# Patient Record
Sex: Male | Born: 1970 | Race: Black or African American | Hispanic: No | Marital: Single | Smoking: Current every day smoker
Health system: Southern US, Community
[De-identification: ages and names within clinical notes are randomized; demographics above are authoritative.]

## PROBLEM LIST (undated history)

## (undated) DIAGNOSIS — F329 Major depressive disorder, single episode, unspecified: Secondary | ICD-10-CM

## (undated) DIAGNOSIS — E1140 Type 2 diabetes mellitus with diabetic neuropathy, unspecified: Secondary | ICD-10-CM

## (undated) DIAGNOSIS — F32A Depression, unspecified: Secondary | ICD-10-CM

## (undated) DIAGNOSIS — F419 Anxiety disorder, unspecified: Secondary | ICD-10-CM

## (undated) DIAGNOSIS — I10 Essential (primary) hypertension: Secondary | ICD-10-CM

## (undated) HISTORY — PX: MOUTH SURGERY: SHX715

## (undated) HISTORY — PX: TYMPANOSTOMY TUBE PLACEMENT: SHX32

---

## 2001-02-11 ENCOUNTER — Emergency Department (HOSPITAL_COMMUNITY): Admission: EM | Admit: 2001-02-11 | Discharge: 2001-02-12 | Payer: Self-pay | Admitting: Emergency Medicine

## 2001-03-08 ENCOUNTER — Emergency Department (HOSPITAL_COMMUNITY): Admission: EM | Admit: 2001-03-08 | Discharge: 2001-03-08 | Payer: Self-pay | Admitting: Emergency Medicine

## 2001-04-26 ENCOUNTER — Emergency Department (HOSPITAL_COMMUNITY): Admission: EM | Admit: 2001-04-26 | Discharge: 2001-04-26 | Payer: Self-pay | Admitting: Emergency Medicine

## 2001-04-26 ENCOUNTER — Encounter: Payer: Self-pay | Admitting: Emergency Medicine

## 2001-05-08 ENCOUNTER — Emergency Department (HOSPITAL_COMMUNITY): Admission: EM | Admit: 2001-05-08 | Discharge: 2001-05-09 | Payer: Self-pay | Admitting: Emergency Medicine

## 2001-09-29 ENCOUNTER — Emergency Department (HOSPITAL_COMMUNITY): Admission: EM | Admit: 2001-09-29 | Discharge: 2001-09-29 | Payer: Self-pay | Admitting: Emergency Medicine

## 2002-03-05 ENCOUNTER — Emergency Department (HOSPITAL_COMMUNITY): Admission: EM | Admit: 2002-03-05 | Discharge: 2002-03-06 | Payer: Self-pay

## 2002-05-27 ENCOUNTER — Emergency Department (HOSPITAL_COMMUNITY): Admission: EM | Admit: 2002-05-27 | Discharge: 2002-05-27 | Payer: Self-pay | Admitting: Emergency Medicine

## 2002-05-30 ENCOUNTER — Emergency Department (HOSPITAL_COMMUNITY): Admission: EM | Admit: 2002-05-30 | Discharge: 2002-05-30 | Payer: Self-pay | Admitting: Emergency Medicine

## 2002-06-18 ENCOUNTER — Encounter: Payer: Self-pay | Admitting: Emergency Medicine

## 2002-06-18 ENCOUNTER — Emergency Department (HOSPITAL_COMMUNITY): Admission: EM | Admit: 2002-06-18 | Discharge: 2002-06-18 | Payer: Self-pay | Admitting: Emergency Medicine

## 2002-07-23 ENCOUNTER — Emergency Department (HOSPITAL_COMMUNITY): Admission: EM | Admit: 2002-07-23 | Discharge: 2002-07-23 | Payer: Self-pay | Admitting: Emergency Medicine

## 2002-09-24 ENCOUNTER — Emergency Department (HOSPITAL_COMMUNITY): Admission: EM | Admit: 2002-09-24 | Discharge: 2002-09-24 | Payer: Self-pay | Admitting: Emergency Medicine

## 2003-01-07 ENCOUNTER — Emergency Department (HOSPITAL_COMMUNITY): Admission: EM | Admit: 2003-01-07 | Discharge: 2003-01-07 | Payer: Self-pay | Admitting: Emergency Medicine

## 2003-02-22 ENCOUNTER — Emergency Department (HOSPITAL_COMMUNITY): Admission: EM | Admit: 2003-02-22 | Discharge: 2003-02-22 | Payer: Self-pay | Admitting: Emergency Medicine

## 2003-02-22 ENCOUNTER — Encounter: Payer: Self-pay | Admitting: Emergency Medicine

## 2003-04-04 ENCOUNTER — Emergency Department (HOSPITAL_COMMUNITY): Admission: EM | Admit: 2003-04-04 | Discharge: 2003-04-04 | Payer: Self-pay | Admitting: Family Medicine

## 2003-06-18 ENCOUNTER — Emergency Department (HOSPITAL_COMMUNITY): Admission: EM | Admit: 2003-06-18 | Discharge: 2003-06-19 | Payer: Self-pay | Admitting: Emergency Medicine

## 2003-07-21 ENCOUNTER — Emergency Department (HOSPITAL_COMMUNITY): Admission: EM | Admit: 2003-07-21 | Discharge: 2003-07-21 | Payer: Self-pay

## 2003-07-30 ENCOUNTER — Emergency Department (HOSPITAL_COMMUNITY): Admission: EM | Admit: 2003-07-30 | Discharge: 2003-07-30 | Payer: Self-pay | Admitting: Emergency Medicine

## 2003-08-05 ENCOUNTER — Emergency Department (HOSPITAL_COMMUNITY): Admission: EM | Admit: 2003-08-05 | Discharge: 2003-08-05 | Payer: Self-pay | Admitting: Emergency Medicine

## 2003-08-19 ENCOUNTER — Emergency Department (HOSPITAL_COMMUNITY): Admission: EM | Admit: 2003-08-19 | Discharge: 2003-08-19 | Payer: Self-pay | Admitting: Emergency Medicine

## 2003-09-24 ENCOUNTER — Emergency Department (HOSPITAL_COMMUNITY): Admission: EM | Admit: 2003-09-24 | Discharge: 2003-09-25 | Payer: Self-pay | Admitting: Emergency Medicine

## 2003-11-20 ENCOUNTER — Emergency Department (HOSPITAL_COMMUNITY): Admission: EM | Admit: 2003-11-20 | Discharge: 2003-11-20 | Payer: Self-pay | Admitting: Emergency Medicine

## 2004-01-01 ENCOUNTER — Emergency Department (HOSPITAL_COMMUNITY): Admission: EM | Admit: 2004-01-01 | Discharge: 2004-01-01 | Payer: Self-pay | Admitting: Emergency Medicine

## 2004-01-26 ENCOUNTER — Emergency Department (HOSPITAL_COMMUNITY): Admission: EM | Admit: 2004-01-26 | Discharge: 2004-01-26 | Payer: Self-pay | Admitting: *Deleted

## 2004-02-07 ENCOUNTER — Emergency Department (HOSPITAL_COMMUNITY): Admission: EM | Admit: 2004-02-07 | Discharge: 2004-02-07 | Payer: Self-pay | Admitting: Emergency Medicine

## 2004-03-04 ENCOUNTER — Emergency Department (HOSPITAL_COMMUNITY): Admission: EM | Admit: 2004-03-04 | Discharge: 2004-03-04 | Payer: Self-pay | Admitting: Emergency Medicine

## 2004-04-15 ENCOUNTER — Emergency Department (HOSPITAL_COMMUNITY): Admission: EM | Admit: 2004-04-15 | Discharge: 2004-04-15 | Payer: Self-pay | Admitting: Emergency Medicine

## 2004-06-02 ENCOUNTER — Emergency Department (HOSPITAL_COMMUNITY): Admission: EM | Admit: 2004-06-02 | Discharge: 2004-06-02 | Payer: Self-pay | Admitting: Emergency Medicine

## 2004-06-23 ENCOUNTER — Emergency Department (HOSPITAL_COMMUNITY): Admission: EM | Admit: 2004-06-23 | Discharge: 2004-06-23 | Payer: Self-pay | Admitting: Emergency Medicine

## 2004-07-20 ENCOUNTER — Emergency Department (HOSPITAL_COMMUNITY): Admission: EM | Admit: 2004-07-20 | Discharge: 2004-07-20 | Payer: Self-pay | Admitting: Emergency Medicine

## 2004-08-01 ENCOUNTER — Emergency Department (HOSPITAL_COMMUNITY): Admission: EM | Admit: 2004-08-01 | Discharge: 2004-08-01 | Payer: Self-pay | Admitting: Emergency Medicine

## 2004-10-03 ENCOUNTER — Emergency Department: Payer: Self-pay | Admitting: Emergency Medicine

## 2005-03-23 ENCOUNTER — Emergency Department (HOSPITAL_COMMUNITY): Admission: EM | Admit: 2005-03-23 | Discharge: 2005-03-23 | Payer: Self-pay | Admitting: Family Medicine

## 2005-03-28 ENCOUNTER — Emergency Department (HOSPITAL_COMMUNITY): Admission: EM | Admit: 2005-03-28 | Discharge: 2005-03-28 | Payer: Self-pay | Admitting: Family Medicine

## 2005-05-11 ENCOUNTER — Emergency Department (HOSPITAL_COMMUNITY): Admission: EM | Admit: 2005-05-11 | Discharge: 2005-05-11 | Payer: Self-pay | Admitting: Family Medicine

## 2005-06-19 ENCOUNTER — Emergency Department (HOSPITAL_COMMUNITY): Admission: EM | Admit: 2005-06-19 | Discharge: 2005-06-19 | Payer: Self-pay | Admitting: Family Medicine

## 2005-06-22 ENCOUNTER — Emergency Department (HOSPITAL_COMMUNITY): Admission: EM | Admit: 2005-06-22 | Discharge: 2005-06-22 | Payer: Self-pay | Admitting: Emergency Medicine

## 2005-06-30 ENCOUNTER — Emergency Department (HOSPITAL_COMMUNITY): Admission: EM | Admit: 2005-06-30 | Discharge: 2005-06-30 | Payer: Self-pay | Admitting: *Deleted

## 2005-07-11 ENCOUNTER — Emergency Department (HOSPITAL_COMMUNITY): Admission: EM | Admit: 2005-07-11 | Discharge: 2005-07-11 | Payer: Self-pay | Admitting: Emergency Medicine

## 2005-07-19 ENCOUNTER — Emergency Department (HOSPITAL_COMMUNITY): Admission: EM | Admit: 2005-07-19 | Discharge: 2005-07-19 | Payer: Self-pay | Admitting: Emergency Medicine

## 2005-07-31 ENCOUNTER — Emergency Department (HOSPITAL_COMMUNITY): Admission: EM | Admit: 2005-07-31 | Discharge: 2005-07-31 | Payer: Self-pay | Admitting: Emergency Medicine

## 2005-08-31 ENCOUNTER — Emergency Department: Payer: Self-pay | Admitting: Emergency Medicine

## 2005-12-05 ENCOUNTER — Emergency Department (HOSPITAL_COMMUNITY): Admission: EM | Admit: 2005-12-05 | Discharge: 2005-12-05 | Payer: Self-pay | Admitting: Emergency Medicine

## 2006-01-18 ENCOUNTER — Emergency Department (HOSPITAL_COMMUNITY): Admission: EM | Admit: 2006-01-18 | Discharge: 2006-01-18 | Payer: Self-pay | Admitting: Emergency Medicine

## 2006-01-31 ENCOUNTER — Emergency Department (HOSPITAL_COMMUNITY): Admission: EM | Admit: 2006-01-31 | Discharge: 2006-01-31 | Payer: Self-pay | Admitting: Emergency Medicine

## 2006-04-17 ENCOUNTER — Emergency Department (HOSPITAL_COMMUNITY): Admission: EM | Admit: 2006-04-17 | Discharge: 2006-04-17 | Payer: Self-pay | Admitting: Emergency Medicine

## 2006-07-17 ENCOUNTER — Emergency Department (HOSPITAL_COMMUNITY): Admission: EM | Admit: 2006-07-17 | Discharge: 2006-07-17 | Payer: Self-pay | Admitting: Emergency Medicine

## 2006-07-25 ENCOUNTER — Emergency Department (HOSPITAL_COMMUNITY): Admission: EM | Admit: 2006-07-25 | Discharge: 2006-07-25 | Payer: Self-pay | Admitting: Emergency Medicine

## 2006-08-30 ENCOUNTER — Emergency Department (HOSPITAL_COMMUNITY): Admission: EM | Admit: 2006-08-30 | Discharge: 2006-08-30 | Payer: Self-pay | Admitting: Emergency Medicine

## 2006-12-12 ENCOUNTER — Emergency Department (HOSPITAL_COMMUNITY): Admission: EM | Admit: 2006-12-12 | Discharge: 2006-12-12 | Payer: Self-pay | Admitting: Emergency Medicine

## 2007-02-12 ENCOUNTER — Emergency Department (HOSPITAL_COMMUNITY): Admission: EM | Admit: 2007-02-12 | Discharge: 2007-02-12 | Payer: Self-pay | Admitting: Emergency Medicine

## 2007-04-18 ENCOUNTER — Emergency Department (HOSPITAL_COMMUNITY): Admission: EM | Admit: 2007-04-18 | Discharge: 2007-04-18 | Payer: Self-pay | Admitting: Emergency Medicine

## 2007-08-25 ENCOUNTER — Emergency Department (HOSPITAL_COMMUNITY): Admission: EM | Admit: 2007-08-25 | Discharge: 2007-08-25 | Payer: Self-pay | Admitting: Emergency Medicine

## 2007-11-02 ENCOUNTER — Emergency Department (HOSPITAL_COMMUNITY): Admission: EM | Admit: 2007-11-02 | Discharge: 2007-11-02 | Payer: Self-pay | Admitting: Emergency Medicine

## 2008-03-03 ENCOUNTER — Emergency Department (HOSPITAL_BASED_OUTPATIENT_CLINIC_OR_DEPARTMENT_OTHER): Admission: EM | Admit: 2008-03-03 | Discharge: 2008-03-03 | Payer: Self-pay | Admitting: Emergency Medicine

## 2008-04-07 ENCOUNTER — Emergency Department (HOSPITAL_COMMUNITY): Admission: EM | Admit: 2008-04-07 | Discharge: 2008-04-07 | Payer: Self-pay | Admitting: *Deleted

## 2008-05-13 ENCOUNTER — Emergency Department (HOSPITAL_BASED_OUTPATIENT_CLINIC_OR_DEPARTMENT_OTHER): Admission: EM | Admit: 2008-05-13 | Discharge: 2008-05-13 | Payer: Self-pay | Admitting: Emergency Medicine

## 2008-05-21 ENCOUNTER — Emergency Department (HOSPITAL_COMMUNITY): Admission: EM | Admit: 2008-05-21 | Discharge: 2008-05-21 | Payer: Self-pay | Admitting: Emergency Medicine

## 2008-08-02 ENCOUNTER — Emergency Department (HOSPITAL_COMMUNITY): Admission: EM | Admit: 2008-08-02 | Discharge: 2008-08-02 | Payer: Self-pay | Admitting: Emergency Medicine

## 2008-08-15 ENCOUNTER — Emergency Department (HOSPITAL_BASED_OUTPATIENT_CLINIC_OR_DEPARTMENT_OTHER): Admission: EM | Admit: 2008-08-15 | Discharge: 2008-08-15 | Payer: Self-pay | Admitting: Emergency Medicine

## 2008-09-02 ENCOUNTER — Emergency Department (HOSPITAL_COMMUNITY): Admission: EM | Admit: 2008-09-02 | Discharge: 2008-09-02 | Payer: Self-pay | Admitting: Emergency Medicine

## 2008-10-11 ENCOUNTER — Emergency Department (HOSPITAL_COMMUNITY): Admission: EM | Admit: 2008-10-11 | Discharge: 2008-10-11 | Payer: Self-pay | Admitting: Emergency Medicine

## 2008-11-27 ENCOUNTER — Emergency Department (HOSPITAL_COMMUNITY): Admission: EM | Admit: 2008-11-27 | Discharge: 2008-11-27 | Payer: Self-pay | Admitting: Emergency Medicine

## 2009-01-20 ENCOUNTER — Emergency Department (HOSPITAL_BASED_OUTPATIENT_CLINIC_OR_DEPARTMENT_OTHER): Admission: EM | Admit: 2009-01-20 | Discharge: 2009-01-20 | Payer: Self-pay | Admitting: Emergency Medicine

## 2009-03-18 ENCOUNTER — Emergency Department (HOSPITAL_COMMUNITY): Admission: EM | Admit: 2009-03-18 | Discharge: 2009-03-18 | Payer: Self-pay | Admitting: Emergency Medicine

## 2009-04-02 ENCOUNTER — Emergency Department (HOSPITAL_BASED_OUTPATIENT_CLINIC_OR_DEPARTMENT_OTHER): Admission: EM | Admit: 2009-04-02 | Discharge: 2009-04-02 | Payer: Self-pay | Admitting: Emergency Medicine

## 2009-04-02 ENCOUNTER — Ambulatory Visit: Payer: Self-pay | Admitting: Radiology

## 2009-04-10 ENCOUNTER — Emergency Department (HOSPITAL_COMMUNITY): Admission: EM | Admit: 2009-04-10 | Discharge: 2009-04-10 | Payer: Self-pay | Admitting: Emergency Medicine

## 2009-06-11 ENCOUNTER — Emergency Department (HOSPITAL_COMMUNITY): Admission: EM | Admit: 2009-06-11 | Discharge: 2009-06-11 | Payer: Self-pay | Admitting: Emergency Medicine

## 2009-08-18 ENCOUNTER — Ambulatory Visit: Payer: Self-pay | Admitting: Diagnostic Radiology

## 2009-08-18 ENCOUNTER — Emergency Department (HOSPITAL_BASED_OUTPATIENT_CLINIC_OR_DEPARTMENT_OTHER): Admission: EM | Admit: 2009-08-18 | Discharge: 2009-08-18 | Payer: Self-pay | Admitting: Emergency Medicine

## 2009-09-06 ENCOUNTER — Emergency Department (HOSPITAL_BASED_OUTPATIENT_CLINIC_OR_DEPARTMENT_OTHER): Admission: EM | Admit: 2009-09-06 | Discharge: 2009-09-06 | Payer: Self-pay | Admitting: Emergency Medicine

## 2009-09-06 ENCOUNTER — Ambulatory Visit: Payer: Self-pay | Admitting: Diagnostic Radiology

## 2010-01-15 ENCOUNTER — Emergency Department (HOSPITAL_COMMUNITY): Admission: EM | Admit: 2010-01-15 | Discharge: 2010-01-15 | Payer: Self-pay | Admitting: Emergency Medicine

## 2010-02-20 ENCOUNTER — Emergency Department (HOSPITAL_BASED_OUTPATIENT_CLINIC_OR_DEPARTMENT_OTHER): Admission: EM | Admit: 2010-02-20 | Discharge: 2010-02-20 | Payer: Self-pay | Admitting: Emergency Medicine

## 2010-02-20 ENCOUNTER — Ambulatory Visit: Payer: Self-pay | Admitting: Diagnostic Radiology

## 2010-03-23 ENCOUNTER — Emergency Department (HOSPITAL_COMMUNITY): Admission: EM | Admit: 2010-03-23 | Discharge: 2010-03-23 | Payer: Self-pay | Admitting: Emergency Medicine

## 2010-08-20 LAB — POCT CARDIAC MARKERS
CKMB, poc: <1 ng/mL — ABNORMAL LOW (ref 1.0–8.0)
Myoglobin, poc: 60.2 ng/mL (ref 12–200)
Troponin i, poc: <0.05 ng/mL (ref 0.00–0.09)

## 2010-08-21 LAB — URINE MICROSCOPIC-ADD ON

## 2010-08-21 LAB — URINALYSIS, ROUTINE W REFLEX MICROSCOPIC
Bilirubin Urine: NEGATIVE
Leukocytes, UA: NEGATIVE
Nitrite: NEGATIVE
Specific Gravity, Urine: 1.029 (ref 1.005–1.030)

## 2010-08-22 LAB — BASIC METABOLIC PANEL
BUN: 15 mg/dL (ref 6–23)
CO2: 28 mEq/L (ref 19–32)
Creatinine, Ser: 1.07 mg/dL (ref 0.4–1.5)
GFR calc Af Amer: >60 mL/min (ref >60)

## 2010-08-22 LAB — TRICYCLICS SCREEN, URINE: TCA Scrn: NOT DETECTED

## 2010-08-22 LAB — DIFFERENTIAL
Basophils Relative: 1 % (ref 0–1)
Lymphocytes Relative: 52 % — ABNORMAL HIGH (ref 12–46)
Monocytes Absolute: 0.3 10*3/uL (ref 0.1–1.0)
Neutro Abs: 2.4 10*3/uL (ref 1.7–7.7)

## 2010-08-22 LAB — CBC
MCH: 29.3 pg (ref 26.0–34.0)
MCV: 83.2 fL (ref 78.0–100.0)
Platelets: 234 10*3/uL (ref 150–400)
RDW: 13.2 % (ref 11.5–15.5)

## 2010-08-22 LAB — RAPID URINE DRUG SCREEN, HOSP PERFORMED
Amphetamines: NOT DETECTED
Barbiturates: NOT DETECTED

## 2010-08-24 LAB — COMPREHENSIVE METABOLIC PANEL
ALT: 66 U/L — ABNORMAL HIGH (ref 0–53)
AST: 38 U/L — ABNORMAL HIGH (ref 0–37)
Albumin: 4.3 g/dL (ref 3.5–5.2)
Alkaline Phosphatase: 81 U/L (ref 39–117)
Calcium: 9.6 mg/dL (ref 8.4–10.5)
Glucose, Bld: 125 mg/dL — ABNORMAL HIGH (ref 70–99)
Sodium: 143 mEq/L (ref 135–145)
Total Bilirubin: 1 mg/dL (ref 0.3–1.2)
Total Protein: 7.9 g/dL (ref 6.0–8.3)

## 2010-08-24 LAB — DIFFERENTIAL
Lymphocytes Relative: 43 % (ref 12–46)
Monocytes Relative: 4 % (ref 3–12)
Neutro Abs: 2.8 10*3/uL (ref 1.7–7.7)
Neutrophils Relative %: 50 % (ref 43–77)

## 2010-08-24 LAB — CBC
HCT: 47.5 % (ref 39.0–52.0)
MCV: 86.4 fL (ref 78.0–100.0)
Platelets: 237 10*3/uL (ref 150–400)
RBC: 5.5 MIL/uL (ref 4.22–5.81)
RDW: 12.1 % (ref 11.5–15.5)
WBC: 5.7 10*3/uL (ref 4.0–10.5)

## 2010-08-24 LAB — URINALYSIS, ROUTINE W REFLEX MICROSCOPIC
Glucose, UA: NEGATIVE mg/dL
Leukocytes, UA: NEGATIVE
Protein, ur: NEGATIVE mg/dL
Urobilinogen, UA: 0.2 mg/dL (ref 0.0–1.0)
pH: 6 (ref 5.0–8.0)

## 2010-08-24 LAB — LIPASE, BLOOD: Lipase: 23 U/L (ref 11–59)

## 2010-08-27 LAB — URINALYSIS, ROUTINE W REFLEX MICROSCOPIC
Bilirubin Urine: NEGATIVE
Glucose, UA: NEGATIVE mg/dL
Ketones, ur: NEGATIVE mg/dL
Leukocytes, UA: NEGATIVE
Protein, ur: NEGATIVE mg/dL
Urobilinogen, UA: 0.2 mg/dL (ref 0.0–1.0)

## 2010-08-27 LAB — COMPREHENSIVE METABOLIC PANEL
AST: 40 U/L — ABNORMAL HIGH (ref 0–37)
Albumin: 4.4 g/dL (ref 3.5–5.2)
CO2: 28 mEq/L (ref 19–32)
Calcium: 9.9 mg/dL (ref 8.4–10.5)
Creatinine, Ser: 1 mg/dL (ref 0.4–1.5)
GFR calc non Af Amer: >60 mL/min (ref >60)
Sodium: 145 mEq/L (ref 135–145)
Total Bilirubin: 1.2 mg/dL (ref 0.3–1.2)

## 2010-08-27 LAB — URINE MICROSCOPIC-ADD ON

## 2010-09-11 LAB — CBC
Hemoglobin: 14.6 g/dL (ref 13.0–17.0)
Platelets: 216 10*3/uL (ref 150–400)
RDW: 12.6 % (ref 11.5–15.5)

## 2010-09-11 LAB — COMPREHENSIVE METABOLIC PANEL
ALT: 45 U/L (ref 0–53)
AST: 43 U/L — ABNORMAL HIGH (ref 0–37)
Albumin: 4 g/dL (ref 3.5–5.2)
Alkaline Phosphatase: 71 U/L (ref 39–117)
GFR calc Af Amer: >60 mL/min (ref >60)
Glucose, Bld: 119 mg/dL — ABNORMAL HIGH (ref 70–99)
Potassium: 4.1 mEq/L (ref 3.5–5.1)
Sodium: 139 mEq/L (ref 135–145)
Total Protein: 7 g/dL (ref 6.0–8.3)

## 2010-09-11 LAB — URINALYSIS, ROUTINE W REFLEX MICROSCOPIC
Bilirubin Urine: NEGATIVE
Protein, ur: NEGATIVE mg/dL
Urobilinogen, UA: 0.2 mg/dL (ref 0.0–1.0)
pH: 6 (ref 5.0–8.0)

## 2010-09-11 LAB — DIFFERENTIAL
Basophils Relative: 1 % (ref 0–1)
Eosinophils Absolute: 0.1 10*3/uL (ref 0.0–0.7)
Eosinophils Relative: 2 % (ref 0–5)
Monocytes Absolute: 0.3 10*3/uL (ref 0.1–1.0)
Monocytes Relative: 6 % (ref 3–12)

## 2010-09-11 LAB — URINE MICROSCOPIC-ADD ON

## 2010-09-11 LAB — LIPASE, BLOOD: Lipase: 14 U/L (ref 11–59)

## 2010-09-13 LAB — URINALYSIS, ROUTINE W REFLEX MICROSCOPIC
Bilirubin Urine: NEGATIVE
Leukocytes, UA: NEGATIVE
Nitrite: NEGATIVE
Specific Gravity, Urine: 1.022 (ref 1.005–1.030)
Urobilinogen, UA: 0.2 mg/dL (ref 0.0–1.0)
pH: 6 (ref 5.0–8.0)

## 2010-09-13 LAB — DIFFERENTIAL
Eosinophils Relative: 2 % (ref 0–5)
Lymphocytes Relative: 34 % (ref 12–46)
Lymphs Abs: 2.1 10*3/uL (ref 0.7–4.0)
Neutro Abs: 3.8 10*3/uL (ref 1.7–7.7)

## 2010-09-13 LAB — COMPREHENSIVE METABOLIC PANEL
AST: 33 U/L (ref 0–37)
Albumin: 4.8 g/dL (ref 3.5–5.2)
BUN: 8 mg/dL (ref 6–23)
CO2: 32 mEq/L (ref 19–32)
Calcium: 10.4 mg/dL (ref 8.4–10.5)
Creatinine, Ser: 0.9 mg/dL (ref 0.4–1.5)
GFR calc Af Amer: >60 mL/min (ref >60)
GFR calc non Af Amer: >60 mL/min (ref >60)

## 2010-09-13 LAB — URINE MICROSCOPIC-ADD ON

## 2010-09-13 LAB — CBC
HCT: 49.8 % (ref 39.0–52.0)
MCHC: 33.2 g/dL (ref 30.0–36.0)
MCV: 87.7 fL (ref 78.0–100.0)
Platelets: 213 10*3/uL (ref 150–400)

## 2010-09-13 LAB — LIPASE, BLOOD: Lipase: 38 U/L (ref 23–300)

## 2010-09-15 LAB — DIFFERENTIAL
Eosinophils Absolute: 0.2 10*3/uL (ref 0.0–0.7)
Eosinophils Relative: 3 % (ref 0–5)
Lymphocytes Relative: 41 % (ref 12–46)
Lymphs Abs: 2.1 10*3/uL (ref 0.7–4.0)
Monocytes Relative: 5 % (ref 3–12)

## 2010-09-15 LAB — URINALYSIS, ROUTINE W REFLEX MICROSCOPIC
Bilirubin Urine: NEGATIVE
Glucose, UA: 100 mg/dL — AB
Ketones, ur: NEGATIVE mg/dL
Leukocytes, UA: NEGATIVE
Nitrite: NEGATIVE
Specific Gravity, Urine: 1.036 — ABNORMAL HIGH (ref 1.005–1.030)
pH: 5.5 (ref 5.0–8.0)

## 2010-09-15 LAB — CBC
MCHC: 32.7 g/dL (ref 30.0–36.0)
MCV: 85.9 fL (ref 78.0–100.0)
Platelets: 236 10*3/uL (ref 150–400)
RBC: 5.39 MIL/uL (ref 4.22–5.81)
RDW: 12.9 % (ref 11.5–15.5)

## 2010-09-15 LAB — COMPREHENSIVE METABOLIC PANEL
ALT: 37 U/L (ref 0–53)
AST: 38 U/L — ABNORMAL HIGH (ref 0–37)
Albumin: 4.2 g/dL (ref 3.5–5.2)
Calcium: 9.3 mg/dL (ref 8.4–10.5)
Creatinine, Ser: 0.94 mg/dL (ref 0.4–1.5)
GFR calc Af Amer: >60 mL/min (ref >60)
GFR calc non Af Amer: >60 mL/min (ref >60)
Sodium: 141 mEq/L (ref 135–145)
Total Protein: 7.7 g/dL (ref 6.0–8.3)

## 2010-09-15 LAB — URINE MICROSCOPIC-ADD ON

## 2010-09-15 LAB — LIPASE, BLOOD: Lipase: 21 U/L (ref 11–59)

## 2010-09-23 LAB — URINALYSIS, ROUTINE W REFLEX MICROSCOPIC
Bilirubin Urine: NEGATIVE
Leukocytes, UA: NEGATIVE
Nitrite: NEGATIVE
Specific Gravity, Urine: 1.033 — ABNORMAL HIGH (ref 1.005–1.030)
Urobilinogen, UA: 1 mg/dL (ref 0.0–1.0)

## 2010-09-23 LAB — COMPREHENSIVE METABOLIC PANEL
BUN: 6 mg/dL (ref 6–23)
CO2: 24 mEq/L (ref 19–32)
Calcium: 9.1 mg/dL (ref 8.4–10.5)
Chloride: 105 mEq/L (ref 96–112)
Creatinine, Ser: 0.76 mg/dL (ref 0.4–1.5)
GFR calc non Af Amer: >60 mL/min (ref >60)
Glucose, Bld: 106 mg/dL — ABNORMAL HIGH (ref 70–99)
Total Bilirubin: 1.4 mg/dL — ABNORMAL HIGH (ref 0.3–1.2)

## 2010-09-23 LAB — CBC
HCT: 47.6 % (ref 39.0–52.0)
MCHC: 34.5 g/dL (ref 30.0–36.0)
MCV: 85.9 fL (ref 78.0–100.0)
RBC: 5.54 MIL/uL (ref 4.22–5.81)
WBC: 5.9 10*3/uL (ref 4.0–10.5)

## 2010-09-23 LAB — DIFFERENTIAL
Basophils Absolute: 0 10*3/uL (ref 0.0–0.1)
Eosinophils Relative: 1 % (ref 0–5)
Lymphocytes Relative: 30 % (ref 12–46)
Neutro Abs: 3.8 10*3/uL (ref 1.7–7.7)
Neutrophils Relative %: 65 % (ref 43–77)

## 2010-09-23 LAB — URINE MICROSCOPIC-ADD ON

## 2010-09-23 LAB — LIPASE, BLOOD: Lipase: 19 U/L (ref 11–59)

## 2011-03-09 LAB — URINALYSIS, ROUTINE W REFLEX MICROSCOPIC
Glucose, UA: 100 — AB
Ketones, ur: NEGATIVE
Leukocytes, UA: NEGATIVE
pH: 6

## 2011-03-09 LAB — URINE MICROSCOPIC-ADD ON

## 2011-03-12 LAB — CBC
HCT: 46 % (ref 39.0–52.0)
Hemoglobin: 15.4 g/dL (ref 13.0–17.0)
MCHC: 33.4 g/dL (ref 30.0–36.0)
MCV: 86.8 fL (ref 78.0–100.0)
Platelets: 235 10*3/uL (ref 150–400)
RBC: 5.3 MIL/uL (ref 4.22–5.81)
RDW: 13 % (ref 11.5–15.5)
WBC: 5.6 10*3/uL (ref 4.0–10.5)

## 2011-03-12 LAB — DIFFERENTIAL
Basophils Absolute: 0 K/uL (ref 0.0–0.1)
Basophils Relative: 0 % (ref 0–1)
Eosinophils Absolute: 0.3 10*3/uL (ref 0.0–0.7)
Eosinophils Relative: 5 % (ref 0–5)
Lymphocytes Relative: 51 % — ABNORMAL HIGH (ref 12–46)
Lymphs Abs: 2.8 10*3/uL (ref 0.7–4.0)
Monocytes Absolute: 0.4 K/uL (ref 0.1–1.0)
Monocytes Relative: 7 % (ref 3–12)
Neutro Abs: 2.1 K/uL (ref 1.7–7.7)
Neutrophils Relative %: 38 % — ABNORMAL LOW (ref 43–77)

## 2011-03-26 ENCOUNTER — Emergency Department (INDEPENDENT_AMBULATORY_CARE_PROVIDER_SITE_OTHER): Payer: No Typology Code available for payment source

## 2011-03-26 ENCOUNTER — Emergency Department (HOSPITAL_BASED_OUTPATIENT_CLINIC_OR_DEPARTMENT_OTHER)
Admission: EM | Admit: 2011-03-26 | Discharge: 2011-03-26 | Disposition: A | Discharge status: Home / Self Care | Payer: No Typology Code available for payment source | Attending: Emergency Medicine | Admitting: Emergency Medicine

## 2011-03-26 ENCOUNTER — Encounter: Payer: Self-pay | Admitting: Family Medicine

## 2011-03-26 DIAGNOSIS — S139XXA Sprain of joints and ligaments of unspecified parts of neck, initial encounter: Secondary | ICD-10-CM | POA: Insufficient documentation

## 2011-03-26 DIAGNOSIS — M542 Cervicalgia: Secondary | ICD-10-CM

## 2011-03-26 DIAGNOSIS — Y92410 Unspecified street and highway as the place of occurrence of the external cause: Secondary | ICD-10-CM | POA: Insufficient documentation

## 2011-03-26 DIAGNOSIS — S161XXA Strain of muscle, fascia and tendon at neck level, initial encounter: Secondary | ICD-10-CM

## 2011-03-26 DIAGNOSIS — E119 Type 2 diabetes mellitus without complications: Secondary | ICD-10-CM | POA: Insufficient documentation

## 2011-03-26 MED ORDER — IBUPROFEN 600 MG PO TABS: 600.0000 mg | ORAL_TABLET | Freq: Four times a day (QID) | ORAL | Status: AC | PRN

## 2011-03-26 MED ORDER — CYCLOBENZAPRINE HCL 10 MG PO TABS: 10.0000 mg | ORAL_TABLET | Freq: Two times a day (BID) | ORAL | Status: AC | PRN
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
## 2011-06-28 IMAGING — CR DG CHEST 2V
2 series · 2 of 2 positions shown · non-contrast
Comparison: 05/21/2008

CLINICAL DATA: Shortness of breath and congestion.

CHEST - 2 VIEW

[w chest pa]
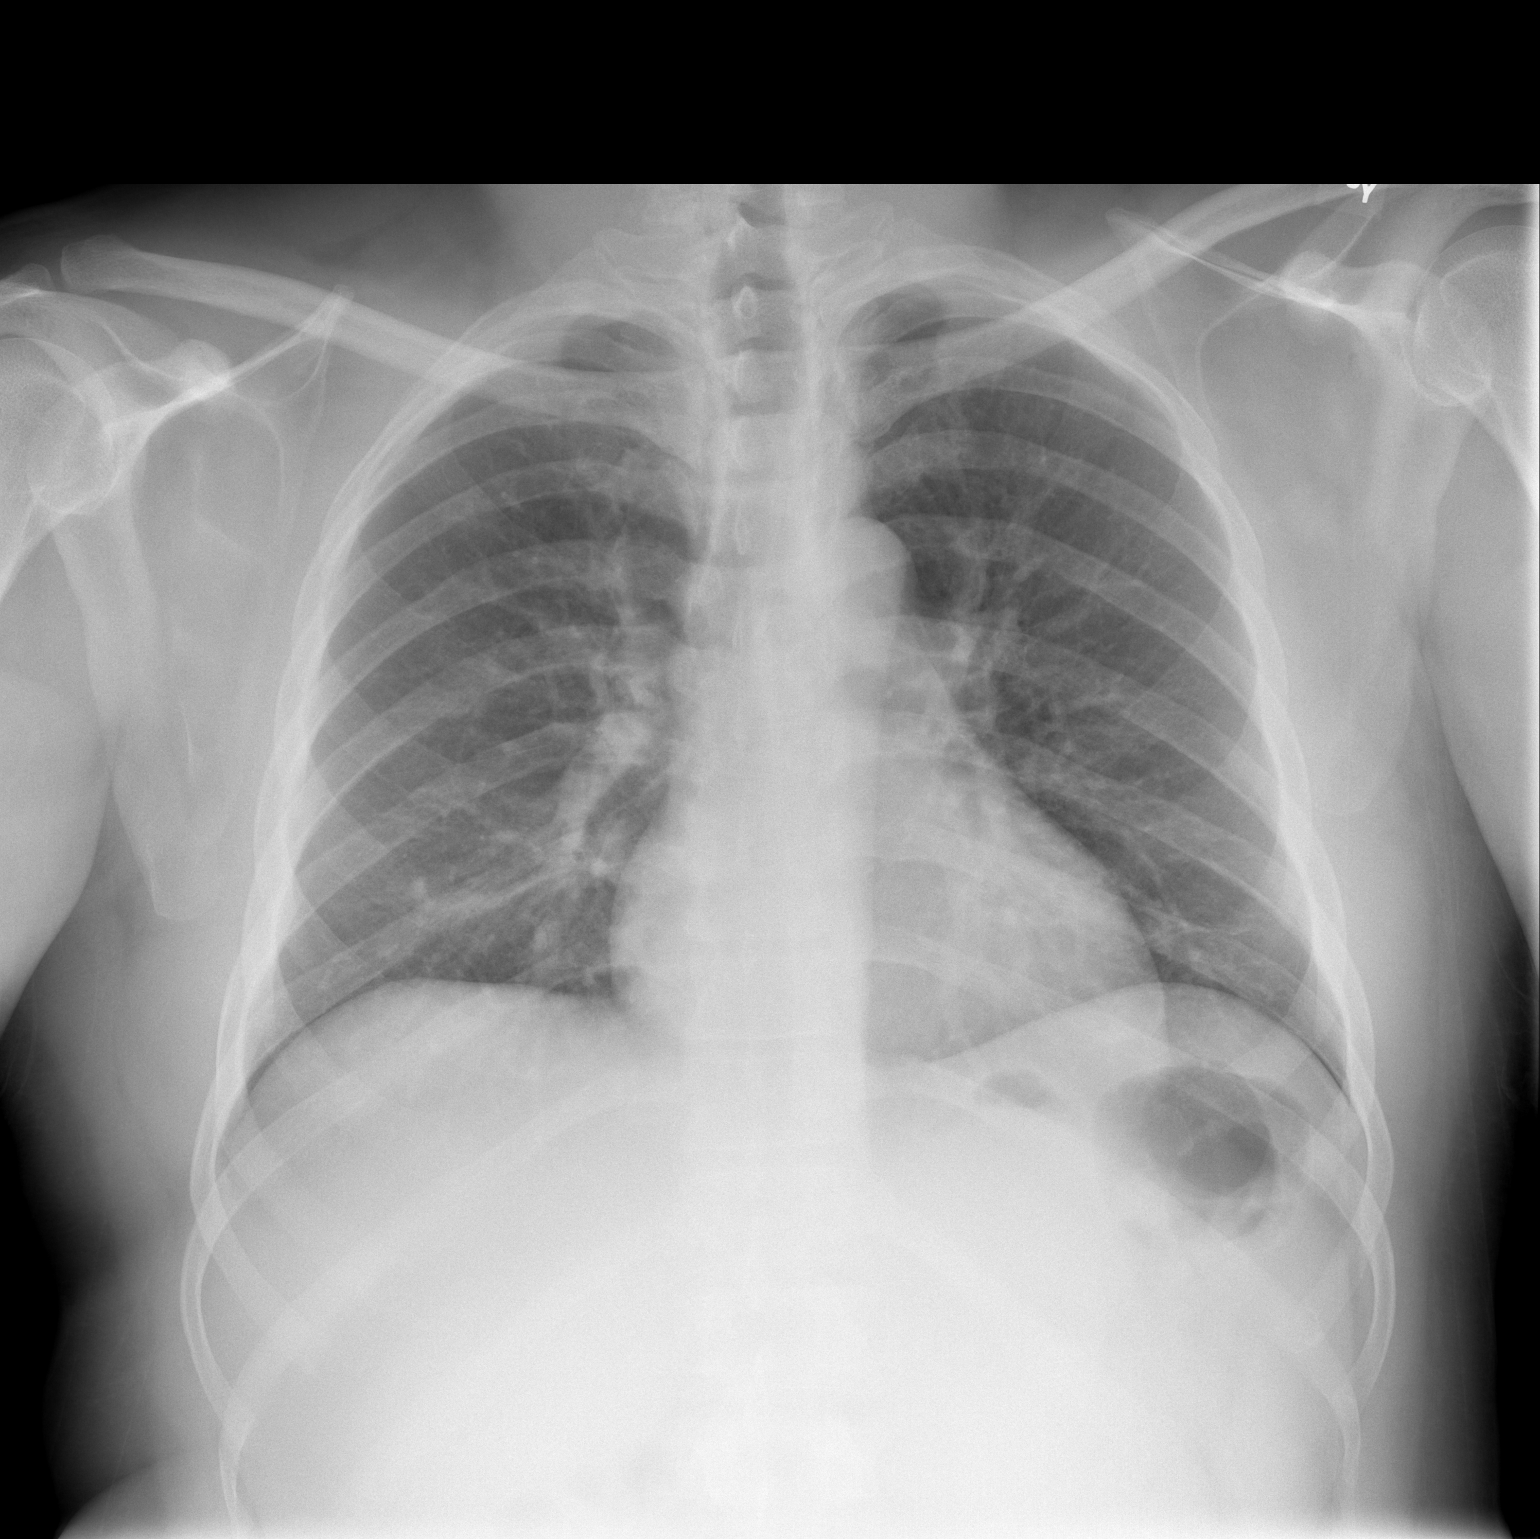

[w chest lat]
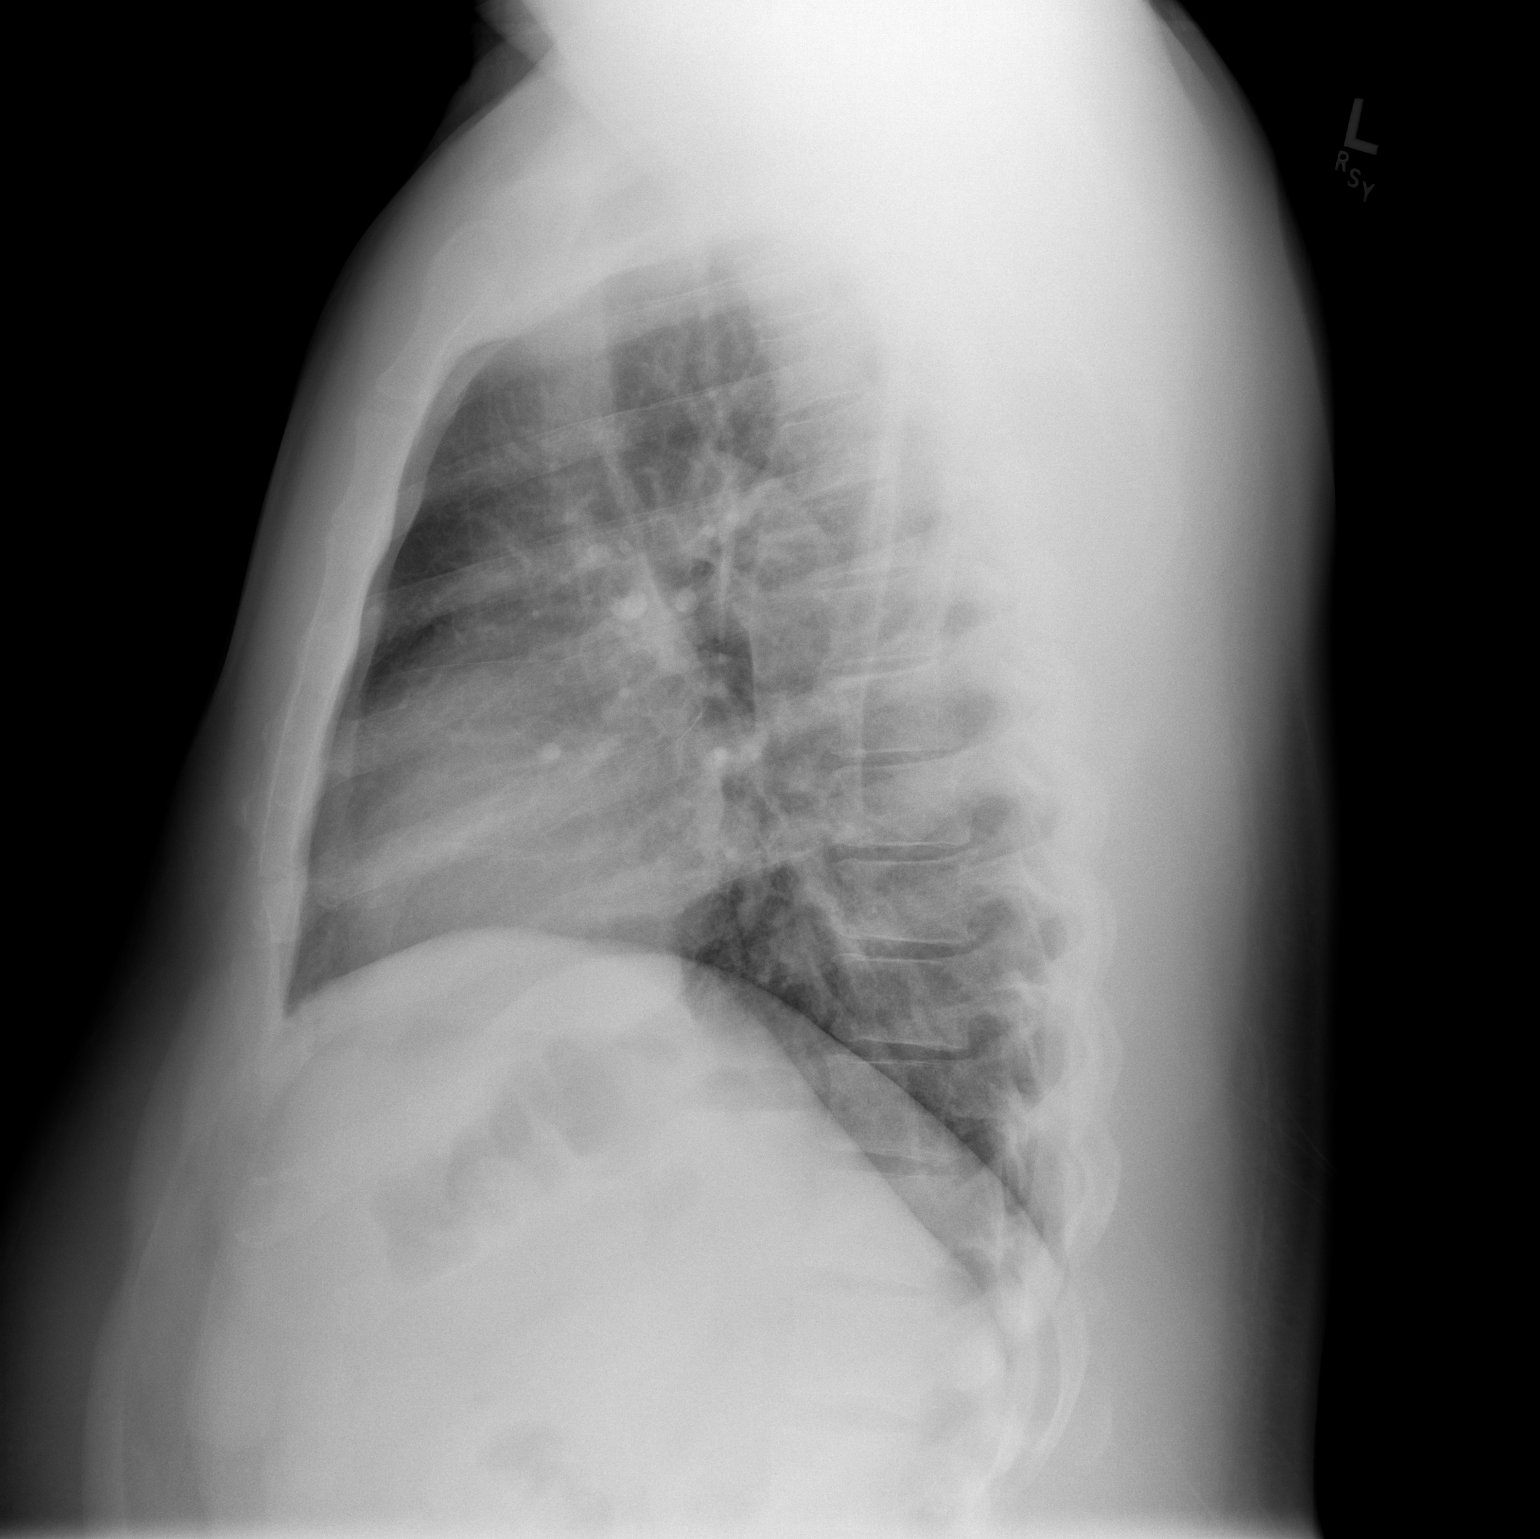

[2 of 2 positions shown; findings below may reference images not displayed]

FINDINGS: The cardiac silhouette, mediastinal hilar contours are
within normal limits and stable.  The lungs are clear.  The bony
thorax is intact.
IMPRESSION: Normal chest.  No change since prior study.

## 2011-09-13 ENCOUNTER — Encounter (HOSPITAL_BASED_OUTPATIENT_CLINIC_OR_DEPARTMENT_OTHER): Payer: Self-pay | Admitting: *Deleted

## 2011-09-13 ENCOUNTER — Emergency Department (HOSPITAL_BASED_OUTPATIENT_CLINIC_OR_DEPARTMENT_OTHER)
Admission: EM | Admit: 2011-09-13 | Discharge: 2011-09-14 | Disposition: A | Discharge status: Home / Self Care | Payer: Self-pay | Attending: Emergency Medicine | Admitting: Emergency Medicine

## 2011-09-13 DIAGNOSIS — E119 Type 2 diabetes mellitus without complications: Secondary | ICD-10-CM | POA: Insufficient documentation

## 2011-09-13 DIAGNOSIS — K029 Dental caries, unspecified: Secondary | ICD-10-CM | POA: Insufficient documentation

## 2011-09-13 DIAGNOSIS — K0889 Other specified disorders of teeth and supporting structures: Secondary | ICD-10-CM

## 2011-09-13 NOTE — ED Notes (Signed)
Pt with right lower tooth pain that began on Thursday

## 2011-09-14 MED ORDER — HYDROCODONE-ACETAMINOPHEN 5-500 MG PO TABS: 1.0000 | ORAL_TABLET | Freq: Four times a day (QID) | ORAL | Status: AC | PRN

## 2011-09-14 MED ORDER — PENICILLIN V POTASSIUM 500 MG PO TABS: 500.0000 mg | ORAL_TABLET | Freq: Three times a day (TID) | ORAL | Status: AC

## 2011-09-14 NOTE — ED Provider Notes (Signed)
History   This chart was scribed for Geoffery Lyons, MD by Charolett Bumpers . The patient was seen in room MH03/MH03 and the patient's care was started at 12:08pm.   CSN: [id number]  Arrival date & time 09/13/11  2255   First MD Initiated Contact with Patient 09/14/11 0003      Chief Complaint  Patient presents with  . Dental Pain    (Consider location/radiation/quality/duration/timing/severity/associated sxs/prior treatment) HPI Richard [name] is a 41 y.o. male who presents to the Emergency Department complaining of constant, moderate right lower dental pain for the past 3 days. Patient reports a h/o similar problems. Patient denies fever. Patient states that the dental pain is aggravated with eating hot and cold. Patient denies having a filling at the teeth in question. Patient reports that he does not have a dentist.    Past Medical History  Diagnosis Date  . Diabetes mellitus     Past Surgical History  Procedure Date  . Mouth surgery     History reviewed. No pertinent family history.  History  Substance Use Topics  . Smoking status: Never Smoker   . Smokeless tobacco: Not on file  . Alcohol Use: No      Review of Systems A complete 10 system review of systems was obtained and all systems are negative except as noted in the HPI and PMH.   Allergies  Review of patient's allergies indicates no known allergies.  Home Medications   Current Outpatient Rx  Name Route Sig Dispense Refill  . METFORMIN HCL 500 MG PO TABS Oral Take 500 mg by mouth 2 (two) times daily with a meal.      . PRESCRIPTION MEDICATION Oral Take 1 tablet by mouth daily. Blood pressure medication      BP 137/85  Pulse 93  Temp(Src) 98.5 F (36.9 C) (Oral)  Resp 16  SpO2 99%  Physical Exam  Nursing note and vitals reviewed. Constitutional: He is oriented to person, place, and time. He appears well-developed and well-nourished. No distress.  HENT:  Head: Normocephalic and atraumatic.    Right Ear: External ear normal.  Left Ear: External ear normal.  Nose: Nose normal.       Bottom, right rear 2nd and 3rd molar with significant decay. Multiple caries noted. Erythema of surrounding gum tissue.   Eyes: Conjunctivae and EOM are normal. Pupils are equal, round, and reactive to light.  Neck: Normal range of motion. Neck supple. No tracheal deviation present.  Cardiovascular: Normal rate.   Pulmonary/Chest: Effort normal. No respiratory distress.  Abdominal: Soft. He exhibits no distension.  Musculoskeletal: Normal range of motion. He exhibits no edema.  Neurological: He is alert and oriented to person, place, and time. No sensory deficit.  Skin: Skin is warm and dry.  Psychiatric: He has a normal mood and affect. His behavior is normal.    ED Course  Procedures (including critical care time)  DIAGNOSTIC STUDIES: Oxygen Saturation is 99% on room air, normal by my interpretation.    COORDINATION OF CARE:  0011: Discussed planned course of treatment with the patient who is agreeable at this time. Discussed f/u with dentist.    Labs Reviewed - No data to display No results found.   No diagnosis found.    MDM  Will treat with antibiotics and pain meds.  Needs to see dentist.   I personally performed the services described in this documentation, which was scribed in my presence. The recorded information has been reviewed and considered.  Geoffery Lyons, MD 09/14/11 [phone]

## 2011-09-14 NOTE — Discharge Instructions (Signed)
Dental Pain  A tooth ache may be caused by cavities (tooth decay). Cavities expose the nerve of the tooth to air and hot or cold temperatures. It may come from an infection or abscess (also called a boil or furuncle) around your tooth. It is also often caused by dental caries (tooth decay). This causes the pain you are having.  DIAGNOSIS   Your caregiver can diagnose this problem by exam.  TREATMENT   · If caused by an infection, it may be treated with medications which kill germs (antibiotics) and pain medications as prescribed by your caregiver. Take medications as directed.  · Only take over-the-counter or prescription medicines for pain, discomfort, or fever as directed by your caregiver.  · Whether the tooth ache today is caused by infection or dental disease, you should see your dentist as soon as possible for further care.  SEEK MEDICAL CARE IF:  The exam and treatment you received today has been provided on an emergency basis only. This is not a substitute for complete medical or dental care. If your problem worsens or new problems (symptoms) appear, and you are unable to meet with your dentist, call or return to this location.  SEEK IMMEDIATE MEDICAL CARE IF:   · You have a fever.  · You develop redness and swelling of your face, jaw, or neck.  · You are unable to open your mouth.  · You have severe pain uncontrolled by pain medicine.  MAKE SURE YOU:   · Understand these instructions.  · Will watch your condition.  · Will get help right away if you are not doing well or get worse.  Document Released: 05/25/2005 Document Revised: 05/14/2011 Document Reviewed: 01/11/2008  Exit[employer]® Patient Information ©2012 [employer].

## 2011-09-14 NOTE — ED Notes (Signed)
Pt presents to ED today with tooth pain for the last several days.  Pt does not have a dentist

## 2011-11-22 ENCOUNTER — Emergency Department (HOSPITAL_COMMUNITY)
Admission: EM | Admit: 2011-11-22 | Discharge: 2011-11-22 | Disposition: A | Discharge status: Home / Self Care | Payer: Self-pay | Attending: Emergency Medicine | Admitting: Emergency Medicine

## 2011-11-22 ENCOUNTER — Encounter (HOSPITAL_COMMUNITY): Payer: Self-pay | Admitting: Emergency Medicine

## 2011-11-22 DIAGNOSIS — I10 Essential (primary) hypertension: Secondary | ICD-10-CM | POA: Insufficient documentation

## 2011-11-22 DIAGNOSIS — Z79899 Other long term (current) drug therapy: Secondary | ICD-10-CM | POA: Insufficient documentation

## 2011-11-22 DIAGNOSIS — K029 Dental caries, unspecified: Secondary | ICD-10-CM | POA: Insufficient documentation

## 2011-11-22 DIAGNOSIS — E119 Type 2 diabetes mellitus without complications: Secondary | ICD-10-CM | POA: Insufficient documentation

## 2011-11-22 HISTORY — DX: Essential (primary) hypertension: I10

## 2011-11-22 MED ORDER — TRAMADOL HCL 50 MG PO TABS
50.0000 mg | ORAL_TABLET | Freq: Once | ORAL | Status: AC
  Administered 2011-11-22: 50 mg via ORAL
  Filled 2011-11-22: qty 1

## 2011-11-22 MED ORDER — TRAMADOL HCL 50 MG PO TABS
ORAL_TABLET | ORAL | Status: AC
  Filled 2011-11-22: qty 1

## 2011-11-22 MED ORDER — TRAMADOL HCL 50 MG PO TABS: 50.0000 mg | ORAL_TABLET | Freq: Four times a day (QID) | ORAL | Status: AC | PRN

## 2011-11-22 NOTE — ED Provider Notes (Signed)
Medical screening examination/treatment/procedure(s) were performed by non-physician practitioner and as supervising physician I was immediately available for consultation/collaboration.  Ritu Gagliardo M Marnae Madani, MD 11/22/11 0742 

## 2011-11-22 NOTE — Discharge Instructions (Signed)
Dental Caries  Tooth decay (dental caries, cavities) is the most common of all oral diseases. It occurs in all ages but is more common in children and young adults.  CAUSES  Bacteria in your mouth combine with foods (particularly sugars and starches) to produce plaque. Plaque is a substance that sticks to the hard surfaces of teeth. The bacteria in the plaque produce acids that attack the enamel of teeth. Repeated acid attacks dissolve the enamel and create holes in the teeth. Root surfaces of teeth may also get these holes.  Other contributing factors include:   Frequent snacking and drinking of cavity-producing foods and liquids.   Poor oral hygiene.   Dry mouth.   Substance abuse such as methamphetamine.   Broken or poor fitting dental restorations.   Eating disorders.   Gastroesophageal reflux disease (GERD).   Certain radiation treatments to the head and neck.  SYMPTOMS  At first, dental decay appears as white, chalky areas on the enamel. In this early stage, symptoms are seldom present. As the decay progresses, pits and holes may appear on the enamel surfaces. Progression of the decay will lead to softening of the hard layers of the tooth. At this point you may experience some pain or achy feeling after sweet, hot, or cold foods or drinks are consumed. If left untreated, the decay will reach the internal structures of the tooth and produce severe pain. Extensive dental treatment, such as root canal therapy, may be needed to save the tooth at this late stage of decay development.  DIAGNOSIS  Most cavities will be detected during regular check-ups. A thorough medical and dental history will be taken by the dentist. The dentist will use instruments to check the surfaces of your teeth for any breakdown or discoloration. Some dentists have special instruments, such as lasers, that detect tooth decay. Dental X-rays may also show some cavities that are not visible to the eye (such as between  the contact areas of the teeth). TREATMENT  Treatment involves removal of the tooth decay and replacement with a restorative material such as silver, gold, or composite (white) material. However, if the decay involves a large area of the tooth and there is little remaining healthy tooth structure, a cap (crown) will be fitted over the remaining structure. If the decay involves the center part of the tooth (pulp), root canal treatment will be needed before any type of dental restoration is placed. If the tooth is severely destroyed by the decay process, leaving the remaining tooth structures unrestorable, the tooth will need to be pulled (extracted). Some early tooth decay may be reversed by fluoride treatments and thorough brushing and flossing at home. PREVENTION   Eat healthy foods. Restrict the amount of sugary, starchy foods and liquids you consume. Avoid frequent snacking and drinking of unhealthy foods and liquids.   Sealants can help with prevention of cavities. Sealants are composite resins applied onto the biting surfaces of teeth at risk for decay. They smooth out the pits and grooves and prevent food from being trapped in them. This is done in early childhood before tooth decay has started.   Fluoride tablets may also be prescribed to children between 6 months and 10 years of age if your drinking water is not fluoridated. The fluoride absorbed by the tooth enamel makes teeth less susceptible to decay. Thorough daily cleaning with a toothbrush and dental floss is the best way to prevent cavities. Use of a fluoride toothpaste is highly recommended. Fluoride mouth rinses   may be used in specific cases.   Topical application of fluoride by your dentist is important in children.   Regular visits with a dentist for checkups and cleanings are also important.  SEEK IMMEDIATE DENTAL CARE IF:  You have a fever.   You develop redness and swelling of your face, jaw, or neck.   You develop swelling  around a tooth.   You are unable to open your mouth or cannot swallow.   You have severe pain uncontrolled by pain medicine.  Document Released: 02/14/2002 Document Revised: 05/14/2011 Document Reviewed: 10/30/2010 [facility] Patient Information 2012 [place], [place]. Call the dentist first thing Monday morning or you will loose the opportunity to be seen by this dentist ' As discussed buy a jar of temporary dental filling form the pharmacy and fill the cavity with this product    RESOURCE GUIDE  Dental Problems  Patients with Medicaid: [facility] [phone]                                                                   [phone] [address] Phone:  [phone]                                                                             Phone:  [phone]  If unable to pay or uninsured, contact:  [facility] or [facility]. to become qualified for the adult dental clinic.  Chronic Pain Problems Contact [facility]  [phone] Patients need to be referred by their primary care doctor.  Insufficient Money for Medicine Contact [employer]:  call "211" or [facility] 2165097287.  No Primary Care Doctor Call [facility]  617-376-7127 Other agencies that provide inexpensive medical care    [facility]  [phone]    [facility]  [phone]    [facility]  [phone]    [facility]  [phone]    [facility]  [phone]    [facility]  [phone]  Psychological Services [facility]  [phone] [facility]  [phone] [facility]   [phone] (emergency services [phone])  Abuse/Neglect [facility] Child Abuse Hotline (505)514-2808 [facility] Child Abuse Hotline (321)469-5731 (After Hours)  Emergency Shelter [facility] 203-068-4612  Maternity  Homes [facility] 913-361-4317 [facility] (315)[phone]  MRSA Hotline #:   339-868-1023  [facility]  [facility] Dept. [address] [place]                        [address]          [address]  Blondell Reveal Phone:  [phone]                                     Phone:  [phone]                   Phone:  [phone]  [facility] Mental [facility] Phone:  ([phone]  [facility] Child Abuse Hotline 2315926421 (709)073-5112 (After Hours)       *free text    If you develop symptoms of Shortness of Breath, Chest Pain, Swelling of lips, mouth or tongue or if your condition becomes worse with any new symptoms, see your doctor or return to the Emergency Department for immediate care. Emergency services are not intended to be a substitute for comprehensive medical attention.  Please contact your doctor for follow up if not improving as expected.   Call your doctor in 5-7 days or as directed if there is no improvement.   Community Resources: *IF YOU ARE IN IMMEDIATE DANGER CALL 911!  Abuse/Neglect:  Family Services Crisis Hotline [facility]): 510-696-5185 Center Against Violence [facility]): 534-541-6415  After hours, holidays and weekends: (984) 521-1921 National Domestic Violence Hotline: 540-455-8970  Mental Health: [facility]: Drucie Ip: ([phone]  Health Clinics:  [facility]): [phone] Monday - Friday 8 AM - 9 PM, Saturday and Sunday 10 AM - 9 PM  [facility] [place]: [phone] Monday - Friday 8 AM - 5 PM  [facility]  [address]: [phone] Monday- Friday 8:30 AM - 5:30 PM, Sat 9 AM - 1 PM  [address] [place]  Ph[facility]acies [facility] [address] [place]: [phone] [facility]: [phone] [facility] [address]: [phone]  [address] [facility]: [address] [phone]

## 2011-11-22 NOTE — ED Notes (Signed)
Patient complaining of toothache on right lower side; patient states that he has had a hole in his tooth for over a month.  Patient has been numbing gel for his tooth; states that the pain increasingly became worse when he bit down on food tonight.

## 2011-11-22 NOTE — ED Provider Notes (Signed)
History     CSN: [id number]  Arrival date & time 11/22/11  0359   First MD Initiated Contact with Patient 11/22/11 [phone]      Chief Complaint  Patient presents with  . Dental Pain    (Consider location/radiation/quality/duration/timing/severity/associated sxs/prior treatment) HPI Comments: Patient with large cavity in lower R second molar for over a month   Patient is a 41 y.o. male presenting with tooth pain. The history is provided by the patient.  Dental PainThe primary symptoms include mouth pain. Primary symptoms do not include dental injury, headaches or fever. The symptoms began more than 1 month ago. The symptoms are unchanged.    Past Medical History  Diagnosis Date  . Diabetes mellitus   . Hypertension     Past Surgical History  Procedure Date  . Mouth surgery     History reviewed. No pertinent family history.  History  Substance Use Topics  . Smoking status: Never Smoker   . Smokeless tobacco: Not on file  . Alcohol Use: No      Review of Systems  Constitutional: Negative for fever and chills.  HENT: Positive for dental problem.   Neurological: Negative for dizziness and headaches.    Allergies  Review of patient's allergies indicates no known allergies.  Home Medications   Current Outpatient Rx  Name Route Sig Dispense Refill  . METFORMIN HCL 500 MG PO TABS Oral Take 500 mg by mouth 2 (two) times daily with a meal.      . TRAMADOL HCL 50 MG PO TABS Oral Take 1 tablet (50 mg total) by mouth every 6 (six) hours as needed for pain. 15 tablet 0    BP 139/85  Pulse 95  Temp 97.6 F (36.4 C) (Oral)  Resp 18  SpO2 100%  Physical Exam  Constitutional: He appears well-developed and well-nourished.  HENT:  Mouth/Throat:    Eyes: Pupils are equal, round, and reactive to light.  Neck: Normal range of motion.  Cardiovascular: Normal rate.   Neurological: He is alert.  Skin: Skin is warm.    ED Course  Procedures (including critical care  time)  Labs Reviewed - No data to display No results found.   1. Dental cavity       MDM  Dental cavity without sign of abscess        Arman Filter, NP 11/22/11 0507  Arman Filter, NP 11/22/11 5621

## 2011-12-23 ENCOUNTER — Emergency Department (HOSPITAL_BASED_OUTPATIENT_CLINIC_OR_DEPARTMENT_OTHER)
Admission: EM | Admit: 2011-12-23 | Discharge: 2011-12-23 | Disposition: A | Discharge status: Home / Self Care | Payer: Self-pay | Attending: Emergency Medicine | Admitting: Emergency Medicine

## 2011-12-23 ENCOUNTER — Encounter (HOSPITAL_BASED_OUTPATIENT_CLINIC_OR_DEPARTMENT_OTHER): Payer: Self-pay | Admitting: *Deleted

## 2011-12-23 DIAGNOSIS — K089 Disorder of teeth and supporting structures, unspecified: Secondary | ICD-10-CM | POA: Insufficient documentation

## 2011-12-23 DIAGNOSIS — I10 Essential (primary) hypertension: Secondary | ICD-10-CM | POA: Insufficient documentation

## 2011-12-23 DIAGNOSIS — E119 Type 2 diabetes mellitus without complications: Secondary | ICD-10-CM | POA: Insufficient documentation

## 2011-12-23 DIAGNOSIS — K0889 Other specified disorders of teeth and supporting structures: Secondary | ICD-10-CM

## 2011-12-23 MED ORDER — HYDROCODONE-ACETAMINOPHEN 5-325 MG PO TABS: 2.0000 | ORAL_TABLET | ORAL | Status: AC | PRN

## 2011-12-23 MED ORDER — PENICILLIN V POTASSIUM 500 MG PO TABS: 500.0000 mg | ORAL_TABLET | Freq: Four times a day (QID) | ORAL | Status: AC

## 2011-12-23 NOTE — ED Notes (Signed)
Pt c/o dental pain x 4 days 

## 2011-12-23 NOTE — ED Provider Notes (Signed)
Medical screening examination/treatment/procedure(s) were performed by non-physician practitioner and as supervising physician I was immediately available for consultation/collaboration.   Tallin Hart, MD 12/23/11 2257 

## 2011-12-23 NOTE — ED Provider Notes (Signed)
History     CSN: [id number]  Arrival date & time 12/23/11  1646   First MD Initiated Contact with Patient 12/23/11 1721      Chief Complaint  Patient presents with  . Dental Pain    (Consider location/radiation/quality/duration/timing/severity/associated sxs/prior treatment) Patient is a 41 y.o. male presenting with tooth pain. The history is provided by the patient. No language interpreter was used.  Dental PainThe primary symptoms include mouth pain and dental injury. The symptoms began less than 1 hour ago. The symptoms are worsening. The symptoms are new. The symptoms occur constantly.    Past Medical History  Diagnosis Date  . Diabetes mellitus   . Hypertension     Past Surgical History  Procedure Date  . Mouth surgery     History reviewed. No pertinent family history.  History  Substance Use Topics  . Smoking status: Never Smoker   . Smokeless tobacco: Not on file  . Alcohol Use: No      Review of Systems  HENT: Positive for dental problem.   All other systems reviewed and are negative.    Allergies  Review of patient's allergies indicates no known allergies.  Home Medications   Current Outpatient Rx  Name Route Sig Dispense Refill  . IBUPROFEN 200 MG PO TABS Oral Take 400 mg by mouth every 6 (six) hours as needed. Patient used this medication at 12 noon today for his tooth pain.    Marland Kitchen LISINOPRIL 10 MG PO TABS Oral Take 10 mg by mouth daily.    Marland Kitchen METFORMIN HCL 500 MG PO TABS Oral Take 500 mg by mouth 2 (two) times daily with a meal.        BP 157/99  Pulse 79  Temp 98.5 F (36.9 C) (Oral)  Resp 16  Ht 5\' 9"  (1.753 m)  Wt 220 lb (99.791 kg)  BMI 32.49 kg/m2  SpO2 100%  Physical Exam  Nursing note and vitals reviewed. Constitutional: He is oriented to person, place, and time. He appears well-developed and well-nourished.  HENT:  Head: Normocephalic and atraumatic.  Right Ear: External ear normal.  Left Ear: External ear normal.   Swollen right lower jaw  Eyes: EOM are normal. Pupils are equal, round, and reactive to light.  Neck: Normal range of motion. Neck supple.  Cardiovascular: Normal rate.   Pulmonary/Chest: Effort normal.  Neurological: He is alert and oriented to person, place, and time. He has normal reflexes.  Skin: Skin is warm.    ED Course  Procedures (including critical care time)  Labs Reviewed - No data to display No results found.   No diagnosis found.    MDM  Rx for pcn and hydrocodone,  Referral to Dr. Velna Ochs, [place] 12/23/11 [phone]

## 2012-02-26 ENCOUNTER — Emergency Department (HOSPITAL_COMMUNITY): Payer: Self-pay

## 2012-02-26 ENCOUNTER — Encounter (HOSPITAL_COMMUNITY): Payer: Self-pay | Admitting: Emergency Medicine

## 2012-02-26 ENCOUNTER — Emergency Department (HOSPITAL_COMMUNITY)
Admission: EM | Admit: 2012-02-26 | Discharge: 2012-02-26 | Disposition: A | Discharge status: Home / Self Care | Payer: Self-pay | Attending: Emergency Medicine | Admitting: Emergency Medicine

## 2012-02-26 DIAGNOSIS — N200 Calculus of kidney: Secondary | ICD-10-CM | POA: Insufficient documentation

## 2012-02-26 DIAGNOSIS — R10819 Abdominal tenderness, unspecified site: Secondary | ICD-10-CM | POA: Insufficient documentation

## 2012-02-26 DIAGNOSIS — Z79899 Other long term (current) drug therapy: Secondary | ICD-10-CM | POA: Insufficient documentation

## 2012-02-26 DIAGNOSIS — E119 Type 2 diabetes mellitus without complications: Secondary | ICD-10-CM | POA: Insufficient documentation

## 2012-02-26 DIAGNOSIS — R109 Unspecified abdominal pain: Secondary | ICD-10-CM | POA: Insufficient documentation

## 2012-02-26 DIAGNOSIS — I10 Essential (primary) hypertension: Secondary | ICD-10-CM | POA: Insufficient documentation

## 2012-02-26 LAB — URINALYSIS, MICROSCOPIC ONLY
Bilirubin Urine: NEGATIVE
Leukocytes, UA: NEGATIVE
Nitrite: NEGATIVE
Specific Gravity, Urine: 1.023 (ref 1.005–1.030)
Urobilinogen, UA: 0.2 mg/dL (ref 0.0–1.0)
pH: 5.5 (ref 5.0–8.0)

## 2012-02-26 LAB — CBC WITH DIFFERENTIAL/PLATELET
Eosinophils Absolute: 0.2 10*3/uL (ref 0.0–0.7)
Eosinophils Relative: 3 % (ref 0–5)
HCT: 43.9 % (ref 39.0–52.0)
Lymphocytes Relative: 53 % — ABNORMAL HIGH (ref 12–46)
Lymphs Abs: 3.4 10*3/uL (ref 0.7–4.0)
MCH: 28 pg (ref 26.0–34.0)
MCV: 80.3 fL (ref 78.0–100.0)
Monocytes Absolute: 0.4 10*3/uL (ref 0.1–1.0)
Platelets: 231 10*3/uL (ref 150–400)
RBC: 5.47 MIL/uL (ref 4.22–5.81)
WBC: 6.5 10*3/uL (ref 4.0–10.5)

## 2012-02-26 LAB — COMPREHENSIVE METABOLIC PANEL
BUN: 10 mg/dL (ref 6–23)
CO2: 25 mEq/L (ref 19–32)
Calcium: 9.6 mg/dL (ref 8.4–10.5)
Creatinine, Ser: 0.92 mg/dL (ref 0.50–1.35)
GFR calc Af Amer: >90 mL/min (ref >90)
GFR calc non Af Amer: >90 mL/min (ref >90)
Glucose, Bld: 117 mg/dL — ABNORMAL HIGH (ref 70–99)
Sodium: 136 mEq/L (ref 135–145)
Total Protein: 8 g/dL (ref 6.0–8.3)

## 2012-02-26 MED ORDER — IOHEXOL 300 MG/ML  SOLN
100.0000 mL | Freq: Once | INTRAMUSCULAR | Status: AC | PRN
  Administered 2012-02-26: 100 mL via INTRAVENOUS

## 2012-02-26 MED ORDER — ONDANSETRON HCL 4 MG/2ML IJ SOLN
4.0000 mg | Freq: Once | INTRAMUSCULAR | Status: AC
  Administered 2012-02-26: 4 mg via INTRAVENOUS
  Filled 2012-02-26: qty 2

## 2012-02-26 MED ORDER — MORPHINE SULFATE 4 MG/ML IJ SOLN
4.0000 mg | Freq: Once | INTRAMUSCULAR | Status: AC
  Administered 2012-02-26: 4 mg via INTRAVENOUS
  Filled 2012-02-26: qty 1

## 2012-02-26 MED ORDER — MORPHINE SULFATE 4 MG/ML IJ SOLN: 4.0000 mg | Freq: Once | INTRAMUSCULAR | Status: DC

## 2012-02-26 MED ORDER — SODIUM CHLORIDE 0.9 % IV SOLN
INTRAVENOUS | Status: DC
  Administered 2012-02-26 (×2): via INTRAVENOUS

## 2012-02-26 MED ORDER — OXYCODONE-ACETAMINOPHEN 5-325 MG PO TABS: 2.0000 | ORAL_TABLET | ORAL | Status: DC | PRN

## 2012-02-26 NOTE — ED Notes (Signed)
Patient transported to CT 

## 2012-02-26 NOTE — ED Notes (Signed)
Pt presenting to ed with c/o right side flank pain that's been going on for years. Pt states pain is getting worse. Pt states no nausea, vomiting at this time. Pt states no hematuria at this time

## 2012-02-26 NOTE — ED Provider Notes (Addendum)
History     CSN: [id number]  Arrival date & time 02/26/12  1725   First MD Initiated Contact with Patient 02/26/12 1756      Chief Complaint  Patient presents with  . Flank Pain    (Consider location/radiation/quality/duration/timing/severity/associated sxs/prior treatment) Patient is a 41 y.o. male presenting with flank pain. The history is provided by the patient.  Flank Pain Pertinent negatives include no chest pain, no abdominal pain and no shortness of breath.   41 year old, male, with a history of non-insulin-dependent diabetes.  Presents to emergency department complaining of right flank pain.  For several weeks.  He denies trauma.  He denies heavy lifting or moving heavy objects.  He denies cough, or shortness of breath.  Denies nausea, vomiting, or chills.  He denies hematuria or dysuria.  He has not had weight loss, or night sweats.  The pain does not change with eating food.  It does increase with movement and palpation  Past Medical History  Diagnosis Date  . Diabetes mellitus   . Hypertension     Past Surgical History  Procedure Date  . Mouth surgery     No family history on file.  History  Substance Use Topics  . Smoking status: Never Smoker   . Smokeless tobacco: Not on file  . Alcohol Use: No      Review of Systems  Constitutional: Negative for fever, chills, diaphoresis, fatigue and unexpected weight change.  Respiratory: Negative for cough and shortness of breath.   Cardiovascular: Negative for chest pain.  Gastrointestinal: Negative for nausea, vomiting and abdominal pain.  Genitourinary: Positive for flank pain. Negative for dysuria and hematuria.  Musculoskeletal: Negative for back pain.  Neurological: Negative for weakness.  Hematological: Does not bruise/bleed easily.  All other systems reviewed and are negative.    Allergies  Review of patient's allergies indicates no known allergies.  Home Medications   Current Outpatient Rx  Name  Route Sig Dispense Refill  . IBUPROFEN 200 MG PO TABS Oral Take 400 mg by mouth every 8 (eight) hours as needed.     Marland Kitchen LISINOPRIL 10 MG PO TABS Oral Take 10 mg by mouth daily.    Marland Kitchen METFORMIN HCL 500 MG PO TABS Oral Take 500 mg by mouth 2 (two) times daily with a meal.        BP 156/76  Pulse 73  Temp 98.6 F (37 C) (Oral)  Resp 20  SpO2 98%  Physical Exam  Nursing note and vitals reviewed. Constitutional: He is oriented to person, place, and time. He appears well-developed and well-nourished. No distress.  HENT:  Head: Normocephalic and atraumatic.  Eyes: Conjunctivae normal and EOM are normal.  Neck: Normal range of motion. Neck supple.  Cardiovascular: Normal rate.   Pulmonary/Chest: Effort normal and breath sounds normal. No respiratory distress.  Abdominal: Soft. Bowel sounds are normal. He exhibits no mass. There is no tenderness. There is no rebound and no guarding.  Genitourinary:       Right flank tenderness to palpation  Musculoskeletal: Normal range of motion. He exhibits no edema.  Neurological: He is alert and oriented to person, place, and time. No cranial nerve deficit.  Skin: Skin is warm and dry.  Psychiatric: He has a normal mood and affect. Thought content normal.    ED Course  Procedures (including critical care time) 41 year old, male, with chronic, right flank pain.  No systemic symptoms.  No symptoms to suggest urinary etiology.  No history of trauma.  We will do a CAT scan of his abdomen and laboratory testing, to evaluate for possible malignancy.  Labs Reviewed  CBC WITH DIFFERENTIAL - Abnormal; Notable for the following:    Neutrophils Relative 39 (*)     Lymphocytes Relative 53 (*)     All other components within normal limits  COMPREHENSIVE METABOLIC PANEL - Abnormal; Notable for the following:    Potassium 3.1 (*)     Glucose, Bld 117 (*)     All other components within normal limits  URINALYSIS, MICROSCOPIC ONLY - Abnormal; Notable for the  following:    Hgb urine dipstick TRACE (*)     Bacteria, UA FEW (*)     Squamous Epithelial / LPF FEW (*)     All other components within normal limits  GLUCOSE, CAPILLARY - Abnormal; Notable for the following:    Glucose-Capillary 101 (*)     All other components within normal limits   No results found.   No diagnosis found.  9:00 PM Pain controlled  MDM  Right flank pain        Cheri Guppy, MD 02/26/12 2015  Cheri Guppy, MD 02/26/12 2100

## 2012-04-15 ENCOUNTER — Emergency Department (HOSPITAL_COMMUNITY)
Admission: EM | Admit: 2012-04-15 | Discharge: 2012-04-16 | Disposition: A | Discharge status: Home / Self Care | Payer: Self-pay | Attending: Emergency Medicine | Admitting: Emergency Medicine

## 2012-04-15 ENCOUNTER — Encounter (HOSPITAL_COMMUNITY): Payer: Self-pay | Admitting: Emergency Medicine

## 2012-04-15 DIAGNOSIS — I10 Essential (primary) hypertension: Secondary | ICD-10-CM | POA: Insufficient documentation

## 2012-04-15 DIAGNOSIS — K0889 Other specified disorders of teeth and supporting structures: Secondary | ICD-10-CM

## 2012-04-15 DIAGNOSIS — Z79899 Other long term (current) drug therapy: Secondary | ICD-10-CM | POA: Insufficient documentation

## 2012-04-15 DIAGNOSIS — K089 Disorder of teeth and supporting structures, unspecified: Secondary | ICD-10-CM | POA: Insufficient documentation

## 2012-04-15 DIAGNOSIS — E119 Type 2 diabetes mellitus without complications: Secondary | ICD-10-CM | POA: Insufficient documentation

## 2012-04-15 NOTE — ED Notes (Signed)
Pt alert, arrives from home, c/o dental pain, onset several days ago, denies trauma or injury, dental carries noted

## 2012-04-16 MED ORDER — PENICILLIN V POTASSIUM 500 MG PO TABS: 500.0000 mg | ORAL_TABLET | Freq: Four times a day (QID) | ORAL | Status: AC

## 2012-04-16 NOTE — ED Provider Notes (Signed)
History     CSN: [id number]  Arrival date & time 04/15/12  2324   First MD Initiated Contact with Patient 04/16/12 0015      Chief Complaint  Patient presents with  . Dental Pain    (Consider location/radiation/quality/duration/timing/severity/associated sxs/prior treatment) Patient is a 41 y.o. male presenting with tooth pain. The history is provided by the patient. No language interpreter was used.  Dental PainThe primary symptoms include mouth pain. Primary symptoms do not include headaches, fever, shortness of breath or sore throat. The symptoms began 2 days ago. The symptoms are worsening. The symptoms occur constantly.  Additional symptoms include: dental sensitivity to temperature, gum swelling and gum tenderness. Additional symptoms do not include: purulent gums, trismus, jaw pain, facial swelling, trouble swallowing and ear pain.  Recurring R lower dental pain with gum swelling.  Has not followed up with dentist.  Multiple ER visits for the same.  Wants antibiotic and no pain meds. Her with his 4 kids.    Past Medical History  Diagnosis Date  . Diabetes mellitus   . Hypertension     Past Surgical History  Procedure Date  . Mouth surgery     No family history on file.  History  Substance Use Topics  . Smoking status: Never Smoker   . Smokeless tobacco: Not on file  . Alcohol Use: No      Review of Systems  Constitutional: Negative.  Negative for fever.  HENT: Positive for dental problem. Negative for ear pain, sore throat, facial swelling and trouble swallowing.   Eyes: Negative.   Respiratory: Negative.  Negative for shortness of breath.   Cardiovascular: Negative.   Gastrointestinal: Negative.   Neurological: Negative.  Negative for headaches.  Psychiatric/Behavioral: Negative.   All other systems reviewed and are negative.    Allergies  Review of patient's allergies indicates no known allergies.  Home Medications   Current Outpatient Rx  Name   Route  Sig  Dispense  Refill  . IBUPROFEN 200 MG PO TABS   Oral   Take 400 mg by mouth every 8 (eight) hours as needed. For pain         . LISINOPRIL 10 MG PO TABS   Oral   Take 10 mg by mouth daily.         Marland Kitchen METFORMIN HCL 500 MG PO TABS   Oral   Take 500 mg by mouth 2 (two) times daily with a meal.           . PENICILLIN V POTASSIUM 500 MG PO TABS   Oral   Take 1 tablet (500 mg total) by mouth 4 (four) times daily.   40 tablet   0     BP 137/78  Pulse 66  Temp 97.7 F (36.5 C) (Oral)  Resp 18  SpO2 98%  Physical Exam  Nursing note and vitals reviewed. Constitutional: He is oriented to person, place, and time. He appears well-developed and well-nourished.  HENT:  Head: Normocephalic.  Mouth/Throat: Uvula is midline and mucous membranes are normal. No posterior oropharyngeal edema or posterior oropharyngeal erythema.         R lower dental pain with caries  Eyes: Conjunctivae normal and EOM are normal. Pupils are equal, round, and reactive to light.  Neck: Normal range of motion. Neck supple.  Cardiovascular: Normal rate.   Pulmonary/Chest: Effort normal.  Abdominal: Soft.  Musculoskeletal: Normal range of motion.  Neurological: He is alert and oriented to person, place, and time.  Skin: Skin is warm and dry.  Psychiatric: He has a normal mood and affect.    ED Course  Procedures (including critical care time)  Labs Reviewed - No data to display No results found.   1. Pain, dental       MDM  R lower recurring dental pain.  rx for pcn.  Ibuprofen for pain.  Encouraged to follow up with dentist this time.  Return for worsening symptoms.          Remi Haggard, NP 04/16/12 1224

## 2012-04-17 NOTE — ED Provider Notes (Signed)
Medical screening examination/treatment/procedure(s) were performed by non-physician practitioner and as supervising physician I was immediately available for consultation/collaboration.  Jasmine Awe, MD 04/17/12 [phone]

## 2013-10-15 ENCOUNTER — Emergency Department (HOSPITAL_COMMUNITY)
Admission: EM | Admit: 2013-10-15 | Discharge: 2013-10-16 | Disposition: A | Discharge status: Other Acute Inpatient Hospital | Payer: Self-pay | Attending: Emergency Medicine | Admitting: Emergency Medicine

## 2013-10-15 ENCOUNTER — Encounter (HOSPITAL_COMMUNITY): Payer: Self-pay | Admitting: Emergency Medicine

## 2013-10-15 DIAGNOSIS — F32A Depression, unspecified: Secondary | ICD-10-CM

## 2013-10-15 DIAGNOSIS — R45850 Homicidal ideations: Secondary | ICD-10-CM | POA: Insufficient documentation

## 2013-10-15 DIAGNOSIS — R45851 Suicidal ideations: Secondary | ICD-10-CM | POA: Insufficient documentation

## 2013-10-15 DIAGNOSIS — F329 Major depressive disorder, single episode, unspecified: Secondary | ICD-10-CM

## 2013-10-15 DIAGNOSIS — F17200 Nicotine dependence, unspecified, uncomplicated: Secondary | ICD-10-CM | POA: Insufficient documentation

## 2013-10-15 DIAGNOSIS — R209 Unspecified disturbances of skin sensation: Secondary | ICD-10-CM | POA: Insufficient documentation

## 2013-10-15 DIAGNOSIS — R42 Dizziness and giddiness: Secondary | ICD-10-CM | POA: Insufficient documentation

## 2013-10-15 DIAGNOSIS — E1142 Type 2 diabetes mellitus with diabetic polyneuropathy: Secondary | ICD-10-CM | POA: Insufficient documentation

## 2013-10-15 DIAGNOSIS — E1149 Type 2 diabetes mellitus with other diabetic neurological complication: Secondary | ICD-10-CM | POA: Insufficient documentation

## 2013-10-15 DIAGNOSIS — R5381 Other malaise: Secondary | ICD-10-CM | POA: Insufficient documentation

## 2013-10-15 DIAGNOSIS — F3289 Other specified depressive episodes: Secondary | ICD-10-CM | POA: Insufficient documentation

## 2013-10-15 DIAGNOSIS — F39 Unspecified mood [affective] disorder: Secondary | ICD-10-CM | POA: Insufficient documentation

## 2013-10-15 DIAGNOSIS — I10 Essential (primary) hypertension: Secondary | ICD-10-CM | POA: Insufficient documentation

## 2013-10-15 DIAGNOSIS — R5383 Other fatigue: Secondary | ICD-10-CM

## 2013-10-15 HISTORY — DX: Type 2 diabetes mellitus with diabetic neuropathy, unspecified: E11.40

## 2013-10-15 LAB — COMPREHENSIVE METABOLIC PANEL
ALT: 10 U/L (ref 0–53)
AST: 22 U/L (ref 0–37)
Albumin: 3.8 g/dL (ref 3.5–5.2)
Alkaline Phosphatase: 104 U/L (ref 39–117)
BUN: 10 mg/dL (ref 6–23)
CO2: 23 mEq/L (ref 19–32)
CREATININE: 0.89 mg/dL (ref 0.50–1.35)
Calcium: 9.1 mg/dL (ref 8.4–10.5)
Chloride: 106 mEq/L (ref 96–112)
GFR calc non Af Amer: >90 mL/min (ref >90)
Glucose, Bld: 135 mg/dL — ABNORMAL HIGH (ref 70–99)
POTASSIUM: 3.6 meq/L — AB (ref 3.7–5.3)
SODIUM: 142 meq/L (ref 137–147)
TOTAL PROTEIN: 8 g/dL (ref 6.0–8.3)
Total Bilirubin: 0.5 mg/dL (ref 0.3–1.2)

## 2013-10-15 LAB — CBC WITH DIFFERENTIAL/PLATELET
BASOS ABS: 0 10*3/uL (ref 0.0–0.1)
Basophils Relative: 0 % (ref 0–1)
Eosinophils Absolute: 0.3 10*3/uL (ref 0.0–0.7)
Eosinophils Relative: 6 % — ABNORMAL HIGH (ref 0–5)
HCT: 47 % (ref 39.0–52.0)
Hemoglobin: 16.5 g/dL (ref 13.0–17.0)
Lymphocytes Relative: 41 % (ref 12–46)
Lymphs Abs: 2.1 10*3/uL (ref 0.7–4.0)
MCH: 29.6 pg (ref 26.0–34.0)
MCHC: 35.1 g/dL (ref 30.0–36.0)
MCV: 84.2 fL (ref 78.0–100.0)
Monocytes Absolute: 0.2 10*3/uL (ref 0.1–1.0)
Monocytes Relative: 4 % (ref 3–12)
Neutro Abs: 2.5 10*3/uL (ref 1.7–7.7)
Neutrophils Relative %: 49 % (ref 43–77)
PLATELETS: 219 10*3/uL (ref 150–400)
RBC: 5.58 MIL/uL (ref 4.22–5.81)
RDW: 12.6 % (ref 11.5–15.5)
WBC: 5 10*3/uL (ref 4.0–10.5)

## 2013-10-15 LAB — ETHANOL

## 2013-10-15 LAB — RAPID URINE DRUG SCREEN, HOSP PERFORMED
AMPHETAMINES: NOT DETECTED
BENZODIAZEPINES: NOT DETECTED
Barbiturates: NOT DETECTED
COCAINE: NOT DETECTED
Opiates: NOT DETECTED
Tetrahydrocannabinol: NOT DETECTED

## 2013-10-15 LAB — URINALYSIS, ROUTINE W REFLEX MICROSCOPIC
BILIRUBIN URINE: NEGATIVE
GLUCOSE, UA: 500 mg/dL — AB
Ketones, ur: NEGATIVE mg/dL
Leukocytes, UA: NEGATIVE
Nitrite: NEGATIVE
Protein, ur: NEGATIVE mg/dL
Specific Gravity, Urine: 1.024 (ref 1.005–1.030)
UROBILINOGEN UA: 1 mg/dL (ref 0.0–1.0)
pH: 5.5 (ref 5.0–8.0)

## 2013-10-15 LAB — SALICYLATE LEVEL

## 2013-10-15 LAB — CBG MONITORING, ED: Glucose-Capillary: 127 mg/dL — ABNORMAL HIGH (ref 70–99)

## 2013-10-15 LAB — URINE MICROSCOPIC-ADD ON

## 2013-10-15 LAB — ACETAMINOPHEN LEVEL: Acetaminophen (Tylenol), Serum: <15 ug/mL (ref 10–30)

## 2013-10-15 MED ORDER — IBUPROFEN 200 MG PO TABS: 600.0000 mg | ORAL_TABLET | Freq: Three times a day (TID) | ORAL | Status: DC | PRN

## 2013-10-15 MED ORDER — ONDANSETRON HCL 4 MG PO TABS: 4.0000 mg | ORAL_TABLET | Freq: Three times a day (TID) | ORAL | Status: DC | PRN

## 2013-10-15 MED ORDER — LORAZEPAM 1 MG PO TABS
1.0000 mg | ORAL_TABLET | Freq: Three times a day (TID) | ORAL | Status: DC | PRN
  Administered 2013-10-15: 1 mg via ORAL
  Filled 2013-10-15: qty 1

## 2013-10-15 MED ORDER — NICOTINE 21 MG/24HR TD PT24
21.0000 mg | MEDICATED_PATCH | Freq: Every day | TRANSDERMAL | Status: DC
  Administered 2013-10-15: 21 mg via TRANSDERMAL
  Filled 2013-10-15: qty 1

## 2013-10-15 MED ORDER — ZOLPIDEM TARTRATE 5 MG PO TABS: 5.0000 mg | ORAL_TABLET | Freq: Every evening | ORAL | Status: DC | PRN

## 2013-10-15 MED ORDER — ACETAMINOPHEN 325 MG PO TABS
650.0000 mg | ORAL_TABLET | ORAL | Status: DC | PRN
  Administered 2013-10-16: 650 mg via ORAL
  Filled 2013-10-15: qty 2

## 2013-10-15 MED ORDER — ALUM & MAG HYDROXIDE-SIMETH 200-200-20 MG/5ML PO SUSP: 30.0000 mL | ORAL | Status: DC | PRN

## 2013-10-15 NOTE — ED Notes (Signed)
Pt. Anxious. States, "regardless of psyche eval. Emotions would not change."

## 2013-10-15 NOTE — ED Notes (Signed)
CBG 127 
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
## 2013-10-15 NOTE — BH Assessment (Signed)
Calls have been placed to the following facilities for possible inpatient:  [place] Per [place] No beds [facility] no beds tonight for two males but referrals can be faxed and reviewed after 8am tomorrow [facility] no beds for 18-54 yr admissions [facility] beds for 55+ [facility] no beds tonight but D/C's in the am [facility] be fax[facility] Left Message [place] no beds tonight but D/C's in the am [facility] fax[facility] but referral can be faxed and reviewed after am D/C's  Bernadene PersonSybrina Anaily Ashbaugh Disposition MHT/NS

## 2013-10-15 NOTE — ED Provider Notes (Signed)
CSN: [id number]     Arrival date & time 10/15/13  1636 History   First MD Initiated Contact with Patient 10/15/13 1650     Chief Complaint  Patient presents with  . Foot Pain     (Consider location/radiation/quality/duration/timing/severity/associated sxs/prior Treatment) HPI Comments: Patient is a 43 year old male with history of diabetes, hypertension, and diabetic neuropathy who presents today for evaluation of bilateral foot pain. He reports that over the past 4 days he has been having a gradually worsening pins and needles sensation in his feet. It is bad all the time. He has not tried anything for the pain. No prior injuries. He has associated lightheadedness, polyuria, polydipsia, and polyphagia. He feels generally weak. He does not take any medications on a daily basis. No fevers, chills, shortness of breath, chest pain, nausea, vomiting, diarrhea. He then states he has had worsening depression over the past year since his wife cheated on him. He states he sometimes has thoughts about hurting his wife and himself. These ideas are vague and patient does not tell me a plan. He denies drug or alcohol use. No visual or auditory hallucinations. He has not seen a psychiatrist since he was a teenager. He is not on any psych meds.   Patient is a 43 y.o. male presenting with lower extremity pain. The history is provided by the patient. No language interpreter was used.  Foot Pain Associated symptoms include myalgias. Pertinent negatives include no chills or fever.    Past Medical History  Diagnosis Date  . Diabetes mellitus   . Hypertension   . Diabetic neuropathy    Past Surgical History  Procedure Laterality Date  . Mouth surgery    . Tympanostomy tube placement     History reviewed. No pertinent family history. History  Substance Use Topics  . Smoking status: Current Every Day Smoker  . Smokeless tobacco: Not on file  . Alcohol Use: No    Review of Systems  Constitutional:  Negative for fever and chills.  Musculoskeletal: Positive for myalgias.  Psychiatric/Behavioral: Positive for suicidal ideas and dysphoric mood.  All other systems reviewed and are negative.     Allergies  Review of patient's allergies indicates no known allergies.  Home Medications   Prior to Admission medications   Not on File   BP 145/78  Pulse 98  Temp(Src) 98.7 F (37.1 C) (Oral)  Resp 18  Ht 5\' 9"  (1.753 m)  Wt 207 lb (93.895 kg)  BMI 30.55 kg/m2  SpO2 97% Physical Exam  Nursing note and vitals reviewed. Constitutional: He is oriented to person, place, and time. He appears well-developed and well-nourished. No distress.  HENT:  Head: Normocephalic and atraumatic.  Right Ear: External ear normal.  Left Ear: External ear normal.  Nose: Nose normal.  Eyes: Conjunctivae are normal.  Neck: Normal range of motion. No tracheal deviation present.  Cardiovascular: Normal rate, regular rhythm, normal heart sounds, intact distal pulses and normal pulses.   Pulses:      Radial pulses are 2+ on the right side, and 2+ on the left side.       Dorsalis pedis pulses are 2+ on the right side, and 2+ on the left side.       Posterior tibial pulses are 2+ on the right side, and 2+ on the left side.  Capillary refill 3 seconds in all toes.   Pulmonary/Chest: Effort normal and breath sounds normal. No stridor.  Abdominal: Soft. He exhibits no distension. There  is no tenderness.  Musculoskeletal: Normal range of motion.  Tenderness to palpation diffusely over feet bilaterally  Neurological: He is alert and oriented to person, place, and time.  Sensation intact to all toes  Skin: Skin is warm and dry. He is not diaphoretic.  Psychiatric: His behavior is normal. He exhibits a depressed mood. He expresses homicidal and suicidal ideation. He expresses no suicidal plans and no homicidal plans.    ED Course  Procedures (including critical care time) Labs Review Labs Reviewed  CBC WITH  DIFFERENTIAL - Abnormal; Notable for the following:    Eosinophils Relative 6 (*)    All other components within normal limits  COMPREHENSIVE METABOLIC PANEL - Abnormal; Notable for the following:    Potassium 3.6 (*)    Glucose, Bld 135 (*)    All other components within normal limits  URINALYSIS, ROUTINE W REFLEX MICROSCOPIC - Abnormal; Notable for the following:    Glucose, UA 500 (*)    Hgb urine dipstick MODERATE (*)    All other components within normal limits  SALICYLATE LEVEL - Abnormal; Notable for the following:    Salicylate Lvl <2.0 (*)    All other components within normal limits  URINE MICROSCOPIC-ADD ON - Abnormal; Notable for the following:    Crystals CA OXALATE CRYSTALS (*)    All other components within normal limits  CBG MONITORING, ED - Abnormal; Notable for the following:    Glucose-Capillary 127 (*)    All other components within normal limits  CBG MONITORING, ED - Abnormal; Notable for the following:    Glucose-Capillary 114 (*)    All other components within normal limits  CBG MONITORING, ED - Abnormal; Notable for the following:    Glucose-Capillary 117 (*)    All other components within normal limits  URINE CULTURE  ACETAMINOPHEN LEVEL  URINE RAPID DRUG SCREEN (HOSP PERFORMED)  ETHANOL    Imaging Review No results found.   EKG Interpretation None      MDM   Final diagnoses:  Depression  Suicidal ideation    Patient presents to ED for evaluation of bilateral foot pain and suicidal ideation. Patient's labs are unremarkable. Patient with vague SI to me, but specific SI with intent when Dr. Gwendolyn GrantWalden evaluated this patient. The patient is medically cleared and will be evaluated by TTS.   Mora BellmanHannah S Jaimey Franchini, PA-C 10/17/13 [phone]

## 2013-10-15 NOTE — ED Notes (Signed)
Pt presents with bilateral foot numbness, dizziness, increase urination, thirst, hunger, and weakness for 4 days. Pt states he has a hx of DM, HTN, and diabetic neuropathy to bilateral feet and has not taken any of his medications in over a year

## 2013-10-15 NOTE — ED Notes (Signed)
RN on pod C to call report

## 2013-10-15 NOTE — ED Notes (Signed)
Tele Psyche at bedside 

## 2013-10-15 NOTE — BH Assessment (Signed)
Received a call for a tele-assessment. Spoke with Dr. Gwendolyn GrantWalden who stated that pt initial presented to the ED with foot pain and after further assessment pt began speaking about suicidal ideations. Pt has a history of depression; however he did not report any previous suicide attempts nor is he prescribed any psychiatric medications. Pt is dealing with some life stressors such as his lady friend cheating on him and financial stressors. Pt did not identify a plan. Tele-assessment will be initiated.

## 2013-10-15 NOTE — ED Notes (Signed)
PA-C talked with pt. Earlier re: plan of care, including IVC. Very cooperative.

## 2013-10-15 NOTE — BH Assessment (Signed)
Tele-assessment completed. Consulted with Alberteen SamFran Hobson, NP who agrees that pt meets inpatient criteria. TTS will seek placement at other facilities. Notified Dr. Gwendolyn GrantWalden of recommendations.

## 2013-10-16 ENCOUNTER — Encounter (HOSPITAL_COMMUNITY): Payer: Self-pay | Admitting: Emergency Medicine

## 2013-10-16 LAB — URINE CULTURE
COLONY COUNT: NO GROWTH
Culture: NO GROWTH

## 2013-10-16 LAB — CBG MONITORING, ED
Glucose-Capillary: 114 mg/dL — ABNORMAL HIGH (ref 70–99)
Glucose-Capillary: 117 mg/dL — ABNORMAL HIGH (ref 70–99)

## 2013-10-16 NOTE — Discharge Instructions (Signed)
Transfer to [facility] 

## 2013-10-16 NOTE — ED Notes (Signed)
[name] will accept pt even with IVC papers, Dr. Les Pouarlton is the accepting physician, [facility], nurse to nurse report needs to be made after 0900 at [phone]

## 2013-10-16 NOTE — BH Assessment (Signed)
Patient has been IVC and Theatre managerLeslie RN [facility]; Then [facility] Papers was [facility]ed [facility] for files. Bernadene PersonSybrina Harbor Paster Disposition MHT/NS

## 2013-10-16 NOTE — ED Notes (Signed)
[facility]

## 2013-10-16 NOTE — ED Notes (Signed)
Report given to  Inetta Fermoina, Charity fundraiserN [facility].  [facility]

## 2013-10-16 NOTE — BH Assessment (Addendum)
[facility]. RN to RN report [phone] after 9am. This Clinical research associatewriter has notified RN Verlon AuLeslie @cone  Pod C. Bernadene PersonSybrina Tonishia Steffy Disposition MHT/NS

## 2013-10-16 NOTE — Progress Notes (Signed)
  CARE MANAGEMENT ED NOTE 10/16/2013  Patient:  Richard [name],Richard [name]   Account Number:  [id number]  Date Initiated:  10/16/2013  Documentation initiated by:  Ferdinand CavaSCHETTINO,Kamir Selover  Subjective/Objective Assessment:   43 yo male presenting to the ED with SI, PMH DM, HTN with no PCP or insurance     Subjective/Objective Assessment Detail:   HPI Comments: Patient is a 43 year old male with history of diabetes, hypertension, and diabetic neuropathy who presents today for evaluation of bilateral foot pain. He reports that over the past 4 days he has been having a gradually worsening pins and needles sensation in his feet. It is bad all the time. He has not tried anything for the pain. No prior injuries. He has associated lightheadedness, polyuria, polydipsia, and polyphagia. He feels generally weak. He does not take any medications on a daily basis. No fevers, chills, shortness of breath, chest pain, nausea, vomiting, diarrhea. He then states he has had worsening depression over the past year since his wife cheated on him. He states he sometimes has thoughts about hurting his wife and himself. These ideas are vague and patient does not tell me a plan. He denies drug or alcohol use. No visual or auditory hallucinations. He has not seen a psychiatrist since he was a teenager. He is not on any psych meds.     Action/Plan:   Action/Plan Detail:   Anticipated DC Date:       Status Recommendation to Physician:   Result of Recommendation:  Agreed    DC Planning Services  CM consult  PCP issues  Other    Choice offered to / List presented to:  C-1 Patient          Status of service:  Completed, signed off  ED Comments:   ED Comments Detail:  CM spoke with patient regarding ED visit and no PCP or insurance with PMH DM, HTN. Patient stated that he was covered with Medicaid, he stated that he and his wife were placed on a card and his daughters with special needs were placed on another card. He said that he did  have a PCP 2 years ago and that is when he was diagnosed with DM and placed on "pills" and had to start checking his blood sugar. Since that time he has fallen into difficulty with his spouse and obtained a job and with this was no longer eligible for Medicaid and couldn't afford a PCP or his medications and it wasn't a priority. Provided the patient with a resource list and reviewed the local clinics that have pharmacies and financial counselors on site in addition to medical services. Encouraged patient to follow up at one of these clinics to tap into medical care resources and financial resources. Discussed the long term effects of untreated DM. Reviewed the resource list with the patient and reviewed the DSS contact number for follow up on Medicaid and the available housing resources. Patient was appreciative of resources and verbalized understanding for obtaining a Clinic appointment and establishing care with a PCP.

## 2013-10-16 NOTE — ED Provider Notes (Signed)
Pt alert, content, vital signs normal.  Flat affect/depressed mood. [facility], Dr Les Pouarlton. Pt appears stable for transfer.      Suzi RootsKevin E Emmakate Hypes, MD 10/16/13 1043
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
## 2013-10-27 ENCOUNTER — Emergency Department (HOSPITAL_COMMUNITY)
Admission: EM | Admit: 2013-10-27 | Discharge: 2013-10-28 | Disposition: A | Discharge status: Other Acute Inpatient Hospital | Payer: Self-pay | Attending: Emergency Medicine | Admitting: Emergency Medicine

## 2013-10-27 ENCOUNTER — Encounter (HOSPITAL_COMMUNITY): Payer: Self-pay | Admitting: Emergency Medicine

## 2013-10-27 DIAGNOSIS — F332 Major depressive disorder, recurrent severe without psychotic features: Secondary | ICD-10-CM

## 2013-10-27 DIAGNOSIS — F329 Major depressive disorder, single episode, unspecified: Secondary | ICD-10-CM | POA: Insufficient documentation

## 2013-10-27 DIAGNOSIS — R45850 Homicidal ideations: Secondary | ICD-10-CM

## 2013-10-27 DIAGNOSIS — E1142 Type 2 diabetes mellitus with diabetic polyneuropathy: Secondary | ICD-10-CM | POA: Insufficient documentation

## 2013-10-27 DIAGNOSIS — E1149 Type 2 diabetes mellitus with other diabetic neurological complication: Secondary | ICD-10-CM | POA: Insufficient documentation

## 2013-10-27 DIAGNOSIS — I10 Essential (primary) hypertension: Secondary | ICD-10-CM | POA: Insufficient documentation

## 2013-10-27 DIAGNOSIS — F411 Generalized anxiety disorder: Secondary | ICD-10-CM

## 2013-10-27 DIAGNOSIS — F17200 Nicotine dependence, unspecified, uncomplicated: Secondary | ICD-10-CM | POA: Insufficient documentation

## 2013-10-27 DIAGNOSIS — R45851 Suicidal ideations: Secondary | ICD-10-CM | POA: Insufficient documentation

## 2013-10-27 LAB — CBC
HEMATOCRIT: 48.3 % (ref 39.0–52.0)
Hemoglobin: 16.6 g/dL (ref 13.0–17.0)
MCH: 28.8 pg (ref 26.0–34.0)
MCHC: 34.4 g/dL (ref 30.0–36.0)
MCV: 83.7 fL (ref 78.0–100.0)
Platelets: 191 10*3/uL (ref 150–400)
RBC: 5.77 MIL/uL (ref 4.22–5.81)
RDW: 12.4 % (ref 11.5–15.5)
WBC: 7.2 10*3/uL (ref 4.0–10.5)

## 2013-10-27 LAB — RAPID URINE DRUG SCREEN, HOSP PERFORMED
AMPHETAMINES: NOT DETECTED
Barbiturates: NOT DETECTED
Benzodiazepines: NOT DETECTED
Cocaine: NOT DETECTED
Opiates: NOT DETECTED
Tetrahydrocannabinol: NOT DETECTED

## 2013-10-27 LAB — COMPREHENSIVE METABOLIC PANEL
ALT: 26 U/L (ref 0–53)
AST: 26 U/L (ref 0–37)
Albumin: 3.8 g/dL (ref 3.5–5.2)
Alkaline Phosphatase: 111 U/L (ref 39–117)
BUN: 9 mg/dL (ref 6–23)
CO2: 28 meq/L (ref 19–32)
CREATININE: 0.84 mg/dL (ref 0.50–1.35)
Calcium: 9.7 mg/dL (ref 8.4–10.5)
Chloride: 98 mEq/L (ref 96–112)
Glucose, Bld: 112 mg/dL — ABNORMAL HIGH (ref 70–99)
Potassium: 4.2 mEq/L (ref 3.7–5.3)
Sodium: 140 mEq/L (ref 137–147)
Total Bilirubin: 0.5 mg/dL (ref 0.3–1.2)
Total Protein: 8.1 g/dL (ref 6.0–8.3)

## 2013-10-27 LAB — SALICYLATE LEVEL

## 2013-10-27 LAB — CBG MONITORING, ED: GLUCOSE-CAPILLARY: 97 mg/dL (ref 70–99)

## 2013-10-27 LAB — ETHANOL

## 2013-10-27 LAB — ACETAMINOPHEN LEVEL: Acetaminophen (Tylenol), Serum: <15 ug/mL (ref 10–30)

## 2013-10-27 MED ORDER — OLANZAPINE 10 MG IM SOLR: 10.0000 mg | Freq: Once | INTRAMUSCULAR | Status: AC | PRN

## 2013-10-27 MED ORDER — OLANZAPINE 10 MG PO TBDP
10.0000 mg | ORAL_TABLET | Freq: Once | ORAL | Status: AC
  Administered 2013-10-27: 10 mg via ORAL
  Filled 2013-10-27: qty 1

## 2013-10-27 MED ORDER — OLANZAPINE 10 MG IM SOLR: 10.0000 mg | Freq: Once | INTRAMUSCULAR | Status: DC

## 2013-10-27 MED ORDER — OLANZAPINE 10 MG PO TBDP
10.0000 mg | ORAL_TABLET | Freq: Three times a day (TID) | ORAL | Status: DC
  Administered 2013-10-27 – 2013-10-28 (×3): 10 mg via ORAL
  Filled 2013-10-27 (×3): qty 1

## 2013-10-27 NOTE — Progress Notes (Signed)
See ED Narrator event log regarding patient comments 5/22 at 1115 Hrs.
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
## 2013-10-27 NOTE — ED Notes (Signed)
Ashley intake staff at [facility] given report on patient and she reported that they have no beds at this time patient is on [employer] and that a bed might be available tomorrow.
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
## 2013-10-27 NOTE — ED Provider Notes (Signed)
CSN: [id number]     Arrival date & time 10/27/13  0300 History   First MD Initiated Contact with Patient 10/27/13 [phone]     Chief Complaint  Patient presents with  . Medical Clearance   HPI  History provided by the patient. Patient is a 43 year old male with history of hypertension and diabetes who presents with [employer] for suicidal ideations. Patient was found laying down in the street. He states he was feeling very depressed because his significant other was cheating on him. He states that he just wanted to die and was hoping that he would be runover. He denies any HI. Denies any hallucinations. Denies any other aggravating or alleviating factors. Does report having some problems with depression in the past. He has never been treated for this. No other aggravating or alleviating factors. No other associated symptoms.    Past Medical History  Diagnosis Date  . Diabetes mellitus   . Hypertension   . Diabetic neuropathy    Past Surgical History  Procedure Laterality Date  . Mouth surgery    . Tympanostomy tube placement     History reviewed. No pertinent family history. History  Substance Use Topics  . Smoking status: Current Every Day Smoker  . Smokeless tobacco: Not on file  . Alcohol Use: No    Review of Systems  All other systems reviewed and are negative.     Allergies  Review of patient's allergies indicates no known allergies.  Home Medications   Prior to Admission medications   Not on File   BP 100/66  Pulse 67  Temp(Src) 97.3 F (36.3 C) (Oral)  Resp 16  SpO2 99% Physical Exam  Nursing note and vitals reviewed. Constitutional: He is oriented to person, place, and time. He appears well-developed and well-nourished.  HENT:  Head: Normocephalic and atraumatic.  Cardiovascular: Normal rate and regular rhythm.   Pulmonary/Chest: Effort normal and breath sounds normal. No respiratory distress. He has no wheezes. He has no rales.  Abdominal: Soft.  Neurological:  He is alert and oriented to person, place, and time.  Skin: Skin is warm.  Psychiatric: He exhibits a depressed mood. He expresses suicidal ideation.    ED Course  Procedures   COORDINATION OF CARE:  Nursing notes reviewed. Vital signs reviewed. Initial pt interview and examination performed.   Filed Vitals:   10/27/13 0310 10/27/13 0558  BP: 116/76 100/66  Pulse: 73 67  Temp: 98.2 F (36.8 C) 97.3 F (36.3 C)  TempSrc: Oral Oral  Resp: 18 16  SpO2: 100% 99%   4:00AM patient seen and evaluated. Patient is calm and cooperative at this time. Does not appear in any acute distress.   Patient is medically cleared and stable for further psychiatric evaluation. Holding orders in place.   Results for orders placed during the hospital encounter of 10/27/13  ACETAMINOPHEN LEVEL      Result Value Ref Range   Acetaminophen (Tylenol), Serum <15.0  10 - 30 ug/mL  CBC      Result Value Ref Range   WBC 7.2  4.0 - 10.5 K/uL   RBC 5.77  4.22 - 5.81 MIL/uL   Hemoglobin 16.6  13.0 - 17.0 g/dL   HCT 11.948.3  14.739.0 - 82.952.0 %   MCV 83.7  78.0 - 100.0 fL   MCH 28.8  26.0 - 34.0 pg   MCHC 34.4  30.0 - 36.0 g/dL   RDW 56.212.4  13.011.5 - 86.515.5 %   Platelets 191  150 -  400 K/uL  COMPREHENSIVE METABOLIC PANEL      Result Value Ref Range   Sodium 140  137 - 147 mEq/L   Potassium 4.2  3.7 - 5.3 mEq/L   Chloride 98  96 - 112 mEq/L   CO2 28  19 - 32 mEq/L   Glucose, Bld 112 (*) 70 - 99 mg/dL   BUN 9  6 - 23 mg/dL   Creatinine, Ser 9.24  0.50 - 1.35 mg/dL   Calcium 9.7  8.4 - 26.8 mg/dL   Total Protein 8.1  6.0 - 8.3 g/dL   Albumin 3.8  3.5 - 5.2 g/dL   AST 26  0 - 37 U/L   ALT 26  0 - 53 U/L   Alkaline Phosphatase 111  39 - 117 U/L   Total Bilirubin 0.5  0.3 - 1.2 mg/dL   GFR calc non Af Amer >90  >90 mL/min   GFR calc Af Amer >90  >90 mL/min  ETHANOL      Result Value Ref Range   Alcohol, Ethyl (B) <11  0 - 11 mg/dL  SALICYLATE LEVEL      Result Value Ref Range   Salicylate Lvl <2.0 (*) 2.8 -  20.0 mg/dL  URINE RAPID DRUG SCREEN (HOSP PERFORMED)      Result Value Ref Range   Opiates NONE DETECTED  NONE DETECTED   Cocaine NONE DETECTED  NONE DETECTED   Benzodiazepines NONE DETECTED  NONE DETECTED   Amphetamines NONE DETECTED  NONE DETECTED   Tetrahydrocannabinol NONE DETECTED  NONE DETECTED   Barbiturates NONE DETECTED  NONE DETECTED       MDM   Final diagnoses:  Suicidal ideation        Angus Seller, PA-C 10/27/13 [phone]
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
## 2013-10-27 NOTE — Progress Notes (Signed)
[facility] provided pt with a [facility] Card application, highlighting [facility], to help patient establish primary care.  °

## 2013-10-27 NOTE — Consult Note (Signed)
Patient seen, evaluated and treatment plan formulated by me. Patient states that he's really angry, wants to kill himself, his wife and her boyfriend. He adds that nobody understands them, he states that he cannot control his anger. Patient's wife also has a restraining order against the patient. Patient needs inpatient treatment to help with his depression, suicidal and homicidal risk

## 2013-10-27 NOTE — Progress Notes (Signed)
Attempted to secure placement at the following facilities:  [facility]- Shelly- faxed information [facility]- Andrian- faxed information [facility]- Don- faxed information [facility]- Tom- faxed information [facility]- Brenda- no beds [facility]- Jessica- only male general  [facility]- Heathe- only male bed after 5pm [facility]- Sharita- no beds [facility]- Valerie- faxed information [facility]- Felecia- no beds [facility]- Barbara- only low acuity [facility]- Evelyn- faxed information [facility]- Jolene- no beds [facility]- left message [facility]- Jonathan- faxed information [facility]- left message   Richard [name], MSW, LCSWA, 10/27/2013 Evening Clinical Social Worker [phone]  
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
## 2013-10-28 NOTE — ED Notes (Signed)
Forde Dandy at [facility] was contacted to see if any paper work needed to sign and she reported no.
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
## 2013-10-28 NOTE — ED Notes (Signed)
Attempted to call report to [facility] refused to take until [employer] has here.

## 2013-10-28 NOTE — ED Notes (Signed)
Placed on transportation list.

## 2013-10-28 NOTE — ED Notes (Signed)
General information was given to Consuella Lose, RN at [facility] for consideration for possible admission. Consuella Lose, RN had question about how patient would be transported if accepted. Consuella Lose, RN  was told by Clinical research associate that she would check with TTS counselor and [facility] to see how patient would transported and call her back.Marland Kitchen

## 2013-10-28 NOTE — ED Notes (Signed)
Neurosurgeon here to transport to [employer] Belongings bag x2 given to deputy. Ambulatory without difficulty. Conversation limited. Report called to Dierdre Highman at [facility]

## 2013-10-28 NOTE — ED Notes (Signed)
[facility] TTS counselor asked patient if he ws willing to go [facility] on voluntary admission patient agreed.

## 2013-10-28 NOTE — ED Notes (Signed)
Left message on sheriff phone requesting transportation.

## 2013-10-28 NOTE — ED Notes (Signed)
Patient was accepted at [facility] and voluntary admission status. Accepting doctor Laurell Roof, MD.

## 2013-10-28 NOTE — ED Notes (Signed)
[facility] TTS Risk manager discuss the risk of sending patient voluntary to [facility] with him being SI and HI. [facility] counselor reported that he would discuss IVC with the emergency room doctor. Consuella Lose, RN was contacted and asked if she would accept patient on IVC status and she stated yes.

## 2013-10-31 NOTE — ED Provider Notes (Signed)
Medical screening examination/treatment/procedure(s) were performed by non-physician practitioner and as supervising physician I was immediately available for consultation/collaboration.   EKG Interpretation None        Audree Camel, MD 10/31/13 [phone]

## 2013-11-08 ENCOUNTER — Emergency Department (HOSPITAL_COMMUNITY)
Admission: EM | Admit: 2013-11-08 | Discharge: 2013-11-09 | Disposition: A | Discharge status: Other Acute Inpatient Hospital | Payer: Federal, State, Local not specified - Other | Attending: Dermatology | Admitting: Dermatology

## 2013-11-08 ENCOUNTER — Encounter (HOSPITAL_COMMUNITY): Payer: Self-pay | Admitting: Emergency Medicine

## 2013-11-08 DIAGNOSIS — F329 Major depressive disorder, single episode, unspecified: Secondary | ICD-10-CM

## 2013-11-08 DIAGNOSIS — E119 Type 2 diabetes mellitus without complications: Secondary | ICD-10-CM | POA: Insufficient documentation

## 2013-11-08 DIAGNOSIS — F17200 Nicotine dependence, unspecified, uncomplicated: Secondary | ICD-10-CM | POA: Insufficient documentation

## 2013-11-08 DIAGNOSIS — F3289 Other specified depressive episodes: Secondary | ICD-10-CM | POA: Insufficient documentation

## 2013-11-08 DIAGNOSIS — I10 Essential (primary) hypertension: Secondary | ICD-10-CM | POA: Insufficient documentation

## 2013-11-08 DIAGNOSIS — R45851 Suicidal ideations: Secondary | ICD-10-CM | POA: Insufficient documentation

## 2013-11-08 LAB — COMPREHENSIVE METABOLIC PANEL
ALT: 27 U/L (ref 0–53)
AST: 24 U/L (ref 0–37)
Albumin: 3.7 g/dL (ref 3.5–5.2)
Alkaline Phosphatase: 95 U/L (ref 39–117)
BUN: 12 mg/dL (ref 6–23)
CALCIUM: 9.4 mg/dL (ref 8.4–10.5)
CO2: 26 meq/L (ref 19–32)
Chloride: 106 mEq/L (ref 96–112)
Creatinine, Ser: 0.8 mg/dL (ref 0.50–1.35)
GFR calc Af Amer: >90 mL/min (ref >90)
Glucose, Bld: 136 mg/dL — ABNORMAL HIGH (ref 70–99)
Potassium: 4.2 mEq/L (ref 3.7–5.3)
SODIUM: 142 meq/L (ref 137–147)
TOTAL PROTEIN: 7.7 g/dL (ref 6.0–8.3)
Total Bilirubin: 0.5 mg/dL (ref 0.3–1.2)

## 2013-11-08 LAB — CBC
HCT: 44.4 % (ref 39.0–52.0)
Hemoglobin: 15.5 g/dL (ref 13.0–17.0)
MCH: 29.2 pg (ref 26.0–34.0)
MCHC: 34.9 g/dL (ref 30.0–36.0)
MCV: 83.8 fL (ref 78.0–100.0)
Platelets: 206 10*3/uL (ref 150–400)
RBC: 5.3 MIL/uL (ref 4.22–5.81)
RDW: 12.7 % (ref 11.5–15.5)
WBC: 6.5 10*3/uL (ref 4.0–10.5)

## 2013-11-08 LAB — RAPID URINE DRUG SCREEN, HOSP PERFORMED
Amphetamines: NOT DETECTED
Barbiturates: NOT DETECTED
Benzodiazepines: NOT DETECTED
Cocaine: NOT DETECTED
Opiates: NOT DETECTED
Tetrahydrocannabinol: NOT DETECTED

## 2013-11-08 LAB — SALICYLATE LEVEL

## 2013-11-08 LAB — ETHANOL: Alcohol, Ethyl (B): <11 mg/dL (ref 0–11)

## 2013-11-08 LAB — ACETAMINOPHEN LEVEL

## 2013-11-08 MED ORDER — IBUPROFEN 200 MG PO TABS: 600.0000 mg | ORAL_TABLET | Freq: Three times a day (TID) | ORAL | Status: DC | PRN

## 2013-11-08 MED ORDER — ACETAMINOPHEN 325 MG PO TABS
650.0000 mg | ORAL_TABLET | ORAL | Status: DC | PRN
Start: 2013-11-08 — End: 2013-11-09

## 2013-11-08 MED ORDER — LORAZEPAM 1 MG PO TABS
1.0000 mg | ORAL_TABLET | Freq: Three times a day (TID) | ORAL | Status: DC | PRN
  Administered 2013-11-08: 1 mg via ORAL
  Filled 2013-11-08: qty 1

## 2013-11-08 NOTE — ED Provider Notes (Signed)
CSN: [id number]     Arrival date & time 11/08/13  2000 History   First MD Initiated Contact with Patient 11/08/13 2010     Chief Complaint  Patient presents with  . Medical Clearance     (Consider location/radiation/quality/duration/timing/severity/associated sxs/prior Treatment) The history is provided by the patient.  pt with hx depression, anxiety, states he has had a great deal of time dealing w separation from spouse, which occurred approximately 1 yr ago. States whenever he thinks about situation, he gets very angry, depressed and has thoughts of suicide. States in the past he has had HI towards spouse and her significant others, but denies current thoughts of harm to others.  Denies any attempt to harm self. Was recently txd inpatient at [facility] for same - states he felt no better when left, that meds didn't help so he quit taking them.     Past Medical History  Diagnosis Date  . Diabetes mellitus   . Hypertension   . Diabetic neuropathy    Past Surgical History  Procedure Laterality Date  . Mouth surgery    . Tympanostomy tube placement     No family history on file. History  Substance Use Topics  . Smoking status: Current Every Day Smoker  . Smokeless tobacco: Not on file  . Alcohol Use: No    Review of Systems  Constitutional: Negative for fever and chills.  HENT: Negative for sore throat.   Eyes: Negative for redness.  Respiratory: Negative for shortness of breath.   Cardiovascular: Negative for chest pain.  Gastrointestinal: Negative for vomiting and abdominal pain.  Genitourinary: Negative for flank pain.  Musculoskeletal: Negative for back pain and neck pain.  Skin: Negative for rash.  Neurological: Negative for headaches.  Hematological: Does not bruise/bleed easily.  Psychiatric/Behavioral: Positive for suicidal ideas and dysphoric mood.      Allergies  Review of patient's allergies indicates no known allergies.  Home Medications   Prior  to Admission medications   Not on File   BP 141/87  Pulse 67  Temp(Src) 98 F (36.7 C) (Oral)  Resp 16  SpO2 99% Physical Exam  Nursing note and vitals reviewed. Constitutional: He is oriented to person, place, and time. He appears well-developed and well-nourished. No distress.  HENT:  Head: Atraumatic.  Mouth/Throat: Oropharynx is clear and moist.  Eyes: Conjunctivae are normal. Pupils are equal, round, and reactive to light. No scleral icterus.  Neck: Neck supple. No tracheal deviation present.  Cardiovascular: Normal rate, regular rhythm, normal heart sounds and intact distal pulses.   Pulmonary/Chest: Effort normal and breath sounds normal. No accessory muscle usage. No respiratory distress.  Abdominal: Soft. He exhibits no distension. There is no tenderness.  Musculoskeletal: Normal range of motion. He exhibits no edema.  Neurological: He is alert and oriented to person, place, and time.  Steady gait  Skin: Skin is warm and dry. He is not diaphoretic.  Psychiatric:  Depressed mood.     ED Course  Procedures (including critical care time) Labs Review Results for orders placed during the hospital encounter of 11/08/13  ACETAMINOPHEN LEVEL      Result Value Ref Range   Acetaminophen (Tylenol), Serum <15.0  10 - 30 ug/mL  CBC      Result Value Ref Range   WBC 6.5  4.0 - 10.5 K/uL   RBC 5.30  4.22 - 5.81 MIL/uL   Hemoglobin 15.5  13.0 - 17.0 g/dL   HCT 40.944.4  81.139.0 - 91.452.0 %  MCV 83.8  78.0 - 100.0 fL   MCH 29.2  26.0 - 34.0 pg   MCHC 34.9  30.0 - 36.0 g/dL   RDW 01.6  55.3 - 74.8 %   Platelets 206  150 - 400 K/uL  COMPREHENSIVE METABOLIC PANEL      Result Value Ref Range   Sodium 142  137 - 147 mEq/L   Potassium 4.2  3.7 - 5.3 mEq/L   Chloride 106  96 - 112 mEq/L   CO2 26  19 - 32 mEq/L   Glucose, Bld 136 (*) 70 - 99 mg/dL   BUN 12  6 - 23 mg/dL   Creatinine, Ser 2.70  0.50 - 1.35 mg/dL   Calcium 9.4  8.4 - 78.6 mg/dL   Total Protein 7.7  6.0 - 8.3 g/dL    Albumin 3.7  3.5 - 5.2 g/dL   AST 24  0 - 37 U/L   ALT 27  0 - 53 U/L   Alkaline Phosphatase 95  39 - 117 U/L   Total Bilirubin 0.5  0.3 - 1.2 mg/dL   GFR calc non Af Amer >90  >90 mL/min   GFR calc Af Amer >90  >90 mL/min  ETHANOL      Result Value Ref Range   Alcohol, Ethyl (B) <11  0 - 11 mg/dL  SALICYLATE LEVEL      Result Value Ref Range   Salicylate Lvl <2.0 (*) 2.8 - 20.0 mg/dL  URINE RAPID DRUG SCREEN (HOSP PERFORMED)      Result Value Ref Range   Opiates NONE DETECTED  NONE DETECTED   Cocaine NONE DETECTED  NONE DETECTED   Benzodiazepines NONE DETECTED  NONE DETECTED   Amphetamines NONE DETECTED  NONE DETECTED   Tetrahydrocannabinol NONE DETECTED  NONE DETECTED   Barbiturates NONE DETECTED  NONE DETECTED       MDM  Labs.  Reviewed nursing notes and prior charts for additional history.   Psych team consulted.  Pt states he may try to leave and kill self - ivc papers filled out.  Inpatient psych treatment per psych team.     Suzi Roots, MD 11/08/13 2211

## 2013-11-08 NOTE — ED Notes (Signed)
Patient sad, guarded. Endorses SI and AH. States "I don't want to answer because I might incriminate myself" when asked about HI. States he is scared and has "an urge to do something". States he is unsure of the voice he hears, thinks it may be the devil. Believes he can let staff know before acting on his urge or causing harm. Says his daughter are okay now with wife and her new man. Reports feeling hopeless with anxiety 5/10, depression 10/10. Says he is feeling "withdraw" from pain pills. States that next time "I won't call. I just can't keep going home".   Encouragement offered. Given water and Gatorade.  Patient safety maintained, Q 15 checks continue.   Berna Spare, TTS is in with patient at present.

## 2013-11-08 NOTE — ED Notes (Addendum)
Pt brought to ED by [employer] from [place] house with SI and ?HI after being removed from home/wife after having restraining order served on him by his wife. Per [employer] he did state when he thinks about situation he thinks about killing himself, [employer] states patient stated if someone doesn't do something he will hurt himself.  In speaking with patient, pt reports next time he will not call [employer] he will just "do it" pt states he can not watch his wife of 17 yrs be with another man. Pt states "the beast" he is trying to come out. Pt denies HI, pt will not confirm or deny hallucinations. Pt states if he is sent home today "we will see", pt does have plan of taking all his medications.  Pt recently here for same, pt was IVC at [facility] then sent to [facility] for inpatient treatment. Pt states he is not taking taking his medications because they do not help.

## 2013-11-08 NOTE — ED Notes (Signed)
Patient given Ativan for anxiety.

## 2013-11-08 NOTE — ED Notes (Signed)
Pt belongings taken to psy ed

## 2013-11-08 NOTE — ED Notes (Addendum)
Pt refusing to change into scrubs.  

## 2013-11-09 ENCOUNTER — Encounter (HOSPITAL_COMMUNITY): Payer: Self-pay | Admitting: *Deleted

## 2013-11-09 ENCOUNTER — Inpatient Hospital Stay (HOSPITAL_COMMUNITY)
Admission: AD | Admit: 2013-11-09 | Discharge: 2013-11-15 | DRG: 885 | Disposition: A | Discharge status: Home / Self Care | Payer: Federal, State, Local not specified - Other | Source: Intra-hospital | Attending: Psychiatry | Admitting: Psychiatry

## 2013-11-09 DIAGNOSIS — Z5900 Homelessness unspecified: Secondary | ICD-10-CM

## 2013-11-09 DIAGNOSIS — E1142 Type 2 diabetes mellitus with diabetic polyneuropathy: Secondary | ICD-10-CM | POA: Diagnosis present

## 2013-11-09 DIAGNOSIS — F17200 Nicotine dependence, unspecified, uncomplicated: Secondary | ICD-10-CM | POA: Diagnosis present

## 2013-11-09 DIAGNOSIS — R45851 Suicidal ideations: Secondary | ICD-10-CM

## 2013-11-09 DIAGNOSIS — G4700 Insomnia, unspecified: Secondary | ICD-10-CM | POA: Diagnosis present

## 2013-11-09 DIAGNOSIS — F411 Generalized anxiety disorder: Secondary | ICD-10-CM | POA: Diagnosis present

## 2013-11-09 DIAGNOSIS — F332 Major depressive disorder, recurrent severe without psychotic features: Principal | ICD-10-CM | POA: Diagnosis present

## 2013-11-09 DIAGNOSIS — E1149 Type 2 diabetes mellitus with other diabetic neurological complication: Secondary | ICD-10-CM | POA: Diagnosis present

## 2013-11-09 DIAGNOSIS — I10 Essential (primary) hypertension: Secondary | ICD-10-CM | POA: Diagnosis present

## 2013-11-09 DIAGNOSIS — F329 Major depressive disorder, single episode, unspecified: Secondary | ICD-10-CM

## 2013-11-09 LAB — CBG MONITORING, ED: GLUCOSE-CAPILLARY: 154 mg/dL — AB (ref 70–99)

## 2013-11-09 MED ORDER — NICOTINE POLACRILEX 2 MG MT GUM: 2.0000 mg | CHEWING_GUM | OROMUCOSAL | Status: DC | PRN

## 2013-11-09 MED ORDER — ESCITALOPRAM OXALATE 10 MG PO TABS: 20.0000 mg | ORAL_TABLET | Freq: Every day | ORAL | Status: DC

## 2013-11-09 MED ORDER — ALUM & MAG HYDROXIDE-SIMETH 200-200-20 MG/5ML PO SUSP: 30.0000 mL | ORAL | Status: DC | PRN

## 2013-11-09 MED ORDER — MAGNESIUM HYDROXIDE 400 MG/5ML PO SUSP: 30.0000 mL | Freq: Every day | ORAL | Status: DC | PRN

## 2013-11-09 MED ORDER — ESCITALOPRAM OXALATE 20 MG PO TABS
20.0000 mg | ORAL_TABLET | Freq: Every day | ORAL | Status: DC
  Administered 2013-11-09: 20 mg via ORAL
  Filled 2013-11-09 (×2): qty 1
  Filled 2013-11-09: qty 2

## 2013-11-09 MED ORDER — DIVALPROEX SODIUM ER 500 MG PO TB24: 1000.0000 mg | ORAL_TABLET | Freq: Every day | ORAL | Status: DC

## 2013-11-09 MED ORDER — DIVALPROEX SODIUM ER 500 MG PO TB24
1000.0000 mg | ORAL_TABLET | Freq: Every day | ORAL | Status: DC
  Administered 2013-11-09: 1000 mg via ORAL
  Filled 2013-11-09 (×3): qty 2

## 2013-11-09 MED ORDER — ACETAMINOPHEN 325 MG PO TABS: 650.0000 mg | ORAL_TABLET | Freq: Four times a day (QID) | ORAL | Status: DC | PRN
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
## 2013-11-09 NOTE — Consult Note (Signed)
  Review of Systems  Constitutional: Negative.   HENT: Negative.   Eyes: Negative.   Respiratory: Negative.   Cardiovascular: Negative.   Gastrointestinal: Negative.   Musculoskeletal: Negative.   Skin: Negative.   Neurological: Negative.   Psychiatric/Behavioral: Positive for depression and suicidal ideas.   Denies any medical or physical complaints

## 2013-11-09 NOTE — Progress Notes (Signed)
[facility] CL provided patient with a list of primary care resources, highlighting [facility].

## 2013-11-09 NOTE — BHH Counselor (Addendum)
Regan Lemming at capacity [facility] at capacity.  Evette Cristal, [place] Assessment Counselor 1400   Doug at [facility] - they don't have pt's referral. Writer faxed referral again. Evette Cristal, [place]   Assessment Counselor 1339   Dondra Spry at [facility] - pt is still under review.  Evette Cristal, [place] Assessment Counselor
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
## 2013-11-09 NOTE — BHH Counselor (Signed)
Per Thurman Coyer [facility], pt accepted to bed 508-2. Support paperwork signed and faxed to [facility]. [employer] placed in pt's chart. Pt's RN Alcario Drought to arrange transportation.    Evette Cristal, [place] Assessment Counselor
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
## 2013-11-09 NOTE — BH Assessment (Signed)
Per Keneisha Herbin, AC at [facility], adult unit is currently at capacity. Contacted the following facilities for placement:   AT CAPACITY:  [facility]: Per Margaret  [facility]: Per Regina  [facility]: Per Patricia   [facility]: Per David  [facility]: Per Scarlet  [facility]: Per John  [facility]: Per Teresa  [facility]: Per Teresa  [facility]: Per Stephanie  [facility]: Per Cheryl   BED AVAILABLE. FAXED CLINICAL INFORMATION:  [facility]  [facility]   Stephanieann Popescu Ellis Calyssa Zobrist Jr, LPC, NCC Triage Specialist [phone]   
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
## 2013-11-09 NOTE — Tx Team (Signed)
Initial Interdisciplinary Treatment Plan  PATIENT STRENGTHS: (choose at least two) Average or above average intelligence Capable of independent living Communication skills  PATIENT STRESSORS: Substance abuse Traumatic event   PROBLEM LIST: Problem List/Patient Goals Date to be addressed Date deferred Reason deferred Estimated date of resolution  Substance abuse 11/09/2013     Depression 11/09/2013     Family conflect 11/09/2013                                           DISCHARGE CRITERIA:  Ability to meet basic life and health needs Improved stabilization in mood, thinking, and/or behavior Motivation to continue treatment in a less acute level of care Withdrawal symptoms are absent or subacute and managed without 24-hour nursing intervention  PRELIMINARY DISCHARGE PLAN: Attend 12-step recovery group Outpatient therapy  PATIENT/FAMIILY INVOLVEMENT: This treatment plan has been presented to and reviewed with the patient, Richard [name].  The patient and family have been given the opportunity to ask questions and make suggestions.  Richard [name] 11/09/2013, 6:52 PM
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
## 2013-11-10 MED ORDER — RISPERIDONE 1 MG PO TABS
1.0000 mg | ORAL_TABLET | Freq: Four times a day (QID) | ORAL | Status: DC
  Administered 2013-11-10 – 2013-11-11 (×6): 1 mg via ORAL
  Filled 2013-11-10 (×12): qty 1

## 2013-11-10 MED ORDER — RISPERIDONE 2 MG PO TABS: 2.0000 mg | ORAL_TABLET | Freq: Four times a day (QID) | ORAL | Status: DC

## 2013-11-10 MED ORDER — ESCITALOPRAM OXALATE 20 MG PO TABS
20.0000 mg | ORAL_TABLET | Freq: Every day | ORAL | Status: DC
  Administered 2013-11-10 – 2013-11-14 (×5): 20 mg via ORAL
  Filled 2013-11-10 (×3): qty 1
  Filled 2013-11-10: qty 14
  Filled 2013-11-10 (×3): qty 1

## 2013-11-10 MED ORDER — DIVALPROEX SODIUM ER 500 MG PO TB24
1000.0000 mg | ORAL_TABLET | Freq: Every day | ORAL | Status: DC
  Administered 2013-11-10 – 2013-11-14 (×5): 1000 mg via ORAL
  Filled 2013-11-10 (×4): qty 2
  Filled 2013-11-10: qty 28
  Filled 2013-11-10 (×2): qty 2

## 2013-11-10 NOTE — Progress Notes (Signed)
Pt attended group 

## 2013-11-10 NOTE — [facility] Suicide Risk Assessment (Signed)
   Nursing information obtained from:    Demographic factors:    Current Mental Status:    Loss Factors:    Historical Factors:    Risk Reduction Factors:    Total Time spent with patient: 45 minutes  CLINICAL FACTORS:   Depression:   Recent sense of peace/wellbeing Unstable or Poor Therapeutic Relationship Previous Psychiatric Diagnoses and Treatments  Psychiatric Specialty Exam: Physical Exam  ROS  Blood pressure 125/78, pulse 76, temperature 98.2 F (36.8 C), temperature source Oral, resp. rate 18, height 5\' 9"  (1.753 m), weight 99.791 kg (220 lb).Body mass index is 32.47 kg/(m^2).  General Appearance: Casual and Guarded  Eye Contact::  Fair  Speech:  Clear and Coherent and Slow  Volume:  Decreased  Mood:  Anxious, Depressed, Hopeless and Worthless  Affect:  Depressed  Thought Process:  Coherent and Goal Directed  Orientation:  Full (Time, Place, and Person)  Thought Content:  WDL  Suicidal Thoughts:  Yes.  without intent/plan  Homicidal Thoughts:  No  Memory:  Immediate;   Fair  Judgement:  Impaired  Insight:  Lacking  Psychomotor Activity:  Decreased  Concentration:  Fair  Recall:  [employer] of Knowledge:Good  Language: Good  Akathisia:  NA  Handed:  Right  AIMS (if indicated):     Assets:  Communication Skills Desire for Improvement Leisure Time Physical Health Resilience Social Support Talents/Skills  Sleep:  Number of Hours: 6.25   Musculoskeletal: Strength & Muscle Tone: within normal limits Gait & Station: normal Patient leans: N/A  COGNITIVE FEATURES THAT CONTRIBUTE TO RISK:  Closed-mindedness Loss of executive function Polarized thinking    SUICIDE RISK:   Moderate:  Frequent suicidal ideation with limited intensity, and duration, some specificity in terms of plans, no associated intent, good self-control, limited dysphoria/symptomatology, some risk factors present, and identifiable protective factors, including available and accessible social  support.  PLAN OF CARE: Admit for crisis stabilization, safety monitoring and medication management of major depressive disorder with suicidal ideation and plans.  I certify that inpatient services furnished can reasonably be expected to improve the patient's condition.  Richard [name] 11/10/2013, 12:16 PM
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
## 2013-11-10 NOTE — Progress Notes (Signed)
D) Pt has been attending the groups and interacting with his peers. Rates his depression at a 7 and his hopelessness at a 10. Affect is flat and mood depressed. Denies SI and HI. Presently. States, "I am feeling very depressed. I had a bad dream a little while ago and it is making me feel sad".  Pt denies auditory and visual hallucinations. A) given support, reassurance and praise. Encouraged to attend the groups.  R) Denies SI and HI.
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
## 2013-11-10 NOTE — [facility] Suicide Risk Assessment (Signed)
[facility] INPATIENT:  Family/Significant Other Suicide Prevention Education  Suicide Prevention Education:  Patient Refusal for Family/Significant Other Suicide Prevention Education: The patient Richard [name] has refused to provide written consent for family/significant other to be provided Family/Significant Other Suicide Prevention Education during admission and/or prior to discharge.  Physician notified.  Siddhanth Denk Hairston Elira Colasanti 11/10/2013, 12:27 PM

## 2013-11-10 NOTE — Progress Notes (Signed)
D: Pt denies SI/HI/AVH at this time. Pt up to answer few questions and take his medication, then back to sleep.  A: Pt was offered support and encouragement. Pt was given scheduled medications. Pt was encourage to attend groups. Q 15 minute checks were done for safety.   R: Pt is taking medication. Pt has no complaints.Pt receptive to treatment and safety maintained on unit.

## 2013-11-11 LAB — GLUCOSE, CAPILLARY
Glucose-Capillary: 145 mg/dL — ABNORMAL HIGH (ref 70–99)
Glucose-Capillary: 135 mg/dL — ABNORMAL HIGH (ref 70–99)

## 2013-11-11 MED ORDER — LISINOPRIL 5 MG PO TABS
2.5000 mg | ORAL_TABLET | Freq: Every day | ORAL | Status: DC
  Administered 2013-11-11 – 2013-11-15 (×5): 2.5 mg via ORAL
  Filled 2013-11-11 (×5): qty 1
  Filled 2013-11-11: qty 7
  Filled 2013-11-11: qty 1

## 2013-11-11 MED ORDER — RISPERIDONE 1 MG PO TABS
1.0000 mg | ORAL_TABLET | ORAL | Status: DC
  Administered 2013-11-11 – 2013-11-15 (×8): 1 mg via ORAL
  Filled 2013-11-11: qty 1
  Filled 2013-11-11: qty 28
  Filled 2013-11-11: qty 1
  Filled 2013-11-11: qty 28
  Filled 2013-11-11 (×8): qty 1

## 2013-11-11 MED ORDER — RISPERIDONE 1 MG PO TABS: 1.0000 mg | ORAL_TABLET | Freq: Every evening | ORAL | Status: DC | PRN

## 2013-11-11 NOTE — [facility] Group Notes (Signed)
[facility] Group Notes:  (Clinical Social Work)  11/11/2013   3-4PM  Summary of Progress/Problems:   The main focus of today's process group was for the patient to identify ways in which they have sabotaged their own mental health wellness/recovery.  Motivational interviewing and a handout were used to explore the benefits and costs of their self-sabotaging behavior as well as the benefits and costs of changing this behavior.  The Stages of Change were explained to the group using a handout, and patients identified where they are with regard to changing self-defeating behaviors.  The patient expressed that he only wanted to listen, and would not speak.  Type of Therapy:  Process Group  Participation Level:  Mimimal  Participation Quality:  Attentive  Affect:  Blunted  Cognitive:  Alert  Insight:  Unknown, as he would not share  Engagement in Therapy: Limited  Modes of Intervention:  Education, Motivational Interviewing   Ambrose Mantle, LCSW 11/11/2013, 4:00pm

## 2013-11-11 NOTE — Progress Notes (Signed)
[facility] MD Progress Note  11/11/2013 3:15 PM Aarit Depner  MRN:  [id number] Subjective:  Slept well, appetite is good. He is attending groups, albeit reluctantly. Today he says he has been spending time talking with one of the MHTs and he feels much better having been able to discuss his feelings over his situation. He states he knows he will be ok, and he's ready to move on with his life. Diagnosis:   DSM5: Schizophrenia Disorders:   Obsessive-Compulsive Disorders:   Trauma-Stressor Disorders:   Substance/Addictive Disorders:   Depressive Disorders:  Major Depressive Disorder - with Psychotic Features (296.24) Total Time spent with patient: 20 minutes  ADL's:  Intact  Sleep: Good  Appetite:  Good  Suicidal Ideation:  denies Homicidal Ideation:  denies AEB (as evidenced by):  Psychiatric Specialty Exam: Physical Exam  ROS  Blood pressure 123/83, pulse 64, temperature 97.5 F (36.4 C), temperature source Oral, resp. rate 24, height 5\' 9"  (1.753 m), weight 99.791 kg (220 lb).Body mass index is 32.47 kg/(m^2).  General Appearance: Fairly Groomed  Patent attorney::  Good  Speech:  Clear and Coherent  Volume:  Normal  Mood:  Anxious  Affect:  Constricted  Thought Process:  Goal Directed  Orientation:  Full (Time, Place, and Person)  Thought Content:  WDL  Suicidal Thoughts:  No  Homicidal Thoughts:  No  Memory:  NA  Judgement:  Fair  Insight:  Shallow  Psychomotor Activity:  Normal  Concentration:  Fair  Recall:  Good  Fund of Knowledge:Fair  Language: Good  Akathisia:  No  Handed:  Right  AIMS (if indicated):     Assets:  Communication Skills Desire for Improvement Housing  Sleep:  Number of Hours: 6.5   Musculoskeletal: Strength & Muscle Tone: within normal limits Gait & Station: normal Patient leans: N/A  Current Medications: Current Facility-Administered Medications  Medication Dose Route Frequency Provider Last Rate Last Dose  . acetaminophen (TYLENOL) tablet  650 mg  650 mg Oral Q6H PRN Shuvon Rankin, NP      . alum & mag hydroxide-simeth (MAALOX/MYLANTA) 200-200-20 MG/5ML suspension 30 mL  30 mL Oral Q4H PRN Shuvon Rankin, NP      . divalproex (DEPAKOTE ER) 24 hr tablet 1,000 mg  1,000 mg Oral QHS Verne Spurr, PA-C   1,000 mg at 11/10/13 2225  . escitalopram (LEXAPRO) tablet 20 mg  20 mg Oral QHS Verne Spurr, PA-C   20 mg at 11/10/13 2225  . magnesium hydroxide (MILK OF MAGNESIA) suspension 30 mL  30 mL Oral Daily PRN Shuvon Rankin, NP      . nicotine polacrilex (NICORETTE) gum 2 mg  2 mg Oral PRN Nehemiah Settle, MD      . risperiDONE (RISPERDAL) tablet 1 mg  1 mg Oral Q6H Verne Spurr, PA-C   1 mg at 11/11/13 1233    Lab Results: No results found for this or any previous visit (from the past 48 hour(s)).  Physical Findings: AIMS: Facial and Oral Movements Muscles of Facial Expression: None, normal Lips and Perioral Area: None, normal Jaw: None, normal Tongue: None, normal,Extremity Movements Upper (arms, wrists, hands, fingers): None, normal Lower (legs, knees, ankles, toes): None, normal, Trunk Movements Neck, shoulders, hips: None, normal, Overall Severity Severity of abnormal movements (highest score from questions above): None, normal Incapacitation due to abnormal movements: None, normal Patient's awareness of abnormal movements (rate only patient's report): No Awareness, Dental Status Current problems with teeth and/or dentures?: No Does patient usually wear dentures?: No  CIWA:    COWS:     Treatment Plan Summary: Daily contact with patient to assess and evaluate symptoms and progress in treatment Medication management  Plan: 1. Continue crisis management and stabilization. 2. Medication management to reduce current symptoms to base line and improve patient's overall level of functioning 3. Treat health problems as indicated. 4. Develop treatment plan to decrease risk of relapse upon discharge and the need for      readmission. 5. Psycho-social education regarding relapse prevention and self care. 6. Health care follow up as needed for medical problems. 7. Continue home medications where appropriate. 8. Disposition is in progress. 9. WIll need depakote level on Monday. 10 CBGs ordered, HgbA1c ordered. 11. Started Lisinopril for his HTN. Medical Decision Making Problem Points:  Established problem, stable/improving (1) Data Points:  Review or order medicine tests (1)  I certify that inpatient services furnished can reasonably be expected to improve the patient's condition.  Rona RavensNeil T. Preslea Rhodus RPAC 3:22 PM 11/11/2013

## 2013-11-11 NOTE — Progress Notes (Signed)
Patient ID: Richard [name], male   DOB: [birth year], 43 y.o.   MRN: [id number]  D: Watt denied SI, HI, a/v disturbances, and pain. He contracted for safety. He said he had not had a BM in three days. He went to bed before 2200 and was reluctant to get up for HS medications. "I don't have to take them, do I?" Once up, he was more pleasant and forthcoming. He appears eager to be discharged.  A: Medications given as ordered. Richard [name] was offered a PRN for constipation, but he was afraid it would interfere with his sleep. He was urged to drink plenty of fluids. Brook, RN, received clarification from on-call provider regarding timing of patient's HS medications.   R: The importance of taking prescribed medications was emphasized. Safety maintained.

## 2013-11-11 NOTE — Progress Notes (Signed)
D) Pt has been in the milieu briefly today. Upset and angry, but would not express any of his thoughts or feelings. Was up the hall looking out the window. Questioning whether or not Pt is paranoid and responding to internal stimuli? Denies it all to this Clinical research associate. Came into a group today and couldn't make up his mind where to sit. Again appearing paranoid. A) Pt given distance since he is paranoid at this point. When interacting with Pt gentle and non confronting. Offered 1:1 frequently. R) Presently denies SI and HI.

## 2013-11-11 NOTE — Progress Notes (Signed)
Psychoeducational Group Note  Date: 11/11/2013 Time:  1015  Group Topic/Focus:  Identifying Needs:   The focus of this group is to help patients identify their personal needs that have been historically problematic and identify healthy behaviors to address their needs.  Participation Level:  Active  Participation Quality:  Appropriate  Affect:  Appropriate  Cognitive:  Oriented  Insight:  Improving  Engagement in Group:  Engaged  Additional Comments:  Pt was attentive and participated in the group. Shared good ideas.  [place] A  

## 2013-11-11 NOTE — [facility] Group Notes (Signed)
0900 orientation group  The focus of this group is to educate the patient on the purpose and policies of crisis stabilization and provide a format to answer questions about their admission.  The group details unit policies and expectations of patients while admitted.   Pt did not attend he was in his bed.

## 2013-11-12 DIAGNOSIS — F323 Major depressive disorder, single episode, severe with psychotic features: Secondary | ICD-10-CM

## 2013-11-12 LAB — GLUCOSE, CAPILLARY
Glucose-Capillary: 126 mg/dL — ABNORMAL HIGH (ref 70–99)
Glucose-Capillary: 176 mg/dL — ABNORMAL HIGH (ref 70–99)

## 2013-11-12 LAB — HEMOGLOBIN A1C
HEMOGLOBIN A1C: 6.6 % — AB (ref <5.7)
Mean Plasma Glucose: 143 mg/dL — ABNORMAL HIGH (ref <117)

## 2013-11-12 NOTE — [facility] Group Notes (Signed)
[facility] Group Notes:  (Clinical Social Work)  11/12/2013   1:15-2:15PM  Summary of Progress/Problems:  The main focus of today's process group was to   identify the patient's current support system and decide on other supports that can be put in place.  The picture on workbook was used to discuss why additional supports are needed.  An emphasis was placed on using counselor, doctor, therapy groups, 12-step groups, and problem-specific support groups to expand supports.   There was also an extensive discussion about what constitutes a healthy support versus an unhealthy support.  The patient shared his name at the beginning of group, then fell asleep and was drowsy throughout the remainder of group.  Type of Therapy:  Process Group  Participation Level:  Minimal  Participation Quality:  Drowsy  Affect:  Blunted  Cognitive:  Asleep  Insight:  None  Engagement in Therapy:  None  Modes of Intervention:  Education,  Support and [employer], LCSW 11/12/2013, 4:00pm

## 2013-11-12 NOTE — Progress Notes (Signed)
Patient ID: Richard [name], male   DOB: 01-12-1971, 43 y.o.   MRN: [id number] [facility] MD Progress Note  11/12/2013 6:14 PM Richard [name]  MRN:  [id number] Subjective:  Richard [name] is up and active on the unit. He is doing well and says he is ready to move on. He is less guarded today, smiles easily. He reports no problems with the medication. States his depression is 0/10, his anxiety is also low. He denies SI/HI or AVH. He is asking when he will be discharged. He has no new complaints and feels that he has made progress. Diagnosis:   DSM5: Schizophrenia Disorders:   Obsessive-Compulsive Disorders:   Trauma-Stressor Disorders:   Substance/Addictive Disorders:   Depressive Disorders:  Major Depressive Disorder - with Psychotic Features (296.24) Total Time spent with patient: 20 minutes  ADL's:  Intact  Sleep: Good  Appetite:  Good  Suicidal Ideation:  denies Homicidal Ideation:  denies AEB (as evidenced by):  Psychiatric Specialty Exam: Physical Exam  ROS  Blood pressure 118/77, pulse 68, temperature 98.1 F (36.7 C), temperature source Oral, resp. rate 20, height 5\' 9"  (1.753 m), weight 99.791 kg (220 lb).Body mass index is 32.47 kg/(m^2).  General Appearance: Fairly Groomed  Patent attorney::  Good  Speech:  Clear and Coherent  Volume:  Normal  Mood:  Anxious  Affect:  Constricted  Thought Process:  Goal Directed  Orientation:  Full (Time, Place, and Person)  Thought Content:  WDL  Suicidal Thoughts:  No  Homicidal Thoughts:  No  Memory:  NA  Judgement:  Fair  Insight:  Shallow  Psychomotor Activity:  Normal  Concentration:  Fair  Recall:  Good  Fund of Knowledge:Fair  Language: Good  Akathisia:  No  Handed:  Right  AIMS (if indicated):     Assets:  Communication Skills Desire for Improvement Housing  Sleep:  Number of Hours: 6.5   Musculoskeletal: Strength & Muscle Tone: within normal limits Gait & Station: normal Patient leans: N/A  Current Medications: Current  Facility-Administered Medications  Medication Dose Route Frequency Provider Last Rate Last Dose  . acetaminophen (TYLENOL) tablet 650 mg  650 mg Oral Q6H PRN Shuvon Rankin, NP      . alum & mag hydroxide-simeth (MAALOX/MYLANTA) 200-200-20 MG/5ML suspension 30 mL  30 mL Oral Q4H PRN Shuvon Rankin, NP      . divalproex (DEPAKOTE ER) 24 hr tablet 1,000 mg  1,000 mg Oral QHS Verne Spurr, PA-C   1,000 mg at 11/11/13 2132  . escitalopram (LEXAPRO) tablet 20 mg  20 mg Oral QHS Verne Spurr, PA-C   20 mg at 11/11/13 2132  . lisinopril (PRINIVIL,ZESTRIL) tablet 2.5 mg  2.5 mg Oral Daily Verne Spurr, PA-C   2.5 mg at 11/12/13 0829  . magnesium hydroxide (MILK OF MAGNESIA) suspension 30 mL  30 mL Oral Daily PRN Shuvon Rankin, NP      . nicotine polacrilex (NICORETTE) gum 2 mg  2 mg Oral PRN Nehemiah Settle, MD      . risperiDONE (RISPERDAL) tablet 1 mg  1 mg Oral BH-qamhs Verne Spurr, PA-C   1 mg at 11/12/13 7654  . risperiDONE (RISPERDAL) tablet 1 mg  1 mg Oral QHS PRN Verne Spurr, PA-C        Lab Results:  Results for orders placed during the hospital encounter of 11/09/13 (from the past 48 hour(s))  GLUCOSE, CAPILLARY     Status: Abnormal   Collection Time    11/11/13  5:26 PM  Result Value Ref Range   Glucose-Capillary 145 (*) 70 - 99 mg/dL   Comment 1 Documented in Chart     Comment 2 Notify RN    HEMOGLOBIN A1C     Status: Abnormal   Collection Time    11/11/13  7:40 PM      Result Value Ref Range   Hemoglobin A1C 6.6 (*) <5.7 %   Comment: (NOTE)                                                                               According to the ADA Clinical Practice Recommendations for 2011, when     HbA1c is used as a screening test:      >=6.5%   Diagnostic of Diabetes Mellitus               (if abnormal result is confirmed)     5.7-6.4%   Increased risk of developing Diabetes Mellitus     References:Diagnosis and Classification of Diabetes Mellitus,Diabetes      Care,2011,34(Suppl 1):S62-S69 and Standards of Medical Care in             Diabetes - 2011,Diabetes Care,2011,34 (Suppl 1):S11-S61.   Mean Plasma Glucose 143 (*) <117 mg/dL   Comment: Performed at [facility]  GLUCOSE, CAPILLARY     Status: Abnormal   Collection Time    11/11/13  8:40 PM      Result Value Ref Range   Glucose-Capillary 135 (*) 70 - 99 mg/dL   Comment 1 Notify RN    GLUCOSE, CAPILLARY     Status: Abnormal   Collection Time    11/12/13  6:15 AM      Result Value Ref Range   Glucose-Capillary 126 (*) 70 - 99 mg/dL    Physical Findings: AIMS: Facial and Oral Movements Muscles of Facial Expression: None, normal Lips and Perioral Area: None, normal Jaw: None, normal Tongue: None, normal,Extremity Movements Upper (arms, wrists, hands, fingers): None, normal Lower (legs, knees, ankles, toes): None, normal, Trunk Movements Neck, shoulders, hips: None, normal, Overall Severity Severity of abnormal movements (highest score from questions above): None, normal Incapacitation due to abnormal movements: None, normal Patient's awareness of abnormal movements (rate only patient's report): No Awareness, Dental Status Current problems with teeth and/or dentures?: No Does patient usually wear dentures?: No  CIWA:    COWS:     Treatment Plan Summary: Daily contact with patient to assess and evaluate symptoms and progress in treatment Medication management  Plan: Labs are reviewed and the patient is seen. Continue Lisinopril. Will continue to monitor CBGs and patient is to follow up with his PCP upon discharge.  Medical Decision Making Problem Points:  Established problem, stable/improving (1) Data Points:  Review or order medicine tests (1)  I certify that inpatient services furnished can reasonably be expected to improve the patient's condition.  Rona RavensNeil T. Mariadelosang Wynns RPAC 6:14 PM 11/12/2013

## 2013-11-12 NOTE — Progress Notes (Signed)
Psychoeducational Group Note  Date:  11/12/2013 Time: 1015 Group Topic/Focus:  Making Healthy Choices:   The focus of this group is to help patients identify negative/unhealthy choices they were using prior to admission and identify positive/healthier coping strategies to replace them upon discharge.  Participation Level:  Minimal  Participation Quality:  Resistant  Affect:  Flat  Cognitive:  Oriented  Insight:  Limited  Engagement in Group:  Limited  Additional Comments:    Joie Bimler Emori Mumme 5:52 PM. 11/12/2013

## 2013-11-12 NOTE — Progress Notes (Signed)
D) Pt has attended the groups and interacts with his peers. Pt tends to be quiet and withdrawn. Participated in the groups appropriately today. Not as angry today. Affect is more spontaneous today. Pt denies SI and HI. Rates his depression at a 1 and his hopelessness at a 2. A) Given support, reassurance and praise. Encouragement given.  R) Denies SI and HI

## 2013-11-12 NOTE — [facility] Group Notes (Signed)
0900 nursing orientation group  The focus of this group is to educate the patient on the purpose and policies of crisis stabilization and provide a format to answer questions about their admission.  The group details unit policies and expectations of patients while admitted.   Pt was appropriate and sharing in group.  He stated,"I don't feel alone when I have my family with me"

## 2013-11-12 NOTE — Progress Notes (Signed)
Adult Psychoeducational Group Note  Date:  11/12/2013 Time:  12:04 AM  Group Topic/Focus:  Wrap-Up Group:   The focus of this group is to help patients review their daily goal of treatment and discuss progress on daily workbooks.  Participation Level:  Active  Participation Quality:  Appropriate  Affect:  Appropriate  Cognitive:  Appropriate  Insight: Appropriate  Engagement in Group:  Engaged  Modes of Intervention:  Discussion  Additional Comments:  The patient express that he was able to have discussions with staff that made his day better.The patient said that staff made his day good.  Laneta Simmers Purvi Ruehl 11/12/2013, 12:04 AM

## 2013-11-13 LAB — GLUCOSE, CAPILLARY
GLUCOSE-CAPILLARY: 134 mg/dL — AB (ref 70–99)
Glucose-Capillary: 148 mg/dL — ABNORMAL HIGH (ref 70–99)

## 2013-11-13 LAB — VALPROIC ACID LEVEL: Valproic Acid Lvl: 41.2 ug/mL — ABNORMAL LOW (ref 50.0–100.0)

## 2013-11-13 NOTE — Progress Notes (Signed)
  D: Pt observed sleeping in bed with eyes closed. RR even and unlabored. No distress noted  .  A: Q 15 minute checks were done for safety.  R: safety maintained on unit.  

## 2013-11-13 NOTE — [facility] Group Notes (Signed)
[facility] LCSW Aftercare Discharge Planning Group Note   11/13/2013 9:42 AM    Participation Quality:  Appropraite  Mood/Affect:  Appropriate  Depression Rating:  1  Anxiety Rating:  1  Thoughts of Suicide:  No  Will you contract for safety?   NA  Current AVH:  No  Plan for Discharge/Comments:  Patient attended discharge planning group and actively participated in group.  He will follow up with [facility].  CSW provided all participants with daily workbook.   Transportation Means: Patient has transportation.   Supports:  Patient has a support system.   Cohen Doleman, Joesph July

## 2013-11-13 NOTE — Progress Notes (Signed)
D) Pt has been up in the milieu interacting with his peers. Affect and mood are improved from yesterday. Pt denies SI and HI, delusions and hallucinations. Pt states overall he is feeling much better. Rates his depression and hopelessness both at a 1 and denies SI and HI. A) Given support, reassurance and praise. Encouragement given. R) denies SI and HI.

## 2013-11-13 NOTE — Progress Notes (Signed)
[facility] MD Progress Note  11/13/2013 3:34 PM Richard [name]  MRN:  [id number] Subjective:  " I am getting better"  Objective :  I have discussed case with treatment team and have met with patient. Patient is a 43 year old man who presented with depression and SI. A stressor ( not new, occurred about a year ago) is separation from wife after infidelity on her part./ Patient is improving- staff report is that patient was depressed and irritable initially , but that he has " turned a corner" over the last day or two and is becoming more pleasant, better engaged in milieu, and presenting with an overall improved mood. Patient acknowledges that he is better, and is feeling more " accepting " of his circumstances and feels more ready to  " move on emotionally". His behavior on unit is in good control and he denies side effects from medications currently. Diagnosis:    AXIS I: MDD  AXIS II: Deferred  AXIS III:  Past Medical History   Diagnosis  Date   .  Diabetes mellitus    .  Hypertension    .  Diabetic neuropathy     AXIS IV: educational problems, problems related to social environment and problems with primary support group  AXIS V: 50    Total Time spent with patient: 20 minutes    ADL's:  improving  Sleep: NA  Appetite:  Good  Suicidal Ideation:  Denies any plan or intention of hurting self at this time and contracts for safety on the unit Homicidal Ideation:  Denies - specifically denies any HI or violent ideations towards wife or anyone else AEB (as evidenced by):  Psychiatric Specialty Exam: Physical Exam  Review of Systems  Constitutional: Negative for fever and chills.  Respiratory: Negative for shortness of breath.   Cardiovascular: Negative for chest pain.  Gastrointestinal: Negative for vomiting.  Psychiatric/Behavioral: Positive for depression.    Blood pressure 130/72, pulse 76, temperature 98.3 F (36.8 C), temperature source Oral, resp. rate 16, height '5\' 9"'   (1.753 m), weight 99.791 kg (220 lb).Body mass index is 32.47 kg/(m^2).  General Appearance: Well Groomed  Engineer, water::  Good  Speech:  Normal Rate  Volume:  Normal  Mood:  Depressed and but improving   Affect:  Appropriate and reactive  Thought Process:  Linear  Orientation:  Full (Time, Place, and Person)  Thought Content:  no psychotic symptoms  Suicidal Thoughts:  No  Homicidal Thoughts:  No  Memory:  NA  Judgement:  Good  Insight:  Fair  Psychomotor Activity:  Normal  Concentration:  Good  Recall:  Good  Fund of Knowledge:Good  Language: Good  Akathisia:  No  Handed:  Right  AIMS (if indicated):     Assets:  Desire for Improvement Resilience  Sleep:  Number of Hours: 5.75   Musculoskeletal: Strength & Muscle Tone: within normal limits Gait & Station: normal Patient leans: N/A  Current Medications: Current Facility-Administered Medications  Medication Dose Route Frequency Provider Last Rate Last Dose  . acetaminophen (TYLENOL) tablet 650 mg  650 mg Oral Q6H PRN Shuvon Rankin, NP      . alum & mag hydroxide-simeth (MAALOX/MYLANTA) 200-200-20 MG/5ML suspension 30 mL  30 mL Oral Q4H PRN Shuvon Rankin, NP      . divalproex (DEPAKOTE ER) 24 hr tablet 1,000 mg  1,000 mg Oral QHS Nena Polio, PA-C   1,000 mg at 11/12/13 2141  . escitalopram (LEXAPRO) tablet 20 mg  20 mg Oral  QHS Nena Polio, PA-C   20 mg at 11/12/13 2141  . lisinopril (PRINIVIL,ZESTRIL) tablet 2.5 mg  2.5 mg Oral Daily Nena Polio, PA-C   2.5 mg at 11/13/13 0919  . magnesium hydroxide (MILK OF MAGNESIA) suspension 30 mL  30 mL Oral Daily PRN Shuvon Rankin, NP      . nicotine polacrilex (NICORETTE) gum 2 mg  2 mg Oral PRN Durward Parcel, MD      . risperiDONE (RISPERDAL) tablet 1 mg  1 mg Oral BH-qamhs Nena Polio, PA-C   1 mg at 11/13/13 0919  . risperiDONE (RISPERDAL) tablet 1 mg  1 mg Oral QHS PRN Nena Polio, PA-C        Lab Results:  Results for orders placed during the hospital  encounter of 11/09/13 (from the past 48 hour(s))  GLUCOSE, CAPILLARY     Status: Abnormal   Collection Time    11/11/13  5:26 PM      Result Value Ref Range   Glucose-Capillary 145 (*) 70 - 99 mg/dL   Comment 1 Documented in Chart     Comment 2 Notify RN    HEMOGLOBIN A1C     Status: Abnormal   Collection Time    11/11/13  7:40 PM      Result Value Ref Range   Hemoglobin A1C 6.6 (*) <5.7 %   Comment: (NOTE)                                                                               According to the ADA Clinical Practice Recommendations for 2011, when     HbA1c is used as a screening test:      >=6.5%   Diagnostic of Diabetes Mellitus               (if abnormal result is confirmed)     5.7-6.4%   Increased risk of developing Diabetes Mellitus     References:Diagnosis and Classification of Diabetes Mellitus,Diabetes     KNLZ,7673,41(PFXTK 1):S62-S69 and Standards of Medical Care in             Diabetes - 2011,Diabetes Care,2011,34 (Suppl 1):S11-S61.   Mean Plasma Glucose 143 (*) <117 mg/dL   Comment: Performed at [facility], CAPILLARY     Status: Abnormal   Collection Time    11/11/13  8:40 PM      Result Value Ref Range   Glucose-Capillary 135 (*) 70 - 99 mg/dL   Comment 1 Notify RN    GLUCOSE, CAPILLARY     Status: Abnormal   Collection Time    11/12/13  6:15 AM      Result Value Ref Range   Glucose-Capillary 126 (*) 70 - 99 mg/dL  GLUCOSE, CAPILLARY     Status: Abnormal   Collection Time    11/12/13  9:43 PM      Result Value Ref Range   Glucose-Capillary 176 (*) 70 - 99 mg/dL  VALPROIC ACID LEVEL     Status: Abnormal   Collection Time    11/13/13  6:20 AM      Result Value Ref Range   Valproic Acid Lvl 41.2 (*) 50.0 - 100.0 ug/mL  Comment: Performed at [facility]    Physical Findings: AIMS: Facial and Oral Movements Muscles of Facial Expression: None, normal Lips and Perioral Area: None, normal Jaw: None, normal Tongue: None,  normal,Extremity Movements Upper (arms, wrists, hands, fingers): None, normal Lower (legs, knees, ankles, toes): None, normal, Trunk Movements Neck, shoulders, hips: None, normal, Overall Severity Severity of abnormal movements (highest score from questions above): None, normal Incapacitation due to abnormal movements: None, normal Patient's awareness of abnormal movements (rate only patient's report): No Awareness, Dental Status Current problems with teeth and/or dentures?: No Does patient usually wear dentures?: No  CIWA:    COWS:     Assessment- patient improving- less depressed, better range of affect, better related and less irritability. Tolerating medications well. Depakote level slightly subtherapeutic, but will continue current dose due to improvement on current dose and lack of side effects at current dose.   Treatment Plan Summary: Daily contact with patient to assess and evaluate symptoms and progress in treatment Medication management continue to provide milieu and support- work on discharge planning   Plan:as above  Medical Decision Making Problem Points:  Established problem, stable/improving (1) Data Points:  Review of medication regiment & side effects (2)  I certify that inpatient services furnished can reasonably be expected to improve the patient's condition.   Fernando Cobos 11/13/2013, 3:34 PM

## 2013-11-13 NOTE — Progress Notes (Signed)
Adult Psychoeducational Group Note  Date:  11/13/2013 Time:  10:35 PM  Group Topic/Focus:  Wrap-Up Group:   The focus of this group is to help patients review their daily goal of treatment and discuss progress on daily workbooks.  Participation Level:  Active  Participation Quality:  Appropriate and Attentive  Affect:  Appropriate  Cognitive:  Alert and Appropriate  Insight: Good  Engagement in Group:  Supportive  Modes of Intervention:  Support  Additional Comments:  Pt came to group and spoke well about staff and how much they have helped him, he said he couldn't wait till he is discharged  Delvecchio Madole R Shara Hartis 11/13/2013, 10:35 PM

## 2013-11-13 NOTE — Tx Team (Signed)
Interdisciplinary Treatment Plan Update   Date Reviewed:  11/13/2013  Time Reviewed:  10:22 AM  Progress in Treatment:   Attending groups: Yes Participating in groups: Yes Taking medication as prescribed: Yes  Tolerating medication: Yes Family/Significant other contact made:  No, patient declined collateral contact. Patient understands diagnosis: Yes  Discussing patient identified problems/goals with staff: Yes Medical problems stabilized or resolved: Yes Denies suicidal/homicidal ideation: Yes Patient has not harmed self or others: Yes  For review of initial/current patient goals, please see plan of care.  Estimated Length of Stay:  1-2 days  Reasons for Continued Hospitalization:  Anxiety Depression Medication stabilization   New Problems/Goals identified:    Discharge Plan or Barriers:   Home with outpatient follow up with [facility]  Additional Comments:  Attendees:  Patient:  11/13/2013 10:22 AM   Signature:  Sallyanne Havers, MD 11/13/2013 10:22 AM  Signature:  11/13/2013 10:22 AM  Signature:   Lamount Cranker, RN 11/13/2013 10:22 AM  Signature:   Quintella Reichert, RN 11/13/2013 10:22 AM  Signature    Lysbeth Penner, RN 11/13/2013 10:22 AM  Signature:  Juline Patch, LCSW 11/13/2013 10:22 AM  Signature:  Reyes Ivan, LCSW 11/13/2013 10:22 AM  Signature:  Leisa Lenz, Care Coordinator [facility] 11/13/2013 10:22 AM  Signature:   11/13/2013 10:22 AM  Signature:  11/13/2013  10:22 AM  Signature:   Onnie Boer, RN [facility] 11/13/2013  10:22 AM  Signature: 11/13/2013  10:22 AM    Scribe for Treatment Team:   Juline Patch,  11/13/2013 10:22 AM

## 2013-11-13 NOTE — Progress Notes (Signed)
D: Pt denies SI/HI/AVH. Pt is pleasant and cooperative. Pt smiled in front of writer first time since being admitted. Pt stated he feels a lot better, pt affect much brighter.   A: Pt was offered support and encouragement. Pt was given scheduled medications. Pt was encourage to attend groups. Q 15 minute checks were done for safety.   R:Pt attends groups and interacts well with peers and staff. Pt is taking medication. Pt has no complaints at this time.Pt receptive to treatment and safety maintained on unit.

## 2013-11-13 NOTE — [facility] Group Notes (Signed)
[facility] LCSW Group Therapy          Overcoming Obstacles       1:15 -2:30        11/13/2013       Type of Therapy:  Group Therapy  Participation Level:  Appropriate  Participation Quality:  Appropriate  Affect:  Appropriate, Alert  Cognitive:  Attentive Appropriate  Insight: Developing/Improving Engaged  Engagement in Therapy: Developing/Imprvoing Engaged  Modes of Intervention:  Discussion Exploration  Education Rapport BuildingProblem-Solving Support  Summary of Progress/Problems:  The main focus of today's group was overcoming obstacles.  He shared the obstacle he has to overcome is finding a good paying job.  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  Patient advised that if he does a good job through the [employer] that employer often hire permanent if the end of the temp contract.    Wynn Banker 11/13/2013

## 2013-11-14 LAB — GLUCOSE, CAPILLARY: GLUCOSE-CAPILLARY: 109 mg/dL — AB (ref 70–99)

## 2013-11-14 NOTE — Progress Notes (Signed)
D: Pt denies SI/HI/AVH. Pt is pleasant and cooperative. Pt said he was doing a little better, he said he feels fine.   A: Pt was offered support and encouragement. Pt was given scheduled medications. Pt was encourage to attend groups. Q 15 minute checks were done for safety.   R:Pt attends groups and interacts well with peers and staff. Pt is taking medication. Pt has no complaints at this time.Pt receptive to treatment and safety maintained on unit.

## 2013-11-14 NOTE — Progress Notes (Signed)
The focus of this group is to educate the patient on the purpose and policies of crisis stabilization and provide a format to answer questions about their admission.  The group details unit policies and expectations of patients while admitted. Patient attended this group. 

## 2013-11-14 NOTE — Progress Notes (Signed)
Pt attended spiritual care group on grief and loss facilitated by chaplain Burnis Kingfisher. Group opened with brief discussion and psycho-social ed around grief and loss in relationships and in relation to self - identifying life patterns, circumstances, changes that cause losses. Established group norm of speaking from own life experience. Group goal of establishing open and affirming space for members to share loss and experience with grief, normalize grief experience and provide psycho social education and grief support.   Richard [name] was present and attentive throughout group.  He did not contribute to group discussion.   Clover Mealy MDiv

## 2013-11-14 NOTE — Progress Notes (Signed)
Patient ID: Richard [name], male   DOB: [birth year], 43 y.o.   MRN: [id number] Patient currently asleep; no s/s of distress noted. Respirations regular and unlabored. No complaints at this time.

## 2013-11-14 NOTE — Progress Notes (Signed)
Patient ID: Richard [name], male   DOB: Aug 23, 1970, 43 y.o.   MRN: [id number] [facility] MD Progress Note  11/14/2013 4:23 PM Richard [name]  MRN:  [id number] Subjective:  Patient states he is feeling better, but  Not near baseline yet. He is concerned about shelter issues .  Objective: Essentially, he states he is staying in a good shelter, and is very concerned he might lose his bed there due to being admitted to hospital at this time. I did discuss this with Child psychotherapist, and patient will be given a document upon discharge certifying admission and discharge dates so he can insure ongoing shelter availability- patient reassured.  Patient is going to groups, and is not presenting with any disruptive behaviors on unit. He is tolerating medications well and denies side effects. He is on low dose Risperidone, on Lexapro, and on Depakote ER. At this time he is denying any side effects from medications and feels they are helping . We have reviewed side effect profiles.  Fingerstick today ( glucose) 109, Valproic Acid Serum level 41.2      Diagnosis:    AXIS I: MDD  AXIS II: Deferred  AXIS III:  Past Medical History   Diagnosis  Date   .  Diabetes mellitus    .  Hypertension    .  Diabetic neuropathy     AXIS IV: educational problems, problems related to social environment and problems with primary support group  AXIS V: 50    Total Time spent with patient: 20 minutes    ADL's:  improving  Sleep:  "OK"   Appetite:  Good  Suicidal Ideation:  Denies any plan or intention of hurting self at this time.  Homicidal Ideation:  Denies  Any homicidal ideations towards anyone at this time. AEB (as evidenced by):  Psychiatric Specialty Exam: Physical Exam  Review of Systems  Constitutional: Negative for fever and chills.  Respiratory: Negative for cough and shortness of breath.   Cardiovascular: Negative for chest pain.  Gastrointestinal: Negative for vomiting.  Psychiatric/Behavioral:  Positive for depression. Negative for suicidal ideas.    Blood pressure 122/76, pulse 91, temperature 98.3 F (36.8 C), temperature source Oral, resp. rate 18, height 5\' 9"  (1.753 m), weight 99.791 kg (220 lb).Body mass index is 32.47 kg/(m^2).  General Appearance: Well Groomed  Patent attorney::  Good  Speech:  Normal Rate  Volume:  Normal  Mood:  improving - less depressed,   Affect:  Appropriate, smiles often , appropriately   Thought Process:  Linear  Orientation:  Full (Time, Place, and Person)  Thought Content:  no psychotic symptoms  Suicidal Thoughts:  No  Homicidal Thoughts:  No  Memory:  NA  Judgement:  Good  Insight:  Fair  Psychomotor Activity:  Normal  Concentration:  Good  Recall:  Good  Fund of Knowledge:Good  Language: Good  Akathisia:  No  Handed:  Right  AIMS (if indicated):     Assets:  Desire for Improvement Resilience  Sleep:  Number of Hours: 5.75   Musculoskeletal: Strength & Muscle Tone: within normal limits Gait & Station: normal Patient leans: N/A  Current Medications: Current Facility-Administered Medications  Medication Dose Route Frequency Provider Last Rate Last Dose  . acetaminophen (TYLENOL) tablet 650 mg  650 mg Oral Q6H PRN Shuvon Rankin, NP      . alum & mag hydroxide-simeth (MAALOX/MYLANTA) 200-200-20 MG/5ML suspension 30 mL  30 mL Oral Q4H PRN Shuvon Rankin, NP      . divalproex (  DEPAKOTE ER) 24 hr tablet 1,000 mg  1,000 mg Oral QHS Verne Spurr, PA-C   1,000 mg at 11/13/13 2135  . escitalopram (LEXAPRO) tablet 20 mg  20 mg Oral QHS Verne Spurr, PA-C   20 mg at 11/13/13 2136  . lisinopril (PRINIVIL,ZESTRIL) tablet 2.5 mg  2.5 mg Oral Daily Verne Spurr, PA-C   2.5 mg at 11/14/13 0805  . magnesium hydroxide (MILK OF MAGNESIA) suspension 30 mL  30 mL Oral Daily PRN Shuvon Rankin, NP      . nicotine polacrilex (NICORETTE) gum 2 mg  2 mg Oral PRN Nehemiah Settle, MD      . risperiDONE (RISPERDAL) tablet 1 mg  1 mg Oral BH-qamhs  Verne Spurr, PA-C   1 mg at 11/14/13 0806  . risperiDONE (RISPERDAL) tablet 1 mg  1 mg Oral QHS PRN Verne Spurr, PA-C        Lab Results:  Results for orders placed during the hospital encounter of 11/09/13 (from the past 48 hour(s))  GLUCOSE, CAPILLARY     Status: Abnormal   Collection Time    11/12/13  9:43 PM      Result Value Ref Range   Glucose-Capillary 176 (*) 70 - 99 mg/dL  VALPROIC ACID LEVEL     Status: Abnormal   Collection Time    11/13/13  6:20 AM      Result Value Ref Range   Valproic Acid Lvl 41.2 (*) 50.0 - 100.0 ug/mL   Comment: Performed at [facility]  GLUCOSE, CAPILLARY     Status: Abnormal   Collection Time    11/13/13  4:50 PM      Result Value Ref Range   Glucose-Capillary 134 (*) 70 - 99 mg/dL   Comment 1 Notify RN    GLUCOSE, CAPILLARY     Status: Abnormal   Collection Time    11/13/13  7:48 PM      Result Value Ref Range   Glucose-Capillary 148 (*) 70 - 99 mg/dL  GLUCOSE, CAPILLARY     Status: Abnormal   Collection Time    11/14/13  6:27 AM      Result Value Ref Range   Glucose-Capillary 109 (*) 70 - 99 mg/dL    Physical Findings: AIMS: Facial and Oral Movements Muscles of Facial Expression: None, normal Lips and Perioral Area: None, normal Jaw: None, normal Tongue: None, normal,Extremity Movements Upper (arms, wrists, hands, fingers): None, normal Lower (legs, knees, ankles, toes): None, normal, Trunk Movements Neck, shoulders, hips: None, normal, Overall Severity Severity of abnormal movements (highest score from questions above): None, normal Incapacitation due to abnormal movements: None, normal Patient's awareness of abnormal movements (rate only patient's report): No Awareness, Dental Status Current problems with teeth and/or dentures?: No Does patient usually wear dentures?: No  CIWA:    COWS:     Assessment- patient continues to improve, but still feels subjectively depressed and not at baseline - his behavior is in  good control. Denies suicidal or homicidal ideations currently. Tolerating medications well. At this time expect short length of stay as patient continues to stabilize. Social Work working on Copy Summary: Daily contact with patient to assess and evaluate symptoms and progress in treatment Medication management continue to provide milieu and support- work on discharge planning  Continue current medication regimen at this time  Plan:as above  Medical Decision Making Problem Points:  Established problem, stable/improving (1) Data Points:  Review of medication regiment &  side effects (2)  I certify that inpatient services furnished can reasonably be expected to improve the patient's condition.   Lasheka Kempner 11/14/2013, 4:23 PM

## 2013-11-14 NOTE — Progress Notes (Signed)
D: Patient denies SI/HI and A/V hallucinations; patient reports sleep is fair; reports appetite is good; reports energy level is normal ; reports ability to pay attention is good; rates depression as 1/10; rates hopelessness 1/10  A: Monitored q 15 minutes; patient encouraged to attend groups; patient educated about medications; patient given medications per physician orders; patient encouraged to express feelings and/or concerns  R: Patient is animated and appropriate to circumstances; patient forwards little information; patient's interaction with staff and peers is appropriate; patient is taking medications as prescribed and tolerating medications

## 2013-11-14 NOTE — [facility] Group Notes (Signed)
[facility] LCSW Group Therapy      Feelings About Diagnosis 1:15 - 2:30 PM         11/14/2013    Type of Therapy:  Group Therapy  Participation Level:  Active  Participation Quality:  Appropriate  Affect:  Appropriate  Cognitive:  Alert and Appropriate  Insight:  Developing/Improving and Engaged  Engagement in Therapy:  Developing/Improving and Engaged  Modes of Intervention:  Discussion, Education, Exploration, Problem-Solving, Rapport Building, Support  Summary of Progress/Problems:  Patient actively participated in group. Patient discussed past and present diagnosis and the effects it has had on  life.  Patient talked about family and society being judgmental and the stigma associated with having a mental health diagnosis.  Patient shared he accepts his diagnosis and understands some days will be better than others.  Wynn Banker 11/14/2013

## 2013-11-14 NOTE — Progress Notes (Signed)
Adult Psychoeducational Group Note  Date:  11/14/2013 Time:  9:33 PM  Group Topic/Focus:  Wrap-Up Group:   The focus of this group is to help patients review their daily goal of treatment and discuss progress on daily workbooks.  Participation Level:  Active  Participation Quality:  Appropriate and Attentive  Affect:  Appropriate  Cognitive:  Alert and Appropriate  Insight: Appropriate and Good  Engagement in Group:  Supportive  Modes of Intervention:  Support  Additional Comments:  PT Was active in group and spoke well in group   Richard [name] R Richard [name] 11/14/2013, 9:33 PM

## 2013-11-14 NOTE — Progress Notes (Signed)
Recreation Therapy Notes  Animal-Assisted Activity/Therapy (AAA/T) Program Checklist/Progress Notes Patient Eligibility Criteria Checklist & Daily Group note for Rec Tx Intervention  Date: 06.09.2015 Time: 2:45pm Location: [address] Dayroom    AAA/T Program Assumption of Risk Form signed by Patient/ or Parent Legal Guardian yes  Patient is free of allergies or sever asthma yes  Patient reports no fear of animals yes  Patient reports no history of cruelty to animals yes   Patient understands his/her participation is voluntary yes  Patient washes hands before animal contact yes  Patient washes hands after animal contact yes  Behavioral Response: Appropriate   Education: Hand Washing, Appropriate Animal Interaction   Education Outcome: Acknowledges understanding  Clinical Observations/Feedback: Patient interacted appropriately with therapeutic dog team and peers.    Deanndra Kirley L Lauria Depoy, LRT/CTRS         Dashanti Burr L Terence Bart 11/14/2013 4:13 PM 

## 2013-11-15 DIAGNOSIS — F329 Major depressive disorder, single episode, unspecified: Secondary | ICD-10-CM

## 2013-11-15 LAB — GLUCOSE, CAPILLARY: GLUCOSE-CAPILLARY: 120 mg/dL — AB (ref 70–99)

## 2013-11-15 MED ORDER — DIVALPROEX SODIUM ER 500 MG PO TB24: 1000.0000 mg | ORAL_TABLET | Freq: Every day | ORAL | Status: DC

## 2013-11-15 MED ORDER — RISPERIDONE 1 MG PO TABS
ORAL_TABLET | ORAL | Status: DC
Start: 2013-11-15 — End: 2013-12-11

## 2013-11-15 MED ORDER — LISINOPRIL 2.5 MG PO TABS: 2.5000 mg | ORAL_TABLET | Freq: Every day | ORAL | Status: DC

## 2013-11-15 MED ORDER — RISPERIDONE 1 MG PO TABS: 1.0000 mg | ORAL_TABLET | ORAL | Status: DC

## 2013-11-15 MED ORDER — ESCITALOPRAM OXALATE 20 MG PO TABS: 20.0000 mg | ORAL_TABLET | Freq: Every day | ORAL | Status: DC

## 2013-11-15 NOTE — Progress Notes (Signed)
Patient discharged per physician order; patient denies SI/HI and A/V hallucinations; patient received samples, prescriptions, and copy of AVS after it was reviewed; patient had no other questions or concerns at this time; patient verbalized and signed that she received all belongings; patient left the unit ambulatory  

## 2013-11-15 NOTE — [facility] Suicide Risk Assessment (Signed)
Suicide Risk Assessment  Discharge Assessment     Demographic Factors:  Male  Total Time spent with patient: 45 minutes  Psychiatric Specialty Exam:     Blood pressure 117/79, pulse 69, temperature 98 F (36.7 C), temperature source Oral, resp. rate 18, height 5\' 9"  (1.753 m), weight 99.791 kg (220 lb).Body mass index is 32.47 kg/(m^2).  General Appearance: Fairly Groomed  Patent attorney::  Fair  Speech:  Clear and Coherent  Volume:  Normal  Mood:  Euthymic  Affect:  Appropriate  Thought Process:  Coherent and Goal Directed  Orientation:  Full (Time, Place, and Person)  Thought Content:  plans as he moves ahead  Suicidal Thoughts:  No  Homicidal Thoughts:  No  Memory:  Immediate;   Fair Recent;   Fair Remote;   Fair  Judgement:  Fair  Insight:  Present  Psychomotor Activity:  Normal  Concentration:  Fair  Recall:  [employer] of Knowledge:NA  Language: Fair  Akathisia:  No  Handed:    AIMS (if indicated):     Assets:  Desire for Improvement Vocational/Educational  Sleep:  Number of Hours: 6    Musculoskeletal: Strength & Muscle Tone: within normal limits Gait & Station: normal Patient leans: N/A   Mental Status Per Nursing Assessment::   On Admission:     Current Mental Status by Physician: In full contact with reality. There are no active SI plans or intent. 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  Patients presenting with no risk factors but with morbid ruminations; may be classified as minimal risk  based on the severity of the depressive symptoms  Discharge Diagnoses:   AXIS I:  Major Depressive Disorder AXIS II:  Deferred AXIS III:   Past Medical History  Diagnosis Date  . Diabetes mellitus   . Hypertension   . Diabetic neuropathy    AXIS IV:  other psychosocial or environmental problems AXIS V:  61-70 mild symptoms  Plan Of Care/Follow-up recommendations:  Activity:  as tolerated Diet:  regular Follow up outpatient basis Is patient on multiple antipsychotic therapies at discharge:  No   Has Patient had three or more failed trials of antipsychotic monotherapy by history:  No  Recommended Plan for Multiple Antipsychotic Therapies: NA    Ula Couvillon A 11/15/2013, 1:39 PM

## 2013-11-15 NOTE — Discharge Summary (Signed)
Physician Discharge Summary Note  Patient:  Richard [name] is an 43 y.o., male MRN:  [id number] DOB:  07/14/1970 Patient phone:  [phone] (home)  Patient address:   [address] [address],  Total Time spent with patient: Greater than 30 minutes  Date of Admission:  11/09/2013 Date of Discharge: 11/15/13  Reason for Admission: Mood stabilization  Discharge Diagnoses: Active Problems:   MDD (major depressive disorder)   Psychiatric Specialty Exam: Physical Exam  Psychiatric: His speech is normal and behavior is normal. Judgment normal. His mood appears not anxious. His affect is not angry, not blunt, not labile and not inappropriate. He is not actively hallucinating. Thought content is not paranoid and not delusional. Cognition and memory are normal. He does not exhibit a depressed mood. He expresses no homicidal and no suicidal ideation.    Review of Systems  Constitutional: Negative.   HENT: Negative.   Eyes: Negative.   Respiratory: Negative.   Cardiovascular: Negative.   Gastrointestinal: Negative.   Genitourinary: Negative.   Musculoskeletal: Negative.   Skin: Negative.   Neurological: Negative.   Endo/Heme/Allergies: Negative.   Psychiatric/Behavioral: Positive for depression (Stabilized with medication prior to discharge) and hallucinations (Stabilized with medication prior to discharge). Negative for suicidal ideas, memory loss and substance abuse. The patient has insomnia (Stabilized with medication prior to dicharge). The patient is not nervous/anxious.     Blood pressure 117/79, pulse 69, temperature 98 F (36.7 C), temperature source Oral, resp. rate 18, height '5\' 9"'  (1.753 m), weight 99.791 kg (220 lb).Body mass index is 32.47 kg/(m^2).  General Appearance: Fairly Groomed  Engineer, water:: Fair  Speech: Clear and Coherent  Volume: Normal  Mood: Euthymic  Affect: Appropriate  Thought Process: Coherent and Goal Directed  Orientation: Full (Time,  Place, and Person)  Thought Content: plans as he moves ahead  Suicidal Thoughts: No  Homicidal Thoughts: No Memory: Immediate; Fair  Recent; Fair  Remote; Fair  Judgement: Fair  Insight: Present  Psychomotor Activity: Normal  Concentration: Fair  Recall: [employer] of Knowledge:NA  Language: Fair  Akathisia: No  Handed:  AIMS (if indicated): Assets: Desire for Improvement  Vocational/Educational  Sleep: Number of Hours: 6   Past Psychiatric History: Diagnosis: MDD (major depressive disorder)  Hospitalizations: [facility] adult unit  Outpatient Care: [facility]  Substance Abuse Care: NA  Self-Mutilation: Denies  Suicidal Attempts: Denies  Violent Behaviors: NA   Musculoskeletal: Strength & Muscle Tone: within normal limits Gait & Station: normal Patient leans: N/A  DSM5: Schizophrenia Disorders:  NA Obsessive-Compulsive Disorders:  NA Trauma-Stressor Disorders:  NA Substance/Addictive Disorders:  NA Depressive Disorders:  MDD (major depressive disorder)  Axis Diagnosis:  AXIS I:  MDD (major depressive disorder) AXIS II:  Deferred AXIS III:   Past Medical History  Diagnosis Date  . Diabetes mellitus   . Hypertension   . Diabetic neuropathy    AXIS IV:  Mental illness, chronic AXIS V:  63  Level of Care:  OP  Hospital Course:  Patient is a 43 year old Separated AAM who presented to the [facility] reporting SI. He was just released from [facility] for the same about 2 weeks ago. He has not been compliant with his medication. He also endorses HI toward his wife but states he does not plan to act on this. He states that he hears voices but does not see visual hallucinations. He does not disclose what he hears, because he does not want to "get into all that."  While a patient in this hospital, [name] received medication management for his major depression. He was prescribed and received Lexapro 20 mg daily for depression, Risperdal 1 mg bid & Q bedtime for mood control,  Depakote ER 1000 mg twice daily for mood stabilization . He was also enrolled in the group counseling sessions and activities where he learned coping skills that should help him maintain stability after discharge. Patient also received medication management and monitoring for his other medical issues and concerns that he presented. He tolerated his treatment regimen without any significant adverse effects and or reactions. Depakote levels 41.2  Patient did respond well to his treatment regimen. This is evidenced by his daily reports of improved mood, reduction of symptoms and presentation of good affect/eye contact. Hatim met with his providers this am. His reason for admission, present symptoms, treatment plans and response to treatment regimen discussed. Deandra endorsed that he is doing well and stable for discharge. It was agreed upon that he will continue psychiatric care on an outpatient basis at the [facility] clinic here in [place], [place]. He is provided with all the necessary information needed to make this appointment without problems.  Upon discharge, patient adamantly denies suicidal, homicidal ideations, auditory, visual hallucinations and or delusional thinking. He received from [facility] a 2 weeks worth supply samples of his [facility] discharge medications. He left [facility] with all personal belongings via city bus transport. [place] provided him with bus pass.  Consults:  psychiatry  Significant Diagnostic Studies:  labs: CBC with diff, CMP, UDS, toxicology tests, U/A, Depakote levels.  Discharge Vitals:   Blood pressure 117/79, pulse 69, temperature 98 F (36.7 C), temperature source Oral, resp. rate 18, height '5\' 9"'  (1.753 m), weight 99.791 kg (220 lb). Body mass index is 32.47 kg/(m^2). Lab Results:   Results for orders placed during the hospital encounter of 11/09/13 (from the past 72 hour(s))  GLUCOSE, CAPILLARY     Status: Abnormal   Collection Time    11/12/13  9:43 PM      Result Value Ref Range    Glucose-Capillary 176 (*) 70 - 99 mg/dL  VALPROIC ACID LEVEL     Status: Abnormal   Collection Time    11/13/13  6:20 AM      Result Value Ref Range   Valproic Acid Lvl 41.2 (*) 50.0 - 100.0 ug/mL   Comment: Performed at [facility], CAPILLARY     Status: Abnormal   Collection Time    11/13/13  4:50 PM      Result Value Ref Range   Glucose-Capillary 134 (*) 70 - 99 mg/dL   Comment 1 Notify RN    GLUCOSE, CAPILLARY     Status: Abnormal   Collection Time    11/13/13  7:48 PM      Result Value Ref Range   Glucose-Capillary 148 (*) 70 - 99 mg/dL  GLUCOSE, CAPILLARY     Status: Abnormal   Collection Time    11/14/13  6:27 AM      Result Value Ref Range   Glucose-Capillary 109 (*) 70 - 99 mg/dL  GLUCOSE, CAPILLARY     Status: Abnormal   Collection Time    11/15/13  6:21 AM      Result Value Ref Range   Glucose-Capillary 120 (*) 70 - 99 mg/dL    Physical Findings: AIMS: Facial and Oral Movements Muscles of Facial Expression: None, normal Lips and Perioral Area: None, normal Jaw: None, normal Tongue: None,  normal,Extremity Movements Upper (arms, wrists, hands, fingers): None, normal Lower (legs, knees, ankles, toes): None, normal, Trunk Movements Neck, shoulders, hips: None, normal, Overall Severity Severity of abnormal movements (highest score from questions above): None, normal Incapacitation due to abnormal movements: None, normal Patient's awareness of abnormal movements (rate only patient's report): No Awareness, Dental Status Current problems with teeth and/or dentures?: No Does patient usually wear dentures?: No  CIWA:    COWS:     Psychiatric Specialty Exam: See Psychiatric Specialty Exam and Suicide Risk Assessment completed by Attending Physician prior to discharge.  Discharge destination:  Home  Is patient on multiple antipsychotic therapies at discharge:  No   Has Patient had three or more failed trials of antipsychotic monotherapy by history:   No  Recommended Plan for Multiple Antipsychotic Therapies: NA    Medication List       Indication   divalproex 500 MG 24 hr tablet  Commonly known as:  DEPAKOTE ER  Take 2 tablets (1,000 mg total) by mouth at bedtime. For mood stabilization   Indication:  Mood stabilization     escitalopram 20 MG tablet  Commonly known as:  LEXAPRO  Take 1 tablet (20 mg total) by mouth at bedtime. For depression   Indication:  Depression     lisinopril 2.5 MG tablet  Commonly known as:  PRINIVIL,ZESTRIL  Take 1 tablet (2.5 mg total) by mouth daily. For high blood pressure   Indication:  High Blood Pressure     risperiDONE 1 MG tablet  Commonly known as:  RISPERDAL  Take 1 mg twice daily and at bedtime: For mood control   Indication:  Mood control       Follow-up Information   Follow up with [facility] On 11/16/2013. (Please go to [facility]'s walk in clinic on Thursday, November 16, 2013 or any weekday between 8AM-3PM for mental health services.)    Contact information:   [address] [place], [place]   [address]  [phone]     Follow-up recommendations: Activity:  As tolerated Diet: As recommended by your primary care doctor. Keep all scheduled follow-up appointments as recommended.   Comments: Take all your medications as prescribed by your mental healthcare provider. Report any adverse effects and or reactions from your medicines to your outpatient provider promptly. Patient is instructed and cautioned to not engage in alcohol and or illegal drug use while on prescription medicines. In the event of worsening symptoms, patient is instructed to call the crisis hotline, 911 and or go to the nearest ED for appropriate evaluation and treatment of symptoms. Follow-up with your primary care provider for your other medical issues, concerns and or health care needs.     Total Discharge Time:  Greater than 30 minutes.  Signed: Encarnacion Slates, PMHNP-BC 11/15/2013, 2:39 PM  I personally  assessed the patient and formulated the plan Geralyn Flash A. Sabra Heck, M.D.

## 2013-11-15 NOTE — Progress Notes (Signed)
[facility] Adult Case Management Discharge Plan :  Will you be returning to the same living situation after discharge: Yes,  Patient is returning to his home. At discharge, do you have transportation home?:Yes,  Patient will be assisted with a bus pass. Do you have the ability to pay for your medications:No.  No, patient will be assisted with indigent medications.   Release of information consent forms completed and in the chart;  Patient's signature needed at discharge.  Patient to Follow up at: Follow-up Information   Follow up with [facility] On 11/16/2013. (Please go to [facility]'s walk in clinic on Thursday, November 16, 2013 or any weekday between 8AM-3PM for mental health services.)    Contact information:   [address] [place], [place]   [address]  [phone]      Patient denies SI/HI:   Patient no longer endorsing SI/HI or other thoughts of self harm.   Safety Planning and Suicide Prevention discussed:  .Reviewed with all patients during discharge planning group   Derriana Oser, [place] July 11/15/2013, 11:51 AM

## 2013-11-20 NOTE — Progress Notes (Signed)
Patient Discharge Instructions:  After Visit Summary (AVS):   Faxed to:  11/20/13 Discharge Summary Note:   Faxed to:  11/20/13 Psychiatric Admission Assessment Note:   Faxed to:  11/20/13 Suicide Risk Assessment - Discharge Assessment:   Faxed to:  11/20/13 Faxed/Sent to the Next Level Care provider:  11/20/13 [facility] to [facility] @ [phone]  Jerelene ReddenSheena E Maynard, 11/20/2013, 2:05 PM

## 2013-12-11 ENCOUNTER — Encounter (HOSPITAL_COMMUNITY): Payer: Self-pay | Admitting: Emergency Medicine

## 2013-12-11 ENCOUNTER — Emergency Department (HOSPITAL_COMMUNITY)
Admission: EM | Admit: 2013-12-11 | Discharge: 2013-12-12 | Disposition: A | Discharge status: Home / Self Care | Payer: Self-pay | Attending: Emergency Medicine | Admitting: Emergency Medicine

## 2013-12-11 DIAGNOSIS — Z9119 Patient's noncompliance with other medical treatment and regimen: Secondary | ICD-10-CM | POA: Insufficient documentation

## 2013-12-11 DIAGNOSIS — R45850 Homicidal ideations: Secondary | ICD-10-CM | POA: Insufficient documentation

## 2013-12-11 DIAGNOSIS — G4700 Insomnia, unspecified: Secondary | ICD-10-CM | POA: Insufficient documentation

## 2013-12-11 DIAGNOSIS — Z91199 Patient's noncompliance with other medical treatment and regimen due to unspecified reason: Secondary | ICD-10-CM | POA: Insufficient documentation

## 2013-12-11 DIAGNOSIS — E1149 Type 2 diabetes mellitus with other diabetic neurological complication: Secondary | ICD-10-CM | POA: Insufficient documentation

## 2013-12-11 DIAGNOSIS — E1142 Type 2 diabetes mellitus with diabetic polyneuropathy: Secondary | ICD-10-CM | POA: Insufficient documentation

## 2013-12-11 DIAGNOSIS — Z79899 Other long term (current) drug therapy: Secondary | ICD-10-CM | POA: Insufficient documentation

## 2013-12-11 DIAGNOSIS — I10 Essential (primary) hypertension: Secondary | ICD-10-CM | POA: Insufficient documentation

## 2013-12-11 DIAGNOSIS — F17200 Nicotine dependence, unspecified, uncomplicated: Secondary | ICD-10-CM | POA: Insufficient documentation

## 2013-12-11 DIAGNOSIS — R45851 Suicidal ideations: Secondary | ICD-10-CM | POA: Insufficient documentation

## 2013-12-11 LAB — COMPREHENSIVE METABOLIC PANEL
ALK PHOS: 101 U/L (ref 39–117)
ALT: 25 U/L (ref 0–53)
AST: 28 U/L (ref 0–37)
Albumin: 3.8 g/dL (ref 3.5–5.2)
Anion gap: 10 (ref 5–15)
BUN: 10 mg/dL (ref 6–23)
CALCIUM: 9.6 mg/dL (ref 8.4–10.5)
CO2: 29 meq/L (ref 19–32)
Chloride: 102 mEq/L (ref 96–112)
Creatinine, Ser: 0.95 mg/dL (ref 0.50–1.35)
GFR calc Af Amer: >90 mL/min (ref >90)
Glucose, Bld: 164 mg/dL — ABNORMAL HIGH (ref 70–99)
POTASSIUM: 3.7 meq/L (ref 3.7–5.3)
SODIUM: 141 meq/L (ref 137–147)
Total Bilirubin: 0.6 mg/dL (ref 0.3–1.2)
Total Protein: 8 g/dL (ref 6.0–8.3)

## 2013-12-11 LAB — CBC
HCT: 46.4 % (ref 39.0–52.0)
HEMOGLOBIN: 16.3 g/dL (ref 13.0–17.0)
MCH: 29.4 pg (ref 26.0–34.0)
MCHC: 35.1 g/dL (ref 30.0–36.0)
MCV: 83.8 fL (ref 78.0–100.0)
PLATELETS: 146 10*3/uL — AB (ref 150–400)
RBC: 5.54 MIL/uL (ref 4.22–5.81)
RDW: 13.2 % (ref 11.5–15.5)
WBC: 2.2 10*3/uL — AB (ref 4.0–10.5)

## 2013-12-11 LAB — ETHANOL: Alcohol, Ethyl (B): <11 mg/dL (ref 0–11)

## 2013-12-11 LAB — RAPID URINE DRUG SCREEN, HOSP PERFORMED
Amphetamines: NOT DETECTED
BENZODIAZEPINES: NOT DETECTED
Barbiturates: NOT DETECTED
Cocaine: NOT DETECTED
Opiates: NOT DETECTED
TETRAHYDROCANNABINOL: NOT DETECTED

## 2013-12-11 LAB — CBG MONITORING, ED
Glucose-Capillary: 145 mg/dL — ABNORMAL HIGH (ref 70–99)
Glucose-Capillary: 148 mg/dL — ABNORMAL HIGH (ref 70–99)

## 2013-12-11 LAB — ACETAMINOPHEN LEVEL

## 2013-12-11 LAB — SALICYLATE LEVEL

## 2013-12-11 MED ORDER — METFORMIN HCL 500 MG PO TABS
500.0000 mg | ORAL_TABLET | Freq: Two times a day (BID) | ORAL | Status: DC
  Administered 2013-12-11 – 2013-12-12 (×2): 500 mg via ORAL
  Filled 2013-12-11 (×4): qty 1

## 2013-12-11 MED ORDER — IBUPROFEN 200 MG PO TABS: 600.0000 mg | ORAL_TABLET | Freq: Three times a day (TID) | ORAL | Status: DC | PRN

## 2013-12-11 MED ORDER — ACETAMINOPHEN 325 MG PO TABS
650.0000 mg | ORAL_TABLET | ORAL | Status: DC | PRN
  Administered 2013-12-11: 650 mg via ORAL
  Filled 2013-12-11: qty 2

## 2013-12-11 MED ORDER — LISINOPRIL 2.5 MG PO TABS
2.5000 mg | ORAL_TABLET | Freq: Every day | ORAL | Status: DC
  Administered 2013-12-11: 2.5 mg via ORAL
  Administered 2013-12-12: 5 mg via ORAL
  Filled 2013-12-11 (×3): qty 1

## 2013-12-11 MED ORDER — INSULIN ASPART 100 UNIT/ML ~~LOC~~ SOLN
0.0000 [IU] | Freq: Three times a day (TID) | SUBCUTANEOUS | Status: DC
  Filled 2013-12-11: qty 1

## 2013-12-11 MED ORDER — LORAZEPAM 1 MG PO TABS
1.0000 mg | ORAL_TABLET | Freq: Three times a day (TID) | ORAL | Status: DC | PRN
  Administered 2013-12-11: 1 mg via ORAL
  Filled 2013-12-11: qty 1

## 2013-12-11 NOTE — ED Notes (Addendum)
Dr. Loretha StaplerWofford aware of patient's BS of 145, and his refusal to take insulin.

## 2013-12-11 NOTE — ED Notes (Signed)
Upon assessment, pt reports wife has a boyfriend. Pt reports boyfriend is living in his house with his wife and kids. Pt reports SI and HI toward boyfriend for months. Pt reports active plan of shooting self and boyfriend.

## 2013-12-11 NOTE — ED Notes (Signed)
Pt alert and oriented. +SI/HI (will not disclose who) will not verbally contract for safety. "I will try"; +A hall "voices talking to me and telling me to kill them"; -V hall. Denies pain or discomfort. Will continue to monitor and evaluate for stabilization.

## 2013-12-11 NOTE — ED Notes (Signed)
One bag of pt belongings [facility].

## 2013-12-11 NOTE — BH Assessment (Signed)
Rosey Batheresa Geographical information systems officernursing secretary at [facility] in TTS consult @[phone] .  Glorious PeachNajah Briannia Laba, MS, LCASA Assessment Counselor

## 2013-12-11 NOTE — ED Notes (Signed)
Iv team paged for blood and iv, per RN. Multiple attempts made.

## 2013-12-11 NOTE — ED Notes (Signed)
Pts belongings documented on belonging sheet and placed in locker 34

## 2013-12-11 NOTE — ED Notes (Addendum)
Patient has been wanded by security.  He has on blue scrubs and yellow socks.  He has one personal belonging bag. I put patient belongings behind the desk near room 10.

## 2013-12-11 NOTE — ED Provider Notes (Signed)
CSN: [id number]     Arrival date & time 12/11/13  0810 History   First MD Initiated Contact with Patient 12/11/13 0815     Chief Complaint  Patient presents with  . Suicidal  . Homicidal     (Consider location/radiation/quality/duration/timing/severity/associated sxs/prior Treatment) Patient is a 43 y.o. male presenting with mental health disorder.  Mental Health Problem Presenting symptoms: homicidal ideas and suicidal thoughts   Patient accompanied by:  Law enforcement Degree of incapacity (severity):  Severe Onset quality:  Gradual Duration: months, worse today. Timing:  Constant Progression:  Worsening Chronicity:  Recurrent Context: noncompliance and stressful life event   Treatment compliance:  Untreated Time since last psychoactive medication taken:  1 week Relieved by:  Nothing Worsened by:  Nothing tried Ineffective treatments:  Antidepressants and antipsychotics Associated symptoms: insomnia and poor judgment   Associated symptoms: no abdominal pain     Past Medical History  Diagnosis Date  . Diabetes mellitus   . Hypertension   . Diabetic neuropathy    Past Surgical History  Procedure Laterality Date  . Mouth surgery    . Tympanostomy tube placement     No family history on file. History  Substance Use Topics  . Smoking status: Current Every Day Smoker  . Smokeless tobacco: Not on file  . Alcohol Use: No    Review of Systems  Gastrointestinal: Negative for abdominal pain.  Psychiatric/Behavioral: Positive for suicidal ideas and homicidal ideas. The patient has insomnia.   All other systems reviewed and are negative.     Allergies  Review of patient's allergies indicates no known allergies.  Home Medications   Prior to Admission medications   Medication Sig Start Date End Date Taking? Authorizing Provider  divalproex (DEPAKOTE ER) 500 MG 24 hr tablet Take 2 tablets (1,000 mg total) by mouth at bedtime. For mood stabilization 11/15/13   Sanjuana KavaAgnes I  Nwoko, NP  escitalopram (LEXAPRO) 20 MG tablet Take 1 tablet (20 mg total) by mouth at bedtime. For depression 11/15/13   Sanjuana KavaAgnes I Nwoko, NP  lisinopril (PRINIVIL,ZESTRIL) 2.5 MG tablet Take 1 tablet (2.5 mg total) by mouth daily. For high blood pressure 11/15/13   Sanjuana KavaAgnes I Nwoko, NP  risperiDONE (RISPERDAL) 1 MG tablet Take 1 mg twice daily and at bedtime: For mood control 11/15/13   Sanjuana KavaAgnes I Nwoko, NP   BP 137/86  Pulse 90  Temp(Src) 98.3 F (36.8 C) (Oral)  Resp 16  SpO2 99% Physical Exam  Nursing note and vitals reviewed. Constitutional: He is oriented to person, place, and time. He appears well-developed and well-nourished. No distress.  HENT:  Head: Normocephalic and atraumatic.  Eyes: Conjunctivae are normal. No scleral icterus.  Neck: Neck supple.  Cardiovascular: Normal rate and intact distal pulses.   Pulmonary/Chest: Effort normal. No stridor. No respiratory distress.  Abdominal: Normal appearance. He exhibits no distension.  Neurological: He is alert and oriented to person, place, and time.  Skin: Skin is warm and dry. No rash noted.  Psychiatric: He has a normal mood and affect. His behavior is normal. He expresses homicidal and suicidal ideation.    ED Course  Procedures (including critical care time) Labs Review Labs Reviewed  CBC - Abnormal; Notable for the following:    WBC 2.2 (*)    Platelets 146 (*)    All other components within normal limits  COMPREHENSIVE METABOLIC PANEL - Abnormal; Notable for the following:    Glucose, Bld 164 (*)    All other components within normal  limits  SALICYLATE LEVEL - Abnormal; Notable for the following:    Salicylate Lvl <2.0 (*)    All other components within normal limits  CBG MONITORING, ED - Abnormal; Notable for the following:    Glucose-Capillary 145 (*)    All other components within normal limits  ACETAMINOPHEN LEVEL  ETHANOL  URINE RAPID DRUG SCREEN (HOSP PERFORMED)    Imaging Review No results found.   EKG  Interpretation None      MDM   Final diagnoses:  Suicidal ideations  Homicidal ideations    43 yo male with SI and HI.  States he wants kill his wife's boyfriend and then kill himself in a murder-suicide.  He is upset that his wife is living with another man.  He wants to hurt his wife too, but states he won't because their kids need her.  He has been seen for this same problem a few times in the past few months, including a hospitalization here a few weeks ago.    IVC papers submitted.  He shows no signs or symptoms of organic disease.  TTS consult requested.      Candyce ChurnJohn David Azuree Minish III, MD 12/11/13 1520

## 2013-12-11 NOTE — ED Notes (Signed)
[facility].  He does not want to take insulin injections, but agrees to CBG's and Metformin which he does at home.  He has been out of his room some and interacting with peers.
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
## 2013-12-11 NOTE — Progress Notes (Signed)
  CARE MANAGEMENT ED NOTE 12/11/2013  Patient:  Richard [name],Richard [name]   Account Number:  [id number]  Date Initiated:  12/11/2013  Documentation initiated by:  Radford PaxFERRERO,Mateusz Neilan  Subjective/Objective Assessment:   Patient presents to Ed with SI HI     Subjective/Objective Assessment Detail:     Action/Plan:   Action/Plan Detail:   Anticipated DC Date:       Status Recommendation to Physician:   Result of Recommendation:    Other ED Services  Consult Working Plan    DC Planning Services  Other  PCP issues    Choice offered to / List presented to:            Status of service:  Completed, signed off  ED Comments:   ED Comments Detail:  EDCM went to speak to patient regarding pcp issues. Patient listed as having no insurance and [place]. EDCM did not wake patient at this time. [facility] provided patient with list of pcps who accept self pay patients, list of discounted pharmacies and websites needymed.org and GoodRX.com for medication assistance, financial resources in the community such as local churches and salvation army, urban ministries, phone number to inquire about the prange card and DSS for [place], contact information for [facility], and dental assistance for patients without insurance.  EDCM left this informations at patient's bedside.  No further EDCM needs at this time.

## 2013-12-11 NOTE — ED Notes (Signed)
Patient reports he has a headache he is rating as a 3. He is requesting Tylenol.

## 2013-12-11 NOTE — ED Notes (Signed)
Pt is SI/HI due to family issues, pt states he doesn't know where to start as far as talking to someone.

## 2013-12-12 ENCOUNTER — Inpatient Hospital Stay (HOSPITAL_COMMUNITY)
Admission: AD | Admit: 2013-12-12 | Discharge: 2013-12-18 | DRG: 885 | Disposition: A | Discharge status: Home / Self Care | Payer: No Typology Code available for payment source | Source: Intra-hospital | Attending: Psychiatry | Admitting: Psychiatry

## 2013-12-12 ENCOUNTER — Encounter (HOSPITAL_COMMUNITY): Payer: Self-pay | Admitting: *Deleted

## 2013-12-12 DIAGNOSIS — F17200 Nicotine dependence, unspecified, uncomplicated: Secondary | ICD-10-CM | POA: Diagnosis present

## 2013-12-12 DIAGNOSIS — Z559 Problems related to education and literacy, unspecified: Secondary | ICD-10-CM

## 2013-12-12 DIAGNOSIS — R45850 Homicidal ideations: Secondary | ICD-10-CM

## 2013-12-12 DIAGNOSIS — Z5900 Homelessness unspecified: Secondary | ICD-10-CM

## 2013-12-12 DIAGNOSIS — F411 Generalized anxiety disorder: Secondary | ICD-10-CM | POA: Diagnosis present

## 2013-12-12 DIAGNOSIS — R51 Headache: Secondary | ICD-10-CM | POA: Diagnosis present

## 2013-12-12 DIAGNOSIS — R45851 Suicidal ideations: Secondary | ICD-10-CM | POA: Diagnosis not present

## 2013-12-12 DIAGNOSIS — E1142 Type 2 diabetes mellitus with diabetic polyneuropathy: Secondary | ICD-10-CM | POA: Diagnosis present

## 2013-12-12 DIAGNOSIS — E1149 Type 2 diabetes mellitus with other diabetic neurological complication: Secondary | ICD-10-CM | POA: Diagnosis present

## 2013-12-12 DIAGNOSIS — IMO0002 Reserved for concepts with insufficient information to code with codable children: Secondary | ICD-10-CM | POA: Diagnosis not present

## 2013-12-12 DIAGNOSIS — I10 Essential (primary) hypertension: Secondary | ICD-10-CM | POA: Diagnosis present

## 2013-12-12 DIAGNOSIS — G4700 Insomnia, unspecified: Secondary | ICD-10-CM | POA: Diagnosis present

## 2013-12-12 DIAGNOSIS — F333 Major depressive disorder, recurrent, severe with psychotic symptoms: Secondary | ICD-10-CM | POA: Diagnosis present

## 2013-12-12 DIAGNOSIS — Z609 Problem related to social environment, unspecified: Secondary | ICD-10-CM

## 2013-12-12 DIAGNOSIS — F332 Major depressive disorder, recurrent severe without psychotic features: Secondary | ICD-10-CM

## 2013-12-12 LAB — CBG MONITORING, ED
GLUCOSE-CAPILLARY: 117 mg/dL — AB (ref 70–99)
GLUCOSE-CAPILLARY: 82 mg/dL (ref 70–99)
Glucose-Capillary: 129 mg/dL — ABNORMAL HIGH (ref 70–99)

## 2013-12-12 LAB — GLUCOSE, CAPILLARY: GLUCOSE-CAPILLARY: 98 mg/dL (ref 70–99)

## 2013-12-12 MED ORDER — ACETAMINOPHEN 325 MG PO TABS
650.0000 mg | ORAL_TABLET | Freq: Four times a day (QID) | ORAL | Status: DC | PRN
  Administered 2013-12-13 – 2013-12-16 (×4): 650 mg via ORAL
  Filled 2013-12-12 (×2): qty 2

## 2013-12-12 MED ORDER — ALUM & MAG HYDROXIDE-SIMETH 200-200-20 MG/5ML PO SUSP: 30.0000 mL | ORAL | Status: DC | PRN

## 2013-12-12 MED ORDER — ACETAMINOPHEN 325 MG PO TABS
650.0000 mg | ORAL_TABLET | ORAL | Status: DC | PRN
Start: 2013-12-12 — End: 2013-12-18
  Filled 2013-12-12 (×2): qty 2

## 2013-12-12 MED ORDER — TRAZODONE HCL 50 MG PO TABS
50.0000 mg | ORAL_TABLET | Freq: Every evening | ORAL | Status: DC | PRN
  Administered 2013-12-12 – 2013-12-17 (×6): 50 mg via ORAL
  Filled 2013-12-12 (×5): qty 1
  Filled 2013-12-12: qty 14
  Filled 2013-12-12 (×8): qty 1
  Filled 2013-12-12: qty 14
  Filled 2013-12-12: qty 1

## 2013-12-12 MED ORDER — INSULIN ASPART 100 UNIT/ML ~~LOC~~ SOLN: 0.0000 [IU] | Freq: Three times a day (TID) | SUBCUTANEOUS | Status: DC

## 2013-12-12 MED ORDER — MAGNESIUM HYDROXIDE 400 MG/5ML PO SUSP: 30.0000 mL | Freq: Every day | ORAL | Status: DC | PRN

## 2013-12-12 MED ORDER — METFORMIN HCL 500 MG PO TABS
500.0000 mg | ORAL_TABLET | Freq: Two times a day (BID) | ORAL | Status: DC
  Administered 2013-12-12 – 2013-12-18 (×12): 500 mg via ORAL
  Filled 2013-12-12 (×9): qty 1
  Filled 2013-12-12: qty 14
  Filled 2013-12-12 (×2): qty 1
  Filled 2013-12-12: qty 14
  Filled 2013-12-12 (×4): qty 1

## 2013-12-12 MED ORDER — LORAZEPAM 1 MG PO TABS
1.0000 mg | ORAL_TABLET | Freq: Three times a day (TID) | ORAL | Status: DC | PRN
  Administered 2013-12-12 – 2013-12-13 (×2): 1 mg via ORAL
  Filled 2013-12-12 (×2): qty 1

## 2013-12-12 MED ORDER — LISINOPRIL 5 MG PO TABS
2.5000 mg | ORAL_TABLET | Freq: Every day | ORAL | Status: DC
  Administered 2013-12-13 – 2013-12-18 (×6): 2.5 mg via ORAL
  Filled 2013-12-12 (×8): qty 1

## 2013-12-12 NOTE — BH Assessment (Addendum)
[facility] Assessment Progress Note      [facility] per Ava Elisabeth MostStevenson.  Support paperwork complete. Pt is under IVC.  Will be transported by [employer]. ED staff notified.

## 2013-12-12 NOTE — Progress Notes (Signed)
Report given to Richard [name]. RN and patient would not report who he is HI towards because he does not want anyone telling them and Richard [name]. RN made aware; patient refused to answer some of the admission questions

## 2013-12-12 NOTE — Progress Notes (Signed)
[facility].

## 2013-12-12 NOTE — Tx Team (Signed)
Initial Interdisciplinary Treatment Plan  PATIENT STRENGTHS: (choose at least two) Average or above average intelligence Capable of independent living General fund of knowledge  PATIENT STRESSORS: Loss of significant relationship   PROBLEM LIST: Problem List/Patient Goals Date to be addressed Date deferred Reason deferred Estimated date of resolution                                                         DISCHARGE CRITERIA:  Improved stabilization in mood, thinking, and/or behavior Need for constant or close observation no longer present Reduction of life-threatening or endangering symptoms to within safe limits  PRELIMINARY DISCHARGE PLAN: Outpatient therapy Placement in alternative living arrangements  PATIENT/FAMIILY INVOLVEMENT: This treatment plan has been presented to and reviewed with the patient, Richard [name].  The patient and family have been given the opportunity to ask questions and make suggestions.  Richard [name], Richard [name] M 12/12/2013, 5:10 PM

## 2013-12-12 NOTE — Progress Notes (Signed)
D: Patient in the dayroom on first approach.  Patient would not talk to writer on first approach. Patient appeared irritable and angry.  Patient finally talked to Clinical research associatewriter about his medications.  Patient states he no longer wants take insulin because he states he does not take insulin.  Patient also stated he has had trouble sleeping.    Patient denies SI but states he has HI towards his wife and the person she is dating.  Patient states he is also having HI towards others but will not specify who.  Patient denies AVH. A: Staff to monitor Q 15 mins for safety.  Encouragement and support offered.  Scheduled medications administered per orders. R: Patient remains safe on the unit.  Patient attended group tonight.  Patient visible on the unit but not interacting with anyone. Patient taking administered medications.

## 2013-12-12 NOTE — ED Notes (Signed)
Pt. Alert and oriented, awake in room watching TV. Pt. Still expressing SI and HI toward wifes boyfriend.

## 2013-12-12 NOTE — Progress Notes (Signed)
Pt. Requested and given crackers, cheese, and sprite.
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
## 2013-12-12 NOTE — Progress Notes (Signed)
Patient went to dining room for dinner.   Verbal escalation between this patient and other patient.  Patient became upset, threw items, pushed wheelchair.  MHT's, charge nurse, etc talked to patient and brought him back to his room.  Patient given ativan 1 mg prn and staff continued to talk with patient.  Patient calm and in his room.   Will continue to talk with patient and monitor him for safety.  Safety maintained.

## 2013-12-12 NOTE — Progress Notes (Signed)
Adult Psychoeducational Group Note  Date:  12/12/2013 Time:  10:26 PM  Group Topic/Focus:  Wrap-Up Group:   The focus of this group is to help patients review their daily goal of treatment and discuss progress on daily workbooks.  Participation Level:  None  Participation Quality:  Inattentive and Resistant  Affect:  Blunted  Cognitive:  Lacking  Insight: None  Engagement in Group:  None  Modes of Intervention:  Discussion, Education, Socialization and Support  Additional Comments:  Pt attended group but did not participate or speak.    Berlin Hunuttle, Muriah Harsha M 12/12/2013, 10:26 PM

## 2013-12-12 NOTE — Progress Notes (Signed)
[facility] provided pt with a list of primary care resources, highlighting [facility], to help patient establish a pcp.

## 2013-12-12 NOTE — Consult Note (Signed)
  Review of Systems  Unable to perform ROS: acuity of condition    

## 2013-12-13 DIAGNOSIS — R45850 Homicidal ideations: Secondary | ICD-10-CM

## 2013-12-13 DIAGNOSIS — R45851 Suicidal ideations: Secondary | ICD-10-CM

## 2013-12-13 DIAGNOSIS — F329 Major depressive disorder, single episode, unspecified: Secondary | ICD-10-CM

## 2013-12-13 LAB — GLUCOSE, CAPILLARY: GLUCOSE-CAPILLARY: 125 mg/dL — AB (ref 70–99)

## 2013-12-13 MED ORDER — HALOPERIDOL 5 MG PO TABS: 5.0000 mg | ORAL_TABLET | Freq: Three times a day (TID) | ORAL | Status: DC | PRN

## 2013-12-13 MED ORDER — LORAZEPAM 1 MG PO TABS
1.0000 mg | ORAL_TABLET | Freq: Four times a day (QID) | ORAL | Status: DC | PRN
  Administered 2013-12-15 – 2013-12-16 (×2): 1 mg via ORAL
  Filled 2013-12-13 (×2): qty 1

## 2013-12-13 MED ORDER — DIVALPROEX SODIUM ER 500 MG PO TB24
500.0000 mg | ORAL_TABLET | Freq: Every day | ORAL | Status: DC
  Administered 2013-12-14 – 2013-12-15 (×2): 500 mg via ORAL
  Filled 2013-12-13 (×4): qty 1

## 2013-12-13 MED ORDER — OLANZAPINE 10 MG IM SOLR
10.0000 mg | Freq: Once | INTRAMUSCULAR | Status: DC
  Filled 2013-12-13: qty 10

## 2013-12-13 MED ORDER — OLANZAPINE 10 MG PO TBDP
10.0000 mg | ORAL_TABLET | Freq: Once | ORAL | Status: DC
  Filled 2013-12-13: qty 1

## 2013-12-13 MED ORDER — ESCITALOPRAM OXALATE 10 MG PO TABS
10.0000 mg | ORAL_TABLET | Freq: Every day | ORAL | Status: DC
  Administered 2013-12-14 – 2013-12-18 (×5): 10 mg via ORAL
  Filled 2013-12-13: qty 1
  Filled 2013-12-13: qty 7
  Filled 2013-12-13 (×6): qty 1

## 2013-12-13 MED ORDER — OLANZAPINE 10 MG PO TBDP
ORAL_TABLET | ORAL | Status: AC
  Administered 2013-12-13: 18:00:00
  Filled 2013-12-13: qty 1

## 2013-12-13 MED ORDER — OLANZAPINE 10 MG IM SOLR: 10.0000 mg | Freq: Once | INTRAMUSCULAR | Status: DC

## 2013-12-13 MED ORDER — RISPERIDONE 2 MG PO TABS
2.0000 mg | ORAL_TABLET | Freq: Every day | ORAL | Status: DC
  Filled 2013-12-13 (×6): qty 1

## 2013-12-13 NOTE — [facility] Suicide Risk Assessment (Signed)
   Nursing information obtained from:  Patient Demographic factors:  Male;Low socioeconomic status;Unemployed Current Mental Status:  Thoughts of violence towards others Loss Factors:  Loss of significant relationship Historical Factors:  NA Risk Reduction Factors:  Responsible for children under 41 years of age;Sense of responsibility to family Total Time spent with patient: 45 minutes  CLINICAL FACTORS:   Severe Anxiety and/or Agitation Depression:   Aggression  Psychiatric Specialty Exam: Physical Exam  ROS  Blood pressure 117/72, pulse 79, temperature 98.4 F (36.9 C), temperature source Oral, resp. rate 16, height 5' 7.5" (1.715 m), weight 97.523 kg (215 lb).Body mass index is 33.16 kg/(m^2).  General Appearance: Neat  Eye Contact::  Good  Speech:  Normal Rate  Volume:  Normal  Mood:  Depressed and Irritable  Affect:  Congruent and Labile  Thought Process:  Linear  Orientation:  Full (Time, Place, and Person)  Thought Content:  Rumination- he is ruminative about  His wife separating from him and now living with someone else, and about the anger he feels about this  Suicidal Thoughts:  Yes.  with intent/plan- he states that when he is discharged from the hospital he is thinking of  Hurting the man his wife is now living with and " maybe he'll kill me or maybe we'll both die"  Homicidal Thoughts:  Yes.  with intent/plan- as above   Memory:  Negative  Judgement:  Impaired  Insight:  Fair  Psychomotor Activity:  Normal  Concentration:  Good  Recall:  Good  Fund of Knowledge:Good  Language: Good  Akathisia:  Negative  Handed:  Right  AIMS (if indicated):     Assets:  Communication Skills Resilience  Sleep:  Number of Hours: 6.25  Note patient is denying any current suicidal ideations in the hospital. He is stating he has plan or getting into a confrontation with the man His wife is currently living with once he is discharged, and thinks he or the man or both will die from  this confrontation. Musculoskeletal: Strength & Muscle Tone: within normal limits Gait & Station: normal Patient leans: N/A  COGNITIVE FEATURES THAT CONTRIBUTE TO RISK:  Closed-mindedness Polarized thinking    SUICIDE RISK:   Severe:  Frequent, intense, and enduring suicidal ideation, specific plan, no subjective intent, but some objective markers of intent (i.e., choice of lethal method), the method is accessible, some limited preparatory behavior, evidence of impaired self-control, severe dysphoria/symptomatology, multiple risk factors present, and few if any protective factors, particularly a lack of social support.  PLAN OF CARE:Patient will be admitted to inpatient psychiatric unit for stabilization and safety. Will provide and encourage milieu participation. Provide medication management and maked adjustments as needed.  Will follow daily.    I certify that inpatient services furnished can reasonably be expected to improve the patient's condition.  COBOS, FERNANDO 12/13/2013, 11:30 AM

## 2013-12-13 NOTE — Progress Notes (Signed)
Did not attend group 

## 2013-12-13 NOTE — Progress Notes (Signed)
Patient ID: Richard [name], male   DOB: 1971-05-26, 43 y.o.   MRN: [id number]  Update: Patient continues to remain agitated and ruminates on "popping off" on someone. Patient reports that he wants to "start something" and keeps making darting eye contact at male staff and other patients. Patient was advised to stay back on the unit for recreation due to increased agitation and because he was going to speak with MD. Patient was reluctant but stayed back on unit. Patient was given PRN Ativan to help. MD, charge RN, and staff members working with this patient were informed of patient's agitation.

## 2013-12-13 NOTE — [facility] Group Notes (Signed)
[facility]   12/13/2013  8:45 AM  Participation Quality:  Did Not Attend - pt sleeping in his room  Richard IvanChelsea Horton, LCSW 12/13/2013 10:15 AM
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
## 2013-12-13 NOTE — BHH Counselor (Signed)
Adult Psychosocial Assessment Update Interdisciplinary Team  [facility] admissions/discharges:  Admissions Discharges  Date: 11/09/13 Date: 11/15/13  Date: Date:  Date: Date:  Date: Date:  Date: Date:   Changes since the last Psychosocial Assessment (including adherence to outpatient mental health and/or substance abuse treatment, situational issues contributing to decompensation and/or relapse). Pt present irritable today, stating that he has been telling everyone that he doesn't want to be here.  Pt states that since last admission he continues to be stressed out about his marriage.  Pt explained that his wife of 17 years is in another relationship and is now pregnant.  Pt continuously states that he is having thoughts of hurting someone but we can't help him with it and wants to go to another facility.  Pt refused follow up and any collateral contact.  CSW will continue to assess pt as he becomes more stable and willing to work with staff.               Discharge Plan 1. Will you be returning to the same living situation after discharge?   Yes: X Pt can return home No:      If no, what is your plan?           2. Would you like a referral for services when you are discharged? Yes:      If yes, for what services?  No:   X Pt refused any follow up.           Summary and Recommendations (to be completed by the evaluator) Patient is a 43 year old African American Male with a diagnosis of Major Depressive, Recurrent Severe without Psychotic Features.  [place]  Patient will benefit from crisis stabilization, medication evaluation, group therapy and psycho education in addition to case management for discharge planning.                         Signature:  Carmina MillerHorton, Amelianna Meller Nicole, 12/13/2013 8:13 AM

## 2013-12-13 NOTE — Progress Notes (Signed)
Patient ID: Richard [name], male   DOB: 1970/11/26, 43 y.o.   MRN: [id number]  D: Patient presents to the medication with angry mood. Patient refused to answer if he was suicidal or homicidal today. Patient states, "just know I'm in a bad mood." Patient reports that he does not want to answer these questions because he does not want to be followed around. MHT, charge RN, and MD were notified of patient's statements and refusal to answer this writers questions. Patient did state that he is not having visual hallucinations but he is "seeing things." Patient refuses to elaborate. Patient refuses to fill out daily inventory sheet. Patient reported a headache and received PRN Tylenol for this. Upon reassessment patient reported a decrease in pain.   A: Support and encouragement provided to the patient to talk to writer if patient needed anything or wanted to talk. Patient verbalized understanding. Scheduled medications administered to patient per physician's orders.  R: Patient is minimal and guarded with this Clinical research associatewriter. Patient remains angry but does not want to speak with staff. Q15 minute checks are maintained for safety.

## 2013-12-13 NOTE — [facility] Group Notes (Signed)
[facility] LCSW Group Therapy  Emotional Regulation 1:15 - 2: 30 PM        12/13/2013  3:02 PM    Type of Therapy:  Group Therapy  Participation Level:  Patient did not attend group.   Wynn BankerHodnett, Kayla Weekes Hairston 12/13/2013 3:02 PM

## 2013-12-13 NOTE — Progress Notes (Signed)
D: Patient in bed tonight.  Patient sis not appear on the unit.  Patient irritable but cooperative with Clinical research associatewriter.  Patient states h ehad a rough day because he could not go to the cafeteria for dinner.  Patient states he would like to go to the cafeteria tomorrow and per order he will be allowed to try to go to breakfast in the am.  Patient states his anger stems from thoughts of his wife and her boyfriend.  Patient opened up with writer talking about his stressors.  Patient states he has been married 17 years and has been separated from his wife for 1 year.  Patient states he now currently has HI towards her boyfriend but states he no longer wants to kill his wife because he would not want to do that to his children.  Patient denies SI when asked by writer directly but then states if her were to kill his wife's boyfriend he would kill himself rather than going to jail.  Patient verbally contracts for safety.  Patient states he hears voices telling him to kill.   A: Staff to monitor Q 15 mins for safety.  Encouragement and support offered.  Scheduled medications not administered, patient refused.   R: Patient remains safe on the unit.  Patient did not attend group tonight.  Patient not visible on the unit tonight.  Patient refused bedtime medications.

## 2013-12-13 NOTE — [facility] Suicide Risk Assessment (Signed)
[facility] Family/Significant Other Suicide Prevention Education  Suicide Prevention Education:   Patient Refusal for Family/Significant Other Suicide Prevention Education: The patient has refused to provide written consent for family/significant other to be provided Family/Significant Other Suicide Prevention Education during admission and/or prior to discharge.  Physician notified.  CSW provided suicide prevention information with patient.    The suicide prevention education provided includes the following:  Suicide risk factors  Suicide prevention and interventions  National Suicide Hotline telephone number  [facility] assessment telephone number  [facility] Emergency Assistance 911  [facility] and/or Residential Mobile Crisis Unit telephone number   Reyes IvanChelsea Horton, [place] 12/13/2013 10:49 AM

## 2013-12-14 LAB — GLUCOSE, CAPILLARY: Glucose-Capillary: 117 mg/dL — ABNORMAL HIGH (ref 70–99)

## 2013-12-14 NOTE — [facility] Group Notes (Signed)
[facility] LCSW Group Therapy  12/14/2013   1:15 PM   Type of Therapy:  Group Therapy  Participation Level:  Minimal  Participation Quality:  Minimal, Drowsy - sleeping through group  Affect:  Irritable  Cognitive:  Drowsy  Insight:  Limited, Lacking  Engagement in Therapy:  Limited, Lacking  Modes of Intervention:  Activity, Clarification, Confrontation, Discussion, Education, Exploration, Limit-setting, Orientation, Problem-solving, Rapport Building, Dance movement psychotherapisteality Testing, Socialization and Support  Summary of Progress/Problems: Patient was attentive and engaged with speaker from [employer].  Patient was attentive to speaker while they shared their story of dealing with mental health and overcoming it.  Patient expressed interest in their programs and services and received information on their agency.  Patient processed ways they can relate to the speaker.     Kemiah Booz Horton, LCSW 12/14/2013  1:40 PM

## 2013-12-14 NOTE — Progress Notes (Signed)
Recreation Therapy Notes  Animal-Assisted Activity/Therapy (AAA/T) Program Checklist/Progress Notes Patient Eligibility Criteria Checklist & Daily Group note for Rec Tx Intervention  Date: 07.09.2015 Time: 3:15pm Location: [address] Dayroom   AAA/T Program Assumption of Risk Form signed by Patient/ or Parent Legal Guardian yes  Patient is free of allergies or sever asthma yes  Patient reports no fear of animals yes  Patient reports no history of cruelty to animals yes   Patient understands his/her participation is voluntary yes  Patient washes hands before animal contact yes  Patient washes hands after animal contact yes  Behavioral Response: Engaged, Appropriate   Education: Hand Washing, Appropriate Animal Interaction   Education Outcome: Acknowledges understanding   Clinical Observations/Feedback: Patient interacted appropriately with therapy dog and peers in session.    Richard [name] L Richard [name], Richard [name]        Richard [name] L 12/14/2013 4:11 PM 

## 2013-12-14 NOTE — Progress Notes (Signed)
D:  Patient's self inventory sheet, patient has fair sleep, improving appetite, low energy level, poor attention span.  Rated depression 4, hopeless 3, anxiety 5.  Denied withdrawals.  SI off/on, no plan, contracts for safety.  [facility]  Needs financial assistance with medications after discharge. A:  Medications administered per MD orders.  Emotional support and encouragement given patient. R:  SI, no plan, contracts for safety.   HI to guy who is with his wife.  Denied visual hallucinations.  Voices are telling him to kill the guy who took his wife away.  Safety maintained with 15 minute checks.

## 2013-12-14 NOTE — Progress Notes (Signed)
The focus of this group is to educate the patient on the purpose and policies of crisis stabilization and provide a format to answer questions about their admission.  The group details unit policies and expectations of patients while admitted.  Patient did not attend 0900 nurse education orientation group this morning.  Patient stayed in bed sleeping.    
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
## 2013-12-14 NOTE — Progress Notes (Signed)
[facility]  12/14/2013 3:33 PM Richard [name]  MRN:  [id number] Subjective:   " I'm OK today"  Objective : Patient improved compared to yesterday's presentation. Yesterday evening was irritable, oppositional, and refused to walk away from unit door or get out of line for kitchen ( there was concern as patient had been threatening to another person in this area the day before). Due to this inability to follow redirections police were contacted by Nursing. Patient remained irritable but did not physically act out, and eventually responded to calm reassurance/ redirection. He received a Zyprexa Zidis PRN and this helped further. Since then ( yesterday evening ) there have been no further incidents on unit and he has been calm. He has been able to go to day room and kitchen area with other patients with no acting out or any threatening or inappropriate behavior. Today he is calm, pleasant, and instead of anger, he is able to discuss his feelings of loss related to his wife leaving him. He states he is no longer thinking of hurting the man she is currently with, but states that " I'll have to defend myself if he comes looking to hurt me". At this time denies any intention of hurting this person or starting any confrontation. Regarding suicidal ideations, he states he is no longer thinking of hurting himself , and describes his desire to see his daughters grow up as a protective factor. Of note, based on yesterday's events and presentation, I have discussed case with Unit Administrator, Hospital Administration and Medical Director ( Dr. Lucianne MussKumar)  It should be noted that marital stressors as described are not new, and patient states that she left him about a year or more ago. He has had several prior psychiatric admissions with similar complaints. Chelsea, SW  Has worked towards contacting person involved to fulfill duty to warn, but it has been challenging as patient has not provided name or location for  this person. She has also contacted police to report the above.  Risk management has also been contacted. Patient has started taking his medications and denies side effects thus far. He had been on this medication regimen on prior discharge from our unit and had improved. 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  Respiratory: Negative for cough and shortness of breath.   Cardiovascular: Negative for chest pain.  Gastrointestinal: Negative for vomiting.  Psychiatric/Behavioral: Positive for depression.    Blood pressure 102/60, pulse 72, temperature 98.5 F (36.9 C), temperature source Oral, resp. rate 18, height 5' 7.5" (1.715 m), weight 97.523 kg (215 lb).Body mass index is 33.16 kg/(m^2).  General Appearance: Well Groomed  Patent attorneyye Contact::  Good  Speech:  Normal Rate  Volume:  Normal  Mood:  improved, better able to describe sense of depression related to loss, less angry, less irritable  Affect:  Appropriate and Congruent  Thought Process:  Goal Directed and Linear  Orientation:  Full (Time, Place, and Person)  Thought Content:  at this time denies hallucinations , no delusions  Suicidal Thoughts:  No- see above   Homicidal Thoughts:  No- see  above   Memory:  NA  Judgement:  Fair  Insight:  Fair  Psychomotor Activity:  Normal  Concentration:  Good  Recall:  Good  Fund of Knowledge:Good  Language: Good  Akathisia:  Negative  Handed:  Right  AIMS (if indicated):     Assets:  Communication Skills Desire for Improvement Resilience  Sleep:  Number of Hours: 6.25   Musculoskeletal: Strength &  Muscle Tone: within normal limits Gait & Station: normal Patient leans: N/A  Current Medications: Current Facility-Administered Medications  Medication Dose Route Frequency Provider Last Rate Last Dose  . acetaminophen (TYLENOL) tablet 650 mg  650 mg Oral Q4H PRN Benjaman Pott, MD      . acetaminophen (TYLENOL) tablet 650 mg  650 mg Oral Q6H PRN Benjaman Pott, MD   650 mg at 12/14/13 0830  . alum & mag hydroxide-simeth (MAALOX/MYLANTA) 200-200-20 MG/5ML suspension 30 mL  30 mL Oral Q4H PRN Benjaman Pott, MD      . divalproex (DEPAKOTE ER) 24 hr tablet 500 mg  500 mg Oral Daily Nehemiah Massed, MD   500 mg at 12/14/13 0824  . escitalopram (LEXAPRO) tablet 10 mg  10 mg Oral Daily Nehemiah Massed, MD   10 mg at 12/14/13 5784  . haloperidol (HALDOL) tablet 5 mg  5 mg Oral Q8H PRN Nehemiah Massed, MD      . lisinopril (PRINIVIL,ZESTRIL) tablet 2.5 mg  2.5 mg Oral Daily Benjaman Pott, MD   2.5 mg at 12/14/13 0824  . LORazepam (ATIVAN) tablet 1 mg  1 mg Oral Q6H PRN Nehemiah Massed, MD      . magnesium hydroxide (MILK OF MAGNESIA) suspension 30 mL  30 mL Oral Daily PRN Benjaman Pott, MD      . metFORMIN (GLUCOPHAGE) tablet 500 mg  500 mg Oral BID WC Benjaman Pott, MD   500 mg at 12/14/13 0825  . OLANZapine (ZYPREXA) injection 10 mg  10 mg Intramuscular Once Beau Fanny, FNP       Or  . OLANZapine zydis (ZYPREXA) disintegrating tablet 10 mg  10 mg Oral Once Beau Fanny, FNP      . risperiDONE (RISPERDAL) tablet 2 mg  2 mg Oral QHS Nehemiah Massed, MD      . traZODone (DESYREL) tablet 50 mg  50 mg Oral QHS,MR X 1 Kerry Hough, PA-C   50 mg at 12/12/13 2154    Lab Results:  Results for orders placed during the hospital encounter of 12/12/13 (from the past 48 hour(s))  GLUCOSE, CAPILLARY     Status: None   Collection Time    12/12/13  5:05 PM      Result Value Ref Range   Glucose-Capillary 98  70 - 99 mg/dL   Comment 1 Notify RN    GLUCOSE, CAPILLARY     Status: Abnormal    Collection Time    12/13/13  6:24 AM      Result Value Ref Range   Glucose-Capillary 125 (*) 70 - 99 mg/dL  GLUCOSE, CAPILLARY     Status: Abnormal   Collection Time    12/14/13  6:02 AM      Result Value Ref Range   Glucose-Capillary 117 (*) 70 - 99 mg/dL    Physical Findings: AIMS: Facial and Oral Movements Muscles of Facial Expression: None, normal Lips and Perioral Area: None, normal Jaw: None, normal Tongue: None, normal,Extremity Movements Upper (arms, wrists, hands, fingers): None, normal Lower (legs, knees,  ankles, toes): None, normal, Trunk Movements Neck, shoulders, hips: None, normal, Overall Severity Severity of abnormal movements (highest score from questions above): None, normal Incapacitation due to abnormal movements: None, normal Patient's awareness of abnormal movements (rate only patient's report): No Awareness, Dental Status Current problems with teeth and/or dentures?: No Does patient usually wear dentures?: No  CIWA:  CIWA-Ar Total: 1 COWS:  COWS Total Score: 1  Assessment:   At this time patient is improved compared to yesterday. He is much less irritable, less angry, less oppositional, and has made no threats nor been agitated today. He is more communicative, better able to describe feelings rather than simply exhibit anger, and has been medication compliant. He is denying suicidal or homicidal ideations today, and stating that he has no current plan or intention of seeking this man out or hurting him and would only resort to violence if attacked first. (He has  denied any thoughts of hurting his wife . ) He is tolerating medications well thus far. Case has been reviewed with Administration and Economist. Risk Management has been contacted. As advised, police have been contacted .  [facility] has been contacted. SW has attempted to contact person involved, but has not been able to as no current name available.   Treatment Plan Summary: Daily  contact with patient to assess and evaluate symptoms and progress in treatment Medication management As below  Plan: Continue treatment with Risperidone , Lexapro, Depakote. Ativan Bing Matter PRNS for severe agitation if needed. Continue to provide safe setting milieu. Continue working on disposition options.  Medical Decision Making Problem Points:  Established problem, stable/improving (1), Review of last therapy session (1) and Review of psycho-social stressors (1) Data Points:  Review or order clinical lab tests (1) Review and summation of old records (2) Review of medication regiment & side effects (2)  I certify that inpatient services furnished can reasonably be expected to improve the patient's condition.   COBOS, FERNANDO 12/14/2013, 3:33 PM

## 2013-12-15 MED ORDER — DIVALPROEX SODIUM ER 500 MG PO TB24
500.0000 mg | ORAL_TABLET | Freq: Two times a day (BID) | ORAL | Status: DC
  Administered 2013-12-15 – 2013-12-18 (×6): 500 mg via ORAL
  Filled 2013-12-15 (×3): qty 1
  Filled 2013-12-15: qty 14
  Filled 2013-12-15 (×4): qty 1
  Filled 2013-12-15: qty 14
  Filled 2013-12-15: qty 1

## 2013-12-15 NOTE — Clinical Social Work Note (Addendum)
CSW and Dr. Parke Poisson met with pt this morning.  Pt continues to denies SI/HI and says that he won't act on any homicidal thoughts.  Pt continues to refuse giving the name for his wife or the man he wanted to hurt. Pt continues to refuse follow up or collateral contact.  CSW notes pt appears more calm and able to carry a logical conversation today.    CSW and Dr. Parke Poisson with Amy in risk management at this time.  Dr. Parke Poisson presented to situation to Amy.  Amy states that pt has only been in the hospital 2 days and would agree that he should stay in the hospital.  Amy also states that it is okay to contact wife in a situation like this, to warn her and for her to warn her boyfriend prior to d/c.    CSW and Dr. Parke Poisson attempted to contact pt's wife as listed in epic, [place] Dietz/[place] [phone], [phone]).  Both numbers are disconnected.    CSW spoke with the police dept with the updated information above.  They plan to send an officer out asap to talk with pt.  Regan Lemming, LCSW 12/15/2013  10:03 AM

## 2013-12-15 NOTE — Progress Notes (Signed)
D: Pt is blunted in affect and depressed in mood. Pt was guarded for the majority of our interaction. Pt verbalizes that he is suppose to go home tomorrow. Pt states that he is just ready to go home. He denied any concerns he wished for this writer to address at this. He denied any SI/HI. However, Clinical research associatewriter still initiated a Engineer, manufacturingverbal contract for safety. Pt verbally agreed. [place] Pt voluntarily left for karaoke. Pt stayed for karaoke for the majority of the time. Pt was isolative to his room, while on the actual unit. Pt had very minimal interaction with others. Pt refused his risperdal due to the commercial about gynecomastia.  A: Writer administered scheduled and prn medications to pt. Continued support and avialabity as needed was extended to this pt. Staff continue to monitor pt with q7515min checks.  R: No adverse drug reactions noted. Pt receptive to treatment. Pt remains safe at this time.

## 2013-12-15 NOTE — Progress Notes (Addendum)
Patient ID: Richard [name], male   DOB: 1970-09-01, 43 y.o.   MRN: [id number] [facility] MD Progress Note  12/15/2013 12:13 PM Richard [name]  MRN:  [id number] Subjective:   " Not happy" Objective : I have discussed case with treatment team and met with patient. I have also worked with SW [facility]) regarding duty to warn issues. Patient's behavior has been in good control and  There have been no further episodes of overt anger or any threatening behaviors. Regarding initial suicidal /homicidal threats/statements he stated upon admission, he now states " I am not going to hurt anybody, I was just angry and venting my anger". States " If I were really going to hurt that dude, I would have done it already".  He does, however, continue to ruminate about his marital discord/ separation and states " some people can move on from things, I can't, I think about it all the time".  Chelsea ( SW) made attempts to contact person patient had threatened ( wife's boyfriend) - of note, patient has declined to provide name, address or phone. Stated " I don't know the dude". Efforts to reach wife, which was felt to be justified in order to fullfil duty to warn, were unsuccessful as all available phones were either wrong or disconnected. Vikki Ports has contacted the police and [place]. In meeting with patient along with Nursing staff, patient does gradually become better able to state " I am just very hurt by this" and states he recognizes that through the repeated admissions to psychiatric hospitals over recent weeks/ months he has been " asking for help". At this time he is agreeing to ongoing admission / medication management ( of note, patient is admitted under involuntary commitment). He denies side effects.  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  Respiratory: Negative for cough and shortness of breath.   Cardiovascular: Negative for chest pain.  Gastrointestinal: Negative for vomiting.  Psychiatric/Behavioral: Positive for depression.    Blood pressure 102/60, pulse 72, temperature 98.5 F (36.9 C), temperature source Oral, resp. rate 18, height 5' 7.5" (1.715 m), weight 97.523 kg (215 lb).Body mass index is 33.16 kg/(m^2).  General Appearance: Well Groomed  Engineer, water::  Good  Speech:  Normal Rate  Volume:  Normal  Mood:  Depressed, less irritable  Affect:  Appropriate and Congruent  Thought Process:  Goal Directed and Linear  Orientation:  Full (Time, Place, and Person)  Thought Content:  at this time denies hallucinations , no delusions  Suicidal Thoughts:  No- see above   Homicidal Thoughts:  No- see above   Memory:  NA  Judgement:  Fair  Insight:  Fair  Psychomotor Activity:  Normal  Concentration:  Good  Recall:  Good  Fund of Knowledge:Good  Language: Good  Akathisia:  Negative  Handed:  Right  AIMS (if indicated):     Assets:  Communication Skills Desire for Improvement Resilience  Sleep:  Number of Hours: 6.75   Musculoskeletal: Strength & Muscle Tone: within normal limits Gait & Station: normal Patient leans: N/A  Current Medications: Current Facility-Administered Medications  Medication Dose  Route Frequency Provider Last Rate Last Dose  . acetaminophen (TYLENOL) tablet 650 mg  650 mg Oral Q4H PRN Clarene Reamer, MD      . acetaminophen (TYLENOL) tablet 650 mg  650 mg Oral Q6H PRN Clarene Reamer, MD   650 mg at 12/14/13 0830  . alum & mag hydroxide-simeth (MAALOX/MYLANTA) 200-200-20 MG/5ML suspension 30 mL  30 mL Oral Q4H PRN Clarene Reamer, MD      . divalproex (DEPAKOTE ER) 24 hr  tablet 500 mg  500 mg Oral Daily Neita Garnet, MD   500 mg at 12/15/13 1761  . escitalopram (LEXAPRO) tablet 10 mg  10 mg Oral Daily Neita Garnet, MD   10 mg at 12/15/13 6073  . haloperidol (HALDOL) tablet 5 mg  5 mg Oral Q8H PRN Neita Garnet, MD      . lisinopril (PRINIVIL,ZESTRIL) tablet 2.5 mg  2.5 mg Oral Daily Clarene Reamer, MD   2.5 mg at 12/15/13 0814  . LORazepam (ATIVAN) tablet 1 mg  1 mg Oral Q6H PRN Neita Garnet, MD      . magnesium hydroxide (MILK OF MAGNESIA) suspension 30 mL  30 mL Oral Daily PRN Clarene Reamer, MD      . metFORMIN (GLUCOPHAGE) tablet 500 mg  500 mg Oral BID WC Clarene Reamer, MD   500 mg at 12/15/13 0814  . OLANZapine (ZYPREXA) injection 10 mg  10 mg Intramuscular Once Benjamine Mola, FNP       Or  . OLANZapine zydis (ZYPREXA) disintegrating tablet 10 mg  10 mg Oral Once Benjamine Mola, FNP      . risperiDONE (RISPERDAL) tablet 2 mg  2 mg Oral QHS Neita Garnet, MD      . traZODone (DESYREL) tablet 50 mg  50 mg Oral QHS,MR X 1 Laverle Hobby, PA-C   50 mg at 12/14/13 2138    Lab Results:  Results for orders placed during the hospital encounter of 12/12/13 (from the past 48 hour(s))  GLUCOSE, CAPILLARY     Status: Abnormal   Collection Time    12/14/13  6:02 AM      Result Value Ref Range   Glucose-Capillary 117 (*) 70 - 99 mg/dL    Physical Findings: AIMS: Facial and Oral Movements Muscles of Facial Expression: None, normal Lips and Perioral Area: None, normal Jaw: None, normal Tongue: None, normal,Extremity Movements Upper (arms, wrists, hands, fingers): None, normal Lower (legs, knees, ankles, toes): None, normal, Trunk Movements Neck, shoulders, hips: None, normal, Overall Severity Severity of abnormal movements (highest score from questions above): None, normal Incapacitation due to abnormal movements: None, normal Patient's awareness of abnormal movements (rate only patient's report): No Awareness, Dental Status Current problems  with teeth and/or dentures?: No Does patient usually wear dentures?: No  CIWA:  CIWA-Ar Total: 1 COWS:  COWS Total Score: 1  Assessment:  As noted above, patient is currently denying any suicidal or homicidal ideations, and has tended to initially minimize the seriousness of homicidal /suicidal statements he made upon admission. Upon further conversation he has been able to discuss his depression regarding loss of marital relationship. Although denying ongoing Stollings, he has refused to provide name /contact for the person he had expressed threats towards, and he is still ruminative. He is also presenting more depressed as his initial angry, irritable presentation subsides. As such, I feel that further inpatient support, treatment and monitoring  Is warranted, to help patient improve further and to  assess patient's ongoing stability, presentation .    Treatment Plan Summary: Daily contact with patient to assess and evaluate symptoms and progress in treatment Medication management As below  Plan: Continue treatment with Risperidone , Lexapro, Depakote. Increase Depakote ER to 500 mgrs BID Ativan /Haldol PRNS for severe agitation if needed. Continue to provide safe setting milieu.   Medical Decision Making Problem Points:  Established problem, stable/improving (1), Review of last therapy session (1) and Review of psycho-social stressors (1) Data Points:  Review and summation of old records (2) Review of medication regiment & side effects (2)  I certify that inpatient services furnished can reasonably be expected to improve the patient's condition.   Joshalyn Ancheta, [place] 12/15/2013, 12:13 PM

## 2013-12-15 NOTE — Progress Notes (Signed)
[facility] Group Notes:  (Nursing/MHT/Case Management/Adjunct)  Date:  12/15/2013  Time:  8:59 PM  Type of Therapy:  Psychoeducational Skills  Participation Level:  Minimal  Participation Quality:  Attentive  Affect:  Irritable  Cognitive:  Appropriate  Insight:  Limited  Engagement in Group:  Engaged  Modes of Intervention:  Education  Summary of Progress/Problems: The  Patient described his day as having been "all right". He was vague and guarded about his day and offered few details. He did mention that their was a problem earlier in the day with the police. He states that he was not pleased with his discharge being cancelled, but is looking forward to leaving the hospital on Monday. As a theme for the day, his support system will be comprised of his friend ("homeboy").   Cai Flott S 12/15/2013, 8:59 PM

## 2013-12-15 NOTE — Progress Notes (Signed)
D) the police came up to see Pt concerning the fact that they have a duty to warn. Pt was called from his room and then the police left the building without talking to the Pt. The police said that they could not speak with the Pt due to the fact they did not have a name to go by. Pt stood in the hall stating that he is just really angry with the his wife's boyfriend. "I really don't even know him" States he is just very angry and when he is angry he talks a lot and says a lot of things that he really isn't going to do. Feels that he has no place to stay and has been going from hospital to hospital.Did not fill out the Pt inventory A) Provided with a 1:1. Given support, reassurance and praise. Confronted that he is not communicating with his team over his care and who to contact while he is in the hospital.R) Pt remains upset that he is not going home. Affect and mood are sad and flat.

## 2013-12-15 NOTE — [facility] Group Notes (Signed)
[facility]   12/15/2013  8:45 AM  Participation Quality:  Did Not Attend - pt sleeping in his room  Richard IvanChelsea Horton, LCSW 12/15/2013 9:25 AM

## 2013-12-15 NOTE — Progress Notes (Signed)
Refused CBG " I'm just ready to go home".

## 2013-12-15 NOTE — [facility] Group Notes (Signed)
[facility] LCSW Group Therapy  12/15/2013 1:15 PM   Type of Therapy:  Group Therapy  Participation Level:  Did Not Attend - pt sleeping in his room  Richard IvanChelsea Horton, LCSW 12/15/2013 2:47 PM

## 2013-12-15 NOTE — Tx Team (Signed)
Interdisciplinary Treatment Plan Update (Adult)  Date: 12/15/2013  Time Reviewed:  9:45 AM  Progress in Treatment: Attending groups: Yes Participating in groups:  Yes Taking medication as prescribed:  Yes Tolerating medication:  Yes Family/Significant othe contact made: CSW assessing  Patient understands diagnosis:  Yes Discussing patient identified problems/goals with staff:  Yes Medical problems stabilized or resolved:  Yes Denies suicidal/homicidal ideation: Yes Issues/concerns per patient self-inventory:  Yes Other:  New problem(s) identified: CSW and MD have spoken to risk management, police dept and [facility] in regards to this case.  CSW will attempt to warn pt's wife/wife's boyfriend today, per risk management and MD.  Discharge Plan or Barriers: CSW assessing for appropriate referrals.  Currently pt refuses any follow up   Reason for Continuation of Hospitalization: Anxiety Depression Medication Stabilization Homicidal ideation  Comments: N/A  Estimated length of stay: 3-4 days  For review of initial/current patient goals, please see plan of care.  Attendees: Patient:     Family:     Physician:  Dr. Jama Flavorsobos 12/15/2013 10:16 AM   Nursing:  Lamount Crankerhris Judge, RN 12/15/2013 10:16 AM   Clinical Social Worker:  Reyes Ivanhelsea Horton, LCSW 12/15/2013 10:16 AM   Other: Leisa LenzValerie Enoch, [facility] 12/15/2013 10:16 AM   Other:  Harold Barbanonecia Byrd, RN 12/15/2013 10:16 AM   Other:   12/15/2013 10:16 AM   Other:     Other:    Other:    Other:    Other:    Other:      Scribe for Treatment Team:   Carmina MillerHorton, Alvis Edgell Nicole, 12/15/2013 , 10:16 AM

## 2013-12-16 NOTE — [facility] Group Notes (Signed)
[facility] Group Notes:  (Clinical Social Work)  12/16/2013   1:15-2:15PM  Summary of Progress/Problems:   The main focus of today's process group was for the patient to identify ways in which they have sabotaged their own mental health wellness/recovery.  Motivational interviewing and a handout were used to explore the benefits and costs of their self-sabotaging behavior as well as the benefits and costs of changing this behavior.  The Stages of Change were explained to the group using a handout, and patients identified where they are with regard to changing self-defeating behaviors.  The patient expressed he self-sabotages with anger.  He first smiled and shook his head a lot, and it was difficult to get an answer from him.  He spoke no further, closed his eyes frequently as though resting, but never went to sleep.  He was in and out of the room several times.  Type of Therapy:  Process Group  Participation Level:  Active  Participation Quality:  Inattentive  Affect:  Not Congruent and Resistant  Cognitive:  Appropriate  Insight:  Limited  Engagement in Therapy:  Limited  Modes of Intervention:  Education, Motivational Interviewing   Ambrose MantleMareida Grossman-Orr, LCSW 12/16/2013, 4:00pm

## 2013-12-16 NOTE — Progress Notes (Signed)
Patient ID: Richard [name], male   DOB: [birth year], 43 y.o.   MRN: [id number] [facility] MD Progress Note  12/16/2013 1:37 PM Richard [name]  MRN:  [id number] Subjective:   " Doing alright"  Objective : I have met with patient. Reports he is trying to calm down so he can get out of here. Reports compliance with meds and is attending groups. Depression continues unchanged and he thinks he will always be depressed.States it is because he has starting over and his family is far away and feeling lonely. Today denies SI and HI saying that it was an initial reaction but he never planned to do it. Regarding initial suicidal /homicidal threats/statements he stated upon admission, he now states " If I were really going to hurt that dude, I would have done it already".  He is trying to just take care of himself and let go of his marital stress.  At this time he is agreeing to ongoing admission / medication management ( of note, patient is admitted under involuntary commitment). He denies side effects from meds and states meds are helping him to have a better day.  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  Eyes: Negative for blurred vision.  Respiratory: Negative for cough and shortness of breath.   Cardiovascular: Negative for chest pain and palpitations.  Gastrointestinal: Negative for nausea, vomiting, abdominal pain and diarrhea.  Musculoskeletal: Negative for back pain and neck pain.  Neurological: Positive for headaches. Negative for dizziness and focal weakness.  Psychiatric/Behavioral: Positive for depression.    Blood  pressure 102/60, pulse 72, temperature 98.5 F (36.9 C), temperature source Oral, resp. rate 18, height 5' 7.5" (1.715 m), weight 97.523 kg (215 lb).Body mass index is 33.16 kg/(m^2).  General Appearance: Well Groomed  Engineer, water::  Good  Speech:  Normal Rate  Volume:  Normal  Mood:  Depressed, less irritable  Affect:  Appropriate and Congruent  Thought Process:  Goal Directed and Linear  Orientation:  Full (Time, Place, and Person)  Thought Content:  at this time denies hallucinations , no delusions  Suicidal Thoughts:  No- see above   Homicidal Thoughts:  No- see above   Memory:  NA  Judgement:  Fair  Insight:  Fair  Psychomotor Activity:  Normal  Concentration:  Good  Recall:  Good  Fund of Knowledge:Good  Language: Good  Akathisia:  Negative  Handed:  Right  AIMS (if indicated):     Assets:  Communication Skills Desire for Improvement Resilience  Sleep:  Number of Hours: 6.25   Musculoskeletal: Strength & Muscle Tone: within normal limits Gait & Station: normal Patient leans: N/A  Current Medications: Current Facility-Administered Medications  Medication Dose Route Frequency Provider Last Rate Last Dose  . acetaminophen (TYLENOL) tablet 650 mg  650 mg Oral Q4H PRN Clarene Reamer, MD      . acetaminophen (TYLENOL) tablet 650 mg  650 mg Oral Q6H PRN Clarene Reamer, MD   650 mg at 12/15/13 2132  . alum & mag hydroxide-simeth (MAALOX/MYLANTA) 200-200-20 MG/5ML suspension 30 mL  30 mL Oral Q4H PRN Eula Fried  Ladona Ridgel, MD      . divalproex (DEPAKOTE ER) 24 hr tablet 500 mg  500 mg Oral BID Nehemiah Massed, MD   500 mg at 12/16/13 1035  . escitalopram (LEXAPRO) tablet 10 mg  10 mg Oral Daily Nehemiah Massed, MD   10 mg at 12/16/13 1036  . haloperidol (HALDOL) tablet 5 mg  5 mg Oral Q8H PRN Nehemiah Massed, MD      . lisinopril (PRINIVIL,ZESTRIL) tablet 2.5 mg  2.5 mg Oral Daily Benjaman Pott, MD   2.5 mg at 12/16/13 1035  . LORazepam (ATIVAN) tablet 1 mg  1 mg Oral Q6H PRN  Nehemiah Massed, MD   1 mg at 12/15/13 2319  . magnesium hydroxide (MILK OF MAGNESIA) suspension 30 mL  30 mL Oral Daily PRN Benjaman Pott, MD      . metFORMIN (GLUCOPHAGE) tablet 500 mg  500 mg Oral BID WC Benjaman Pott, MD   500 mg at 12/16/13 1035  . OLANZapine (ZYPREXA) injection 10 mg  10 mg Intramuscular Once Beau Fanny, FNP       Or  . OLANZapine zydis (ZYPREXA) disintegrating tablet 10 mg  10 mg Oral Once Beau Fanny, FNP      . risperiDONE (RISPERDAL) tablet 2 mg  2 mg Oral QHS Nehemiah Massed, MD      . traZODone (DESYREL) tablet 50 mg  50 mg Oral QHS,MR X 1 Kerry Hough, PA-C   50 mg at 12/15/13 2040    Lab Results:  No results found for this or any previous visit (from the past 48 hour(s)).  Physical Findings: AIMS: Facial and Oral Movements Muscles of Facial Expression: None, normal Lips and Perioral Area: None, normal Jaw: None, normal Tongue: None, normal,Extremity Movements Upper (arms, wrists, hands, fingers): None, normal Lower (legs, knees, ankles, toes): None, normal, Trunk Movements Neck, shoulders, hips: None, normal, Overall Severity Severity of abnormal movements (highest score from questions above): None, normal Incapacitation due to abnormal movements: None, normal Patient's awareness of abnormal movements (rate only patient's report): No Awareness, Dental Status Current problems with teeth and/or dentures?: No Does patient usually wear dentures?: No  CIWA:  CIWA-Ar Total: 1 COWS:  COWS Total Score: 1  Assessment:  As noted above, patient is currently denying any suicidal or homicidal ideations, and has tended to initially minimize the seriousness of homicidal /suicidal statements he made upon admission. Upon further conversation he has been able to discuss his depression regarding loss of marital relationship and lack of social support. Although denying ongoing SI /HI, he has refused to provide name /contact for the person he had expressed  threats towards, and he is still ruminative. He is also presenting more depressed as his initial angry, irritable presentation subsides. As such, I feel that further inpatient support, treatment and monitoring  Is warranted, to help patient improve further and to  assess patient's ongoing stability, presentation .    Treatment Plan Summary: Daily contact with patient to assess and evaluate symptoms and progress in treatment Medication management As below  Plan: Continue treatment with Risperidone , Lexapro, Depakote ER to 500 mgrs BID Ativan /Haldol PRNS for severe agitation if needed. Continue to provide safe setting milieu.   Medical Decision Making Problem Points:  Established problem, stable/improving (1), Review of last therapy session (1) and Review of psycho-social stressors (1) Data Points:  Review and summation of old records (2) Review of medication regiment & side effects (2)  I certify that inpatient  services furnished can reasonably be expected to improve the patient's condition.   Charlcie Cradle 12/16/2013, 1:37 PM

## 2013-12-16 NOTE — Progress Notes (Signed)
Patient ID: Richard [name], male   DOB: 1970-10-26, 43 y.o.   MRN: [id number]  D: Patient pleasant and cooperative with staff. Patient brightens on approach and is interacting well with peers on unit. A: Q 15 minute safety checks, encourage expression of feelings. Encourage group participation and peer interaction. Administer medications as ordered by MD. R: Patient denies SI or plans to harm himself. Pt denies HI at this time. No inappropriate behaviors noted during shift. Pt participated in group session.

## 2013-12-16 NOTE — Progress Notes (Signed)
Psychoeducational Group Note  Date: 12/16/2013 Time: 1015  Group Topic/Focus:  Identifying Needs:   The focus of this group is to help patients identify their personal needs that have been historically problematic and identify healthy behaviors to address their needs.  Participation Level:  Active  Participation Quality:  Appropriate  Affect:  Appropriate  Cognitive:  Appropriate  Insight:  Engaged  Engagement in Group:  Engaged  Additional Comments:    7/11/201512:52 PM Lequan Dobratz, Joie BimlerPatricia Lynn

## 2013-12-16 NOTE — Progress Notes (Addendum)
Patient ID: Richard [name], male   DOB: [birth year], 43 y.o.   MRN: [id number] D: Patient has irritable mood this morning.  He is refusing all medications and attendance to groups.  He has been lying in bed all morning stating, "I'm agitated.  I just don't want to be around anybody right now.  I don't want take those medications."  Patient would not elaborate the cause of his anxiety, electing to turn away from nurse and refuse to answer any questions. He is positive for HI toward the man he wife left his for.He denies any SI/AVH.  A: continue to monitor patient and encourage his participation in his treatment.  Safety checks completed every 15 minutes per protocol.  R:  Patient remains isolative to room.  Update:  Patient took his meds at 1035.  Patient is up in the milieu and interacting with his peers.  Patient's goal is to "take my medication and not come back."  Patient is less irritable.  Mood has improved.

## 2013-12-16 NOTE — Progress Notes (Signed)
[facility] Group Notes:  (Nursing/MHT/Case Management/Adjunct)  Date:  12/16/2013  Time:  11:12 PM  Type of Therapy:  Psychoeducational Skills  Participation Level:  Minimal  Participation Quality:  Inattentive  Affect:  Flat  Cognitive:  Lacking  Insight:  Limited  Engagement in Group:  Limited  Modes of Intervention:  Education  Summary of Progress/Problems: The patient shared in group this evening that his day went "all right". In a similar manner to last evening, the patient was guarded and did not want to share. In terms of the theme for the day, his coping skills are as follows: music and prayer.   Richard [name] S 12/16/2013, 11:12 PM

## 2013-12-16 NOTE — Progress Notes (Signed)
Psychoeducational Group Note  Date:  01/16/2012 Time:  0930 Group Topic/Focus:  Identifying Needs:   The focus of this group is to help patients identify their personal goals that have been historically problematic and identify healthy behaviors to address their goals.  Participation Level:  active Participation Quality: good Affect: flat Cognitive:    Insight:  good  Engagement in Group: engaged  Additional Comments:

## 2013-12-17 DIAGNOSIS — F332 Major depressive disorder, recurrent severe without psychotic features: Secondary | ICD-10-CM

## 2013-12-17 NOTE — Progress Notes (Signed)
D: pt stated his day has been fair. Pt seen sitting in dayroom watching tv, engaging with others. denies si/hi/avh and pain. Pt refused Risperdal, " ive seen commercials on tv about it and what it does to you. i don't want to take it." pt appropriate on unit. A:scheudled medication given. Support and encouragement offered. q 15 min safety checks.  R: pt remains safe on unit. No signs of distress noted.

## 2013-12-17 NOTE — Progress Notes (Signed)
Psychoeducational Group Note  Date: 12/17/2013  Time: 0930  Group Topic/Focus:  Gratefulness: The focus of this group is to help patients identify what two things they are most grateful for in their lives. What helps ground them and to center them on their work to their recovery.  Participation Level: Active  Participation Quality: Appropriate  Affect: Appropriate  Cognitive: Oriented  Insight: Improving  Engagement in Group: Engaged  Additional Comments:  Ayen Viviano A       

## 2013-12-17 NOTE — [facility] Group Notes (Signed)
[facility] Group Notes:  (Clinical Social Work)  12/17/2013   1:15-2:15PM  Summary of Progress/Problems:  The main focus of today's process group was to   identify the patient's current support system and decide on other supports that can be put in place.  The picture on workbook was used to discuss why additional supports are needed.  An emphasis was placed on using counselor, doctor, therapy groups, 12-step groups, and problem-specific support groups to expand supports.   There was also an extensive discussion about what constitutes a healthy support versus an unhealthy support.  The patient was present in group but sleeping.  When confronted about not participating, he stated he thought that was what group is for.  CSW asked him if he is discharging, as he indicated a desire to do so.  Advised him to go tell doctor everything is done for his discharge.  Type of Therapy:  Process Group  Participation Level:  None  Participation Quality:  Inattentive  Affect:  Not congruent  Cognitive:  Oriented  Insight:  None  Engagement in Therapy:  None  Modes of Intervention:  Education,  Support and [employer]Processing  Javyn Havlin Grossman-Orr, LCSW 12/17/2013, 4:00pm

## 2013-12-17 NOTE — Progress Notes (Signed)
Psychoeducational Group Note  Date:  12/17/2013 Time:  1015  Group Topic/Focus:  Making Healthy Choices:   The focus of this group is to help patients identify negative/unhealthy choices they were using prior to admission and identify positive/healthier coping strategies to replace them upon discharge.  Participation Level:  Active  Participation Quality:  Appropriate  Affect:  Appropriate  Cognitive:  Oriented  Insight:  Improving  Engagement in Group:  Engaged  Additional Comments:    Ramesha Poster A 12/17/2013 

## 2013-12-17 NOTE — Progress Notes (Signed)
Adult Psychoeducational Group Note  Date:  12/17/2013 Time:  9:17 PM  Group Topic/Focus:  Wrap-Up Group:   The focus of this group is to help patients review their daily goal of treatment and discuss progress on daily workbooks.  Participation Level:  Active  Participation Quality:  Appropriate and Attentive  Affect:  Appropriate  Cognitive:  Alert and Appropriate  Insight: Appropriate  Engagement in Group:  Engaged  Modes of Intervention:  Discussion, Education and Support  Additional Comments:  Pt was active during this group. Pt rated his day at a 9 out of 10. Pt'[name] goal was to think before he acted, but he struggled with that during an incident regarding the television today. Pt stated he let his anger get the best of him, but he realized the situation was petty and will work towards doing better.   Richard MoanJeffers, Richard [name] 12/17/2013, 9:17 PM

## 2013-12-17 NOTE — Progress Notes (Signed)
[facility] MD Progress Note  12/17/2013 3:12 PM Richard [name]  MRN:  [id number] Subjective:  Patient still feels depressed and rated his depression 3/10 today.  He believes his depression may not completely go away after losing his wife and their marriage.  He is sad because  he is not able to see his two kids but could not explain why he cannot see the children.  Patient reports going to and participating in groups.  He reports learning some coping skills.  He denies SI/HI/AVH.  He reports good sleep and appetite.  We will continue to monitor patient and offer safety and stabilization. 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 Diabetes mellitus   . Hypertension   . Diabetic neuropathy    Axis IV: other psychosocial or environmental problems and problems related to social environment Axis V: 51-60 moderate symptoms  ADL's:  Intact  Sleep: Good  Appetite:  Good  Suicidal Ideation:  Denied Homicidal Ideation:  Denied   Psychiatric Specialty Exam: Physical Exam  ROS  Blood pressure 102/60, pulse 72, temperature 98.5 F (36.9 C), temperature source Oral, resp. rate 18, height 5' 7.5" (1.715 m), weight 215 lb (97.523 kg).Body mass index is 33.16 kg/(m^2).  General Appearance: Casual  Eye Contact::  Good  Speech:  Clear and Coherent and Normal Rate  Volume:  Normal  Mood:  Angry and Depressed  Affect:  Congruent, Depressed and Flat  Thought Process:  Coherent, Goal Directed and Intact  Orientation:  Full (Time, Place, and Person)  Thought Content:  WDL  Suicidal Thoughts:  No   Homicidal Thoughts:  No  Memory:  Immediate;   Good Recent;   Good Remote;   Good  Judgement:  Good  Insight:  Good  Psychomotor Activity:  Normal  Concentration:  Good  Recall:  NA  Fund of Knowledge:Good  Language: Good  Akathisia:  NA  Handed:  Right  AIMS (if indicated):     Assets:  Desire for Improvement  Sleep:  Number of Hours: 6.75   Musculoskeletal: Strength & Muscle Tone: within normal limits Gait & Station: normal Patient leans: N/A  Current Medications: Current Facility-Administered Medications  Medication Dose Route Frequency Provider Last Rate Last Dose  . acetaminophen (TYLENOL) tablet 650 mg  650 mg Oral Q4H PRN Benjaman Pott, MD      . acetaminophen (TYLENOL) tablet 650 mg  650 mg Oral Q6H PRN Benjaman Pott, MD   650 mg at 12/16/13 1549  . alum & mag hydroxide-simeth (MAALOX/MYLANTA) 200-200-20 MG/5ML suspension 30 mL  30 mL Oral Q4H PRN Benjaman Pott, MD      . divalproex (DEPAKOTE ER) 24 hr tablet 500 mg  500 mg Oral BID Nehemiah Massed, MD   500 mg at 12/17/13 0846  . escitalopram (LEXAPRO) tablet 10 mg  10 mg Oral Daily Nehemiah Massed, MD   10 mg at 12/17/13 0846  . haloperidol (HALDOL) tablet 5 mg  5 mg Oral Q8H PRN Nehemiah Massed, MD      . lisinopril (PRINIVIL,ZESTRIL) tablet 2.5 mg  2.5 mg Oral Daily Benjaman Pott, MD   2.5 mg at 12/17/13 0846  .  LORazepam (ATIVAN) tablet 1 mg  1 mg Oral Q6H PRN Nehemiah MassedFernando Cobos, MD   1 mg at 12/16/13 2127  . magnesium hydroxide (MILK OF MAGNESIA) suspension 30 mL  30 mL Oral Daily PRN Benjaman PottGerald D Taylor, MD      . metFORMIN (GLUCOPHAGE) tablet 500 mg  500 mg Oral BID WC Benjaman PottGerald D Taylor, MD   500 mg at 12/17/13 0846  . OLANZapine (ZYPREXA) injection 10 mg  10 mg Intramuscular Once Beau FannyJohn C Withrow, FNP       Or  . OLANZapine zydis (ZYPREXA) disintegrating tablet 10 mg  10 mg Oral Once Beau FannyJohn C Withrow, FNP      . risperiDONE (RISPERDAL) tablet 2 mg  2 mg Oral QHS Nehemiah MassedFernando Cobos, MD      . traZODone (DESYREL) tablet 50  mg  50 mg Oral QHS,MR X 1 Kerry HoughSpencer E Simon, PA-C   50 mg at 12/16/13 2127    Lab Results: No results found for this or any previous visit (from the past 48 hour(s)).  Physical Findings: AIMS: Facial and Oral Movements Muscles of Facial Expression: None, normal Lips and Perioral Area: None, normal Jaw: None, normal Tongue: None, normal,Extremity Movements Upper (arms, wrists, hands, fingers): None, normal Lower (legs, knees, ankles, toes): None, normal, Trunk Movements Neck, shoulders, hips: None, normal, Overall Severity Severity of abnormal movements (highest score from questions above): None, normal Incapacitation due to abnormal movements: None, normal Patient's awareness of abnormal movements (rate only patient's report): No Awareness, Dental Status Current problems with teeth and/or dentures?: No Does patient usually wear dentures?: No  CIWA:  CIWA-Ar Total: 1 COWS:  COWS Total Score: 1  Treatment Plan Summary: Daily contact with patient to assess and evaluate symptoms and progress in treatment Medication management  Plan:  Continue with plan of care Continue crisis management Encourage to participate in group and individual sessions Continue medication management/ and review as needed Lexapro 10 mg po daily for depression Risperdal 2 mg po qhs for mood/agitation Trazodone 50 mg po qhs for insomnia, May repeat  X 1 Depakote 500 mg po bid for mood stabilization Haldol 5 mg po every 8 hours as needed for agitation. Discharge  Plan in progress Address health issues /V/S as needed    Medical Decision Making Problem Points:  Established problem, stable/improving (1) Data Points:  Review and summation of old records (2)  I certify that inpatient services furnished can reasonably be expected to improve the patient's condition.   Dahlia ByesONUOHA, Koa Palla, C   PMHNP-BC 12/17/2013, 3:12 PM

## 2013-12-17 NOTE — Progress Notes (Signed)
Patient ID: Richard [name], male   DOB: [birth year], 43 y.o.   MRN: [id number]  D: Patient with dull, flat affect endorsing depression. Pt with minimal interaction with peers in milieu.  A: Q 15 minute safety checks, encourage staff/peer interaction and group participation. Administer medications as ordered by MD. R: Pt denies SI/HI or plans to harm himself. Pt compliant with medications and group session. No distress noted.

## 2013-12-17 NOTE — Progress Notes (Signed)
D) Pt approaching this writer to talk about the anger his is feeling over the situation in the cafeteria and again in the dayroom. Pt was watching the news on the TV and became upset over the fact that other Pt's wanted to change the channel. Pt requested a 1:1 and was able to state how frustrated and angry he was feeling. In the 1:1 Pt was able to identify his first feelings of fear, feelings of disrespect and feeling belittled. Rates his depression at  A 2 and his hopelessness at a 5 .  Denies SI and HI A) Given support, provided with a 1:1 and praised for his ability to come to this staff member and talk openly about his feelings. R) Pt states he does not want to hurt anyone, not even himself and wants to grow and understand more about his feelings and move forward with his life.

## 2013-12-17 NOTE — Progress Notes (Addendum)
I agree with the assessment and plan except as noted. Platelets level decreased 146, possibly due to Depakote use. Will check another CBC if level remains low may need to d/c Depakote use.

## 2013-12-18 LAB — CBC WITH DIFFERENTIAL/PLATELET
Basophils Absolute: 0 10*3/uL (ref 0.0–0.1)
Basophils Relative: 0 % (ref 0–1)
EOS ABS: 0.2 10*3/uL (ref 0.0–0.7)
Eosinophils Relative: 4 % (ref 0–5)
HEMATOCRIT: 41.3 % (ref 39.0–52.0)
HEMOGLOBIN: 14.4 g/dL (ref 13.0–17.0)
LYMPHS ABS: 3.5 10*3/uL (ref 0.7–4.0)
Lymphocytes Relative: 52 % — ABNORMAL HIGH (ref 12–46)
MCH: 28.9 pg (ref 26.0–34.0)
MCHC: 34.9 g/dL (ref 30.0–36.0)
MCV: 82.8 fL (ref 78.0–100.0)
MONO ABS: 0.4 10*3/uL (ref 0.1–1.0)
MONOS PCT: 6 % (ref 3–12)
Neutro Abs: 2.5 10*3/uL (ref 1.7–7.7)
Neutrophils Relative %: 38 % — ABNORMAL LOW (ref 43–77)
Platelets: 236 10*3/uL (ref 150–400)
RBC: 4.99 MIL/uL (ref 4.22–5.81)
RDW: 13 % (ref 11.5–15.5)
WBC: 6.6 10*3/uL (ref 4.0–10.5)

## 2013-12-18 LAB — VALPROIC ACID LEVEL: Valproic Acid Lvl: 52.5 ug/mL (ref 50.0–100.0)

## 2013-12-18 LAB — GLUCOSE, CAPILLARY: Glucose-Capillary: 117 mg/dL — ABNORMAL HIGH (ref 70–99)

## 2013-12-18 MED ORDER — LISINOPRIL 2.5 MG PO TABS: 2.5000 mg | ORAL_TABLET | Freq: Every day | ORAL | Status: DC

## 2013-12-18 MED ORDER — DIVALPROEX SODIUM ER 500 MG PO TB24: 500.0000 mg | ORAL_TABLET | Freq: Two times a day (BID) | ORAL | Status: DC

## 2013-12-18 MED ORDER — METFORMIN HCL 500 MG PO TABS: 500.0000 mg | ORAL_TABLET | Freq: Two times a day (BID) | ORAL | Status: DC

## 2013-12-18 MED ORDER — TRAZODONE HCL 50 MG PO TABS: 50.0000 mg | ORAL_TABLET | Freq: Every evening | ORAL | Status: DC | PRN

## 2013-12-18 MED ORDER — ESCITALOPRAM OXALATE 10 MG PO TABS: 10.0000 mg | ORAL_TABLET | Freq: Every day | ORAL | Status: DC

## 2013-12-18 NOTE — Progress Notes (Addendum)
[facility] :  Will you be returning to the same living situation after discharge: Yes,  returning home At discharge, do you have transportation home?:Yes,  provided pt with a bus pass Do you have the ability to pay for your medications:Yes,  provided pt with samples and prescriptions.  Pt refused any follow up.   Release of information consent forms completed and in the chart;  Patient's signature needed at discharge.  Patient to Follow up at: Follow-up Information   Follow up with [facility] On 12/19/2013. (Walk in on this date for hospital discharge appointment. Walk in clinic is Monday - Friday 8 am - 3 pm. They will than schedule you for medication management and therapy. )    Contact information:   [address] [place], [place] [address] Phone: [phone] Fax: [phone]      Patient denies SI/HI:   Yes,  denies SI/HI    Safety Planning and Suicide Prevention discussed:  Yes,  discussed with pt.  Pt refused contact with family/friend.  See suicide prevention education note.   Carmina MillerHorton, Kashton Mcartor Nicole 12/18/2013, 9:52 AM

## 2013-12-18 NOTE — Progress Notes (Signed)
Patient ID: Richard [name], male   DOB: 1971/01/14, 43 y.o.   MRN: [id number] DPatient is discharged ambulatory [facility]  Bus pass provided.  Patient was given scripts and a supply of meds by MD.  He denies SI/HI.  He verbalizes understanding of his discharge meds and followup. He initially refused followup.  A- Talked with patient about the importance of staying on his mood stabilizer and BP med and antidepressant.  Talked with him about need to work with a provider to get medication refills.  R- Patient agreed and plans to [name].  z[place] if [place] at shelter [place]

## 2013-12-18 NOTE — [facility] Suicide Risk Assessment (Signed)
Demographic Factors:  43 year old man, separated , has three children. Currently unemployed   Total Time spent with patient: 30 minutes  Psychiatric Specialty Exam: Physical Exam  ROS  Blood pressure 113/72, pulse 87, temperature 98 F (36.7 C), temperature source Oral, resp. rate 18, height 5' 7.5" (1.715 m), weight 97.523 kg (215 lb).Body mass index is 33.16 kg/(m^2).  General Appearance: Well Groomed  Patent attorney::  Good  Speech:  Normal Rate  Volume:  Normal  Mood:  Euthymic  Affect:  Appropriate and Full Range  Thought Process:  Goal Directed and Linear  Orientation:  Full (Time, Place, and Person)  Thought Content:  no hallucinations, no delusions, does not appear internally preoccupied or paranoid  Suicidal Thoughts:  No- at this time denies any suicidal ideations, denies any homicidal ideations, and specifically denies any thoughts of hurting his wife, or the man she is now living with.   Homicidal Thoughts:  No  Memory:  NA  Judgement:  Good  Insight:  Fair  Psychomotor Activity:  Normal  Concentration:  Good  Recall:  Good  Fund of Knowledge:Good  Language: Good  Akathisia:  No  Handed:  Right  AIMS (if indicated):     Assets:  Communication Skills Desire for Improvement Resilience  Sleep:  Number of Hours: 6    Musculoskeletal: Strength & Muscle Tone: within normal limits Gait & Station: normal Patient leans: N/A   Mental Status Per Nursing Assessment::   On Admission:  Thoughts of violence towards others  Current Mental Status by Physician: At this time he is calm, pleasant and well related, mood is euthymic and affect is currently calm and appropriate. Irritability has resolved. No current suicidal or homicidal ideations, and regarding homicidal ideations he made upon admission, he states " I was just angry and needed to vent. I'm not going to do anything". He states he plans to get a job and to " work on myself and making my own life better"   Loss  Factors: Marital separation and lack of access to children  Historical Factors: Impulsivity and history of prior psychiatric admissions  Risk Reduction Factors:   Responsible for children under 16 years of age, Sense of responsibility to family and Positive coping skills or problem solving skills  Continued Clinical Symptoms:  Depression and Anger /Irritability much improved. Currently no SI or HI. Behavior on unit has remained calm.  Cognitive Features That Contribute To Risk:  No gross cognitive deficits noted upon discharge. Patient presents with improved insight, and states he knows he needs to focus on himself and making his own life better  Rather than focusing /ruminating on marital failure/separation.   Suicide Risk:  Mild:  Suicidal ideation of limited frequency, intensity, duration, and specificity.  There are no identifiable plans, no associated intent, mild dysphoria and related symptoms, good self-control (both objective and subjective assessment), few other risk factors, and identifiable protective factors, including available and accessible social support.  Discharge Diagnoses:   AXIS I:  Major Depression, Recurrent severe AXIS II:  Deferred AXIS III:   Past Medical History  Diagnosis Date  . Diabetes mellitus   . Hypertension   . Diabetic neuropathy    AXIS IV:  economic problems and problems related to social environment AXIS V: 60-65 upon discharge  Plan Of Care/Follow-up recommendations:  Activity:  As tolerated  Diet:  Diabetic Diet, low sodium Tests:  NA Other:  See below  Is patient on multiple antipsychotic therapies at discharge:  No   Has Patient had three or more failed trials of antipsychotic monotherapy by history:  No  Recommended Plan for Multiple Antipsychotic Therapies: NA  Patient seen along with Frances Furbishonrad Winthrow, NP.  We reviewed medication regimen and side effects ( Valproic Acid Level therapeutic). Of note patient states Depakote and  SSRI have helped but does not want to take Risperidone due to concerns about its side effects. ( Has been refusing it)  Will D/C medication prior to discharge, as per his request. As noted in prior notes, efforts were made to contact wife/ man she is living with by SW last week, without success due to no known phone or name of man. Police was informed. At THIS time patient no longer homicidal and specifically denying any thoughts of violence. [facility]. [facility] follow up/management.  COBOS, FERNANDO 12/18/2013, 12:21 PM

## 2013-12-18 NOTE — [facility] Group Notes (Signed)
[facility] LCSW Group Therapy  12/18/2013   1:15 PM   Type of Therapy:  Group Therapy  Participation Level:  Active  Participation Quality:  Attentive, Sharing and Supportive  Affect:  Bright  Cognitive:  Alert and Oriented  Insight:  Developing/Improving and Engaged  Engagement in Therapy:  Developing/Improving and Engaged  Modes of Intervention:  Clarification, Confrontation, Discussion, Education, Exploration, Limit-setting, Orientation, Problem-solving, Rapport Building, Dance movement psychotherapisteality Testing, Socialization and Support  Summary of Progress/Problems: Pt identified obstacles faced currently and processed barriers involved in overcoming these obstacles. Pt identified steps necessary for overcoming these obstacles and explored motivation (internal and external) for facing these difficulties head on. Pt further identified one area of concern in their lives and chose a goal to focus on for today. Pt shared that his biggest obstacle will be thinking about his wife being pregnant by another man and letting go of this situation.  This was the first group pt came to, participated in and discussed the situation that led him to the hospital.  Pt states that he has learned he needs to think before talking or acting.  Pt actively participated and was engaged in group discussion.    Richard IvanChelsea Horton, LCSW 12/18/2013  2:59 PM

## 2013-12-18 NOTE — [facility] Group Notes (Signed)
[facility]   12/18/2013 8:45 AM  Participation Quality:  Alert, Appropriate and Oriented  Mood/Affect:  Bright, smiling constantly  Depression Rating:  2  Anxiety Rating:  4  Thoughts of Suicide:  Pt denies SI/HI  Will you contract for safety?   Yes  Current AVH:  Pt denies  Plan for Discharge/Comments:  Pt attended discharge planning group and actively participated in group.  CSW provided pt with today's workbook.  Pt reports feeling "good" today and smiling because he is ready to d/c.  [place] and refused any follow up.  CSW no longer needs to do duty to warn due to pt denying HI for the last 3 days.  No further needs voiced by pt at this time.    Transportation Means: Pt reports access to transportation - provided pt with a bus pass  Supports: No supports mentioned at this time  Reyes IvanChelsea Horton, LCSW 12/18/2013 9:50 AM

## 2013-12-21 NOTE — Progress Notes (Signed)
Patient Discharge Instructions:  After Visit Summary (AVS):   Faxed to:  12/21/13 Psychiatric Admission Assessment Note:   Faxed to:  12/21/13 Suicide Risk Assessment - Discharge Assessment:   Faxed to:  12/21/13 Faxed/Sent to the Next Level Care provider:  12/21/13 [facility]ax[facility] to [facility] @ [phone]  Jerelene ReddenSheena E [place], 12/21/2013, 3:14 PM

## 2014-01-11 NOTE — Discharge Summary (Addendum)
Physician Discharge Summary Note  Patient:  Richard [name] is an 43 y.o., male MRN:  [id number] DOB:  05-19-1971 Patient phone:  [phone] (home)  Patient address:   [address] [place] [address],  Total Time spent with patient: Greater than 30 minutes  Date of Admission:  12/12/2013 Date of Discharge: 12/18/13  Reason for Admission: Mood stabilization, MDD, SI, HI  Discharge Diagnoses: Active Problems:   Depression, major, recurrent, severe with psychosis   Psychiatric Specialty Exam: Physical Exam  Psychiatric: His speech is normal and behavior is normal. Judgment normal. His mood appears not anxious. His affect is not angry, not blunt, not labile and not inappropriate. He is not actively hallucinating. Thought content is not paranoid and not delusional. Cognition and memory are normal. He does not exhibit a depressed mood. He expresses no homicidal and no suicidal ideation.    Review of Systems  Constitutional: Negative.   HENT: Negative.   Eyes: Negative.   Respiratory: Negative.   Cardiovascular: Negative.   Gastrointestinal: Negative.   Genitourinary: Negative.   Musculoskeletal: Negative.   Skin: Negative.   Neurological: Negative.   Endo/Heme/Allergies: Negative.   Psychiatric/Behavioral: Positive for depression (Stabilized with medication prior to discharge) and hallucinations (Stabilized with medication prior to discharge). Negative for suicidal ideas, memory loss and substance abuse. The patient has insomnia (Stabilized with medication prior to dicharge). The patient is not nervous/anxious.     Blood pressure 113/72, pulse 87, temperature 98 F (36.7 C), temperature source Oral, resp. rate 18, height 5' 7.5" (1.715 m), weight 97.523 kg (215 lb).Body mass index is 33.16 kg/(m^2).   General Appearance: Well Groomed   Patent attorneyye Contact:: Good   Speech: Normal Rate   Volume: Normal   Mood: Euthymic   Affect: Appropriate and Full Range   Thought Process: Goal  Directed and Linear   Orientation: Full (Time, Place, and Person)   Thought Content: no hallucinations, no delusions, does not appear internally preoccupied or paranoid   Suicidal Thoughts: No- at this time denies any suicidal ideations, denies any homicidal ideations, and specifically denies any thoughts of hurting his wife, or the man she is now living with.   Homicidal Thoughts: No   Memory: NA   Judgement: Good   Insight: Fair   Psychomotor Activity: Normal   Concentration: Good   Recall: Good   Fund of Knowledge:Good   Language: Good   Akathisia: No   Handed: Right   AIMS (if indicated):   Assets: Communication Skills  Desire for Improvement  Resilience   Sleep: Number of Hours: 6    Musculoskeletal:  Strength & Muscle Tone: within normal limits  Gait & Station: normal  Patient leans: N/A   DSM5: Schizophrenia Disorders:  NA Obsessive-Compulsive Disorders:  NA Trauma-Stressor Disorders:  NA Substance/Addictive Disorders:  NA Depressive Disorders:  MDD (major depressive disorder)  Axis Diagnosis:  AXIS I:  MDD (major depressive disorder) AXIS II:  Deferred AXIS III:   Past Medical History  Diagnosis Date  . Diabetes mellitus   . Hypertension   . Diabetic neuropathy    AXIS IV:  Mental illness, chronic AXIS V:  moderate  Level of Care:  OP  Hospital Course:  [name] was irritable and unwilling to talk much at all indicating he had already gone through all this earlier. The more questions he was asked the more agitated he got. He said he was here for" the same stuff". He did say he was still having suicidal and  homicidal thoughts. The same stuff seems to be that he has been very upset with his wife's estrangement and relationship with another man. He has planned to kill that man and then himself. He has reported to others that he has also been hearing voices but did not tell us that this morning. He has had 2 previous hospitalizations for this issue so far the last  being in June .  During Hospitalization: Medications managed, psychoeducation, group and individual therapy. Pt currently denies SI, HI, and Psychosis. At discharge, pt rates anxiety and depression as minimal. Pt states that he does not have a good supportive home environment and will followup with outpatient treatment. Affirms agreement with medication regimen and discharge plan. Denies other physical and psychological concerns at time of discharge.    Consults:  psychiatry  Significant Diagnostic Studies:  Unremarkable at this time  Discharge Vitals:   Blood pressure 113/72, pulse 87, temperature 98 F (36.7 C), temperature source Oral, resp. rate 18, height 5' 7.5" (1.715 m), weight 97.523 kg (215 lb). Body mass index is 33.16 kg/(m^2). Lab Results:   No results found for this or any previous visit (from the past 72 hour(s)).  Physical Findings: AIMS: Facial and Oral Movements Muscles of Facial Expression: None, normal Lips and Perioral Area: None, normal Jaw: None, normal Tongue: None, normal,Extremity Movements Upper (arms, wrists, hands, fingers): None, normal Lower (legs, knees, ankles, toes): None, normal, Trunk Movements Neck, shoulders, hips: None, normal, Overall Severity Severity of abnormal movements (highest score from questions above): None, normal Incapacitation due to abnormal movements: None, normal Patient's awareness of abnormal movements (rate only patient's report): No Awareness, Dental Status Current problems with teeth and/or dentures?: No Does patient usually wear dentures?: No  CIWA:  CIWA-Ar Total: 1 COWS:  COWS Total Score: 1  Psychiatric Specialty Exam: See Psychiatric Specialty Exam and Suicide Risk Assessment completed by Attending Physician prior to discharge.  Discharge destination:  Home  Is patient on multiple antipsychotic therapies at discharge:  No   Has Patient had three or more failed trials of antipsychotic monotherapy by history:   No  Recommended Plan for Multiple Antipsychotic Therapies: NA    Medication List    STOP taking these medications       risperiDONE 1 MG tablet  Commonly known as:  RISPERDAL      TAKE these medications     Indication   divalproex 500 MG 24 hr tablet  Commonly known as:  DEPAKOTE ER  Take 1 tablet (500 mg total) by mouth 2 (two) times daily.   Indication:  mood stabilization     escitalopram 10 MG tablet  Commonly known as:  LEXAPRO  Take 1 tablet (10 mg total) by mouth daily.   Indication:  mood stabilization     lisinopril 2.5 MG tablet  Commonly known as:  PRINIVIL,ZESTRIL  Take 1 tablet (2.5 mg total) by mouth daily. For high blood pressure   Indication:  High Blood Pressure     metFORMIN 500 MG tablet  Commonly known as:  GLUCOPHAGE  Take 1 tablet (500 mg total) by mouth 2 (two) times daily with a meal.   Indication:  Type 2 Diabetes     traZODone 50 MG tablet  Commonly known as:  DESYREL  Take 1 tablet (50 mg total) by mouth at bedtime and may repeat dose one time if needed.   Indication:  Trouble Sleeping       Follow-up Information   [facility]  On 12/19/2013. (Walk in on this date for hospital discharge appointment. Walk in clinic is Monday - Friday 8 am - 3 pm. They will than schedule you for medication management and therapy. )    Contact information:   [address], [place] [address] Phone: [phone] Fax: [phone]     Follow-up recommendations: Activity:  As tolerated Diet: As recommended by your primary care doctor. Keep all scheduled follow-up appointments as recommended.   Comments: Take all your medications as prescribed by your mental healthcare provider. Report any adverse effects and or reactions from your medicines to your outpatient provider promptly. Patient is instructed and cautioned to not engage in alcohol and or illegal drug use while on prescription medicines. In the event of worsening symptoms, patient is  instructed to call the crisis hotline, 911 and or go to the nearest ED for appropriate evaluation and treatment of symptoms. Follow-up with your primary care provider for your other medical issues, concerns and or health care needs.     Total Discharge Time:  Greater than 30 minutes.  Signed: Beau Fanny, FNP-BC 7/13//2015, 1:00 PM   Patient seen, Suicide Assessment Completed.  Disposition Plan Reviewed

## 2014-01-20 ENCOUNTER — Emergency Department (HOSPITAL_COMMUNITY)
Admission: EM | Admit: 2014-01-20 | Discharge: 2014-01-26 | Disposition: A | Discharge status: Other Acute Inpatient Hospital | Payer: Self-pay

## 2014-01-20 ENCOUNTER — Encounter (HOSPITAL_COMMUNITY): Payer: Self-pay | Admitting: Emergency Medicine

## 2014-01-20 DIAGNOSIS — E1142 Type 2 diabetes mellitus with diabetic polyneuropathy: Secondary | ICD-10-CM | POA: Insufficient documentation

## 2014-01-20 DIAGNOSIS — F332 Major depressive disorder, recurrent severe without psychotic features: Secondary | ICD-10-CM

## 2014-01-20 DIAGNOSIS — I10 Essential (primary) hypertension: Secondary | ICD-10-CM | POA: Insufficient documentation

## 2014-01-20 DIAGNOSIS — F1010 Alcohol abuse, uncomplicated: Secondary | ICD-10-CM | POA: Insufficient documentation

## 2014-01-20 DIAGNOSIS — F17200 Nicotine dependence, unspecified, uncomplicated: Secondary | ICD-10-CM | POA: Insufficient documentation

## 2014-01-20 DIAGNOSIS — R443 Hallucinations, unspecified: Secondary | ICD-10-CM | POA: Insufficient documentation

## 2014-01-20 DIAGNOSIS — E1149 Type 2 diabetes mellitus with other diabetic neurological complication: Secondary | ICD-10-CM | POA: Insufficient documentation

## 2014-01-20 DIAGNOSIS — Z79899 Other long term (current) drug therapy: Secondary | ICD-10-CM | POA: Insufficient documentation

## 2014-01-20 DIAGNOSIS — F322 Major depressive disorder, single episode, severe without psychotic features: Secondary | ICD-10-CM | POA: Diagnosis present

## 2014-01-20 DIAGNOSIS — R45851 Suicidal ideations: Secondary | ICD-10-CM | POA: Insufficient documentation

## 2014-01-20 DIAGNOSIS — F430 Acute stress reaction: Secondary | ICD-10-CM | POA: Insufficient documentation

## 2014-01-20 LAB — COMPREHENSIVE METABOLIC PANEL
ALBUMIN: 4 g/dL (ref 3.5–5.2)
ALK PHOS: 106 U/L (ref 39–117)
ALT: 17 U/L (ref 0–53)
ANION GAP: 12 (ref 5–15)
AST: 16 U/L (ref 0–37)
BUN: 9 mg/dL (ref 6–23)
CHLORIDE: 103 meq/L (ref 96–112)
CO2: 25 mEq/L (ref 19–32)
CREATININE: 0.91 mg/dL (ref 0.50–1.35)
Calcium: 9.8 mg/dL (ref 8.4–10.5)
GFR calc non Af Amer: >90 mL/min (ref >90)
GLUCOSE: 114 mg/dL — AB (ref 70–99)
Potassium: 4.1 mEq/L (ref 3.7–5.3)
Sodium: 140 mEq/L (ref 137–147)
Total Bilirubin: 1 mg/dL (ref 0.3–1.2)
Total Protein: 8.2 g/dL (ref 6.0–8.3)

## 2014-01-20 LAB — CBC
HEMATOCRIT: 45.5 % (ref 39.0–52.0)
HEMOGLOBIN: 15.9 g/dL (ref 13.0–17.0)
MCH: 29.9 pg (ref 26.0–34.0)
MCHC: 34.9 g/dL (ref 30.0–36.0)
MCV: 85.7 fL (ref 78.0–100.0)
Platelets: 203 10*3/uL (ref 150–400)
RBC: 5.31 MIL/uL (ref 4.22–5.81)
RDW: 13.1 % (ref 11.5–15.5)
WBC: 4.5 10*3/uL (ref 4.0–10.5)

## 2014-01-20 LAB — VALPROIC ACID LEVEL: Valproic Acid Lvl: <10 ug/mL — ABNORMAL LOW (ref 50.0–100.0)

## 2014-01-20 LAB — RAPID URINE DRUG SCREEN, HOSP PERFORMED
Amphetamines: NOT DETECTED
BARBITURATES: NOT DETECTED
BENZODIAZEPINES: NOT DETECTED
Cocaine: NOT DETECTED
Opiates: NOT DETECTED
Tetrahydrocannabinol: NOT DETECTED

## 2014-01-20 LAB — CBG MONITORING, ED
Glucose-Capillary: 108 mg/dL — ABNORMAL HIGH (ref 70–99)
Glucose-Capillary: 90 mg/dL (ref 70–99)

## 2014-01-20 LAB — ETHANOL

## 2014-01-20 MED ORDER — ALUM & MAG HYDROXIDE-SIMETH 200-200-20 MG/5ML PO SUSP: 30.0000 mL | ORAL | Status: DC | PRN

## 2014-01-20 MED ORDER — DIVALPROEX SODIUM ER 500 MG PO TB24
500.0000 mg | ORAL_TABLET | Freq: Two times a day (BID) | ORAL | Status: DC
  Administered 2014-01-20 – 2014-01-26 (×13): 500 mg via ORAL
  Filled 2014-01-20 (×14): qty 1

## 2014-01-20 MED ORDER — IBUPROFEN 200 MG PO TABS: 600.0000 mg | ORAL_TABLET | Freq: Three times a day (TID) | ORAL | Status: DC | PRN

## 2014-01-20 MED ORDER — LISINOPRIL 2.5 MG PO TABS
2.5000 mg | ORAL_TABLET | Freq: Every day | ORAL | Status: DC
  Administered 2014-01-20 – 2014-01-26 (×7): 2.5 mg via ORAL
  Filled 2014-01-20 (×7): qty 1

## 2014-01-20 MED ORDER — ESCITALOPRAM OXALATE 10 MG PO TABS
10.0000 mg | ORAL_TABLET | Freq: Every day | ORAL | Status: DC
Start: 2014-01-20 — End: 2014-01-26
  Administered 2014-01-20 – 2014-01-26 (×7): 10 mg via ORAL
  Filled 2014-01-20 (×7): qty 1

## 2014-01-20 MED ORDER — TRAZODONE HCL 50 MG PO TABS
50.0000 mg | ORAL_TABLET | Freq: Every evening | ORAL | Status: DC | PRN
  Administered 2014-01-20 – 2014-01-25 (×6): 50 mg via ORAL
  Filled 2014-01-20 (×7): qty 1

## 2014-01-20 MED ORDER — NICOTINE 21 MG/24HR TD PT24
21.0000 mg | MEDICATED_PATCH | Freq: Every day | TRANSDERMAL | Status: DC
  Filled 2014-01-20: qty 1

## 2014-01-20 MED ORDER — ONDANSETRON HCL 4 MG PO TABS: 4.0000 mg | ORAL_TABLET | Freq: Three times a day (TID) | ORAL | Status: DC | PRN

## 2014-01-20 MED ORDER — METFORMIN HCL 500 MG PO TABS
500.0000 mg | ORAL_TABLET | Freq: Two times a day (BID) | ORAL | Status: DC
  Administered 2014-01-20 – 2014-01-26 (×12): 500 mg via ORAL
  Filled 2014-01-20 (×14): qty 1

## 2014-01-20 MED ORDER — ACETAMINOPHEN 325 MG PO TABS: 650.0000 mg | ORAL_TABLET | ORAL | Status: DC | PRN

## 2014-01-20 NOTE — ED Provider Notes (Signed)
TIME SEEN: 11:30 AM  CHIEF COMPLAINT: Suicidal ideation  HPI: Patient is a 43 y.o. F with history of hypertension, diabetes, depression who presents to the emergency department with complaints of feeling suicidal for the past several weeks. He states that his wife is currently pregnant with another man's baby and this caused increased amount of stress in his household. He states that he has had a prior suicide attempts in the past when he laid it down on the street in traffic. He states that he was at a bridge today and was thinking about jumping off of it. He states he has had voices telling him to hurt himself. No visual hallucinations. No HI. States he did drink a beer today and last night. No illicit drug use. No medical complaints.  ROS: See HPI Constitutional: no fever  Eyes: no drainage  ENT: no runny nose   Cardiovascular:  no chest pain  Resp: no SOB  GI: no vomiting GU: no dysuria Integumentary: no rash  Allergy: no hives  Musculoskeletal: no leg swelling  Neurological: no slurred speech ROS otherwise negative  PAST MEDICAL HISTORY/PAST SURGICAL HISTORY:  Past Medical History  Diagnosis Date  . Diabetes mellitus   . Hypertension   . Diabetic neuropathy     MEDICATIONS:  Prior to Admission medications   Medication Sig Start Date End Date Taking? Authorizing Provider  divalproex (DEPAKOTE ER) 500 MG 24 hr tablet Take 1 tablet (500 mg total) by mouth 2 (two) times daily. 12/18/13  Yes Beau Fanny, FNP  escitalopram (LEXAPRO) 10 MG tablet Take 1 tablet (10 mg total) by mouth daily. 12/18/13  Yes Beau Fanny, FNP  lisinopril (PRINIVIL,ZESTRIL) 2.5 MG tablet Take 1 tablet (2.5 mg total) by mouth daily. For high blood pressure 12/18/13  Yes Beau Fanny, FNP  metFORMIN (GLUCOPHAGE) 500 MG tablet Take 1 tablet (500 mg total) by mouth 2 (two) times daily with a meal. 12/18/13  Yes Beau Fanny, FNP  traZODone (DESYREL) 50 MG tablet Take 1 tablet (50 mg total) by mouth at  bedtime and may repeat dose one time if needed. 12/18/13  Yes Beau Fanny, FNP    ALLERGIES:  No Known Allergies  SOCIAL HISTORY:  History  Substance Use Topics  . Smoking status: Current Every Day Smoker  . Smokeless tobacco: Not on file  . Alcohol Use: No    FAMILY HISTORY: No family history on file.  EXAM: BP 111/74  Pulse 78  Temp(Src) 98.8 F (37.1 C) (Oral)  Resp 16  SpO2 98% CONSTITUTIONAL: Alert and oriented and responds appropriately to questions. Well-appearing; well-nourished HEAD: Normocephalic EYES: Conjunctivae clear, PERRL ENT: normal nose; no rhinorrhea; moist mucous membranes; pharynx without lesions noted NECK: Supple, no meningismus, no LAD  CARD: RRR; S1 and S2 appreciated; no murmurs, no clicks, no rubs, no gallops RESP: Normal chest excursion without splinting or tachypnea; breath sounds clear and equal bilaterally; no wheezes, no rhonchi, no rales,  ABD/GI: Normal bowel sounds; non-distended; soft, non-tender, no rebound, no guarding BACK:  The back appears normal and is non-tender to palpation, there is no CVA tenderness EXT: Normal ROM in all joints; non-tender to palpation; no edema; normal capillary refill; no cyanosis    SKIN: Normal color for age and race; warm NEURO: Moves all extremities equally PSYCH: Patient induces suicidality and command hallucinations. No visual hallucinations. No HI. Grooming and personal hygiene are appropriate.  MEDICAL DECISION MAKING: Patient here with SI. Unable to contract for safety. He  has a plan. The medical complaints. Labs and urine pending. Will consult psychiatry for further evaluation and disposition.  ED PROGRESS: 1:00 PM  Pt's labs and urine are unremarkable. Depakote level is subtherapeutic. He is medically cleared. TTS has evaluated the patient. They recommend inpatient psychiatric treatment. [facility] Working on placement. Patient here  voluntarily.    Layla MawKristen N Ward, DO 01/20/14 1329

## 2014-01-20 NOTE — ED Notes (Signed)
Report received from [facility] RN. Pt. Alert and oriented in no distress denies HI, VH and pain. Pt. States he is still having SI but "I can't hurt myself in the hospital". Pt. Also states he still hears voices "God and the devil, but the devil is more". Will continue to monitor for safety. Pt. Instructed to come to me with problems or concerns. Q 15 minute checks continue.

## 2014-01-20 NOTE — BH Assessment (Signed)
Tele Assessment Note   Richard [name] is an 43 y.o. male who presents to [facility] endorsing SI. Patient was found by police standing on bridge. Patient stated he was thinking of jumping. Patient denies HI. Patient reported having "constant thoughts" of hurting himself. Patient denies AVH but states he feels like his conscious is telling him to hurt himself. Patient stated "I'm tired of feeling like this. Y'all don't understand." Patient reported suicide attempt earlier in the year by laying in the street for a car to run him over. Patient has multiple recent admissions to [facility]. Patient was admitted in July, June and May to [facility] and had a previous admission to [facility] for similar behaviors.   Patient Ox4. Patient denies substance abuse. Patient admitted to drinking one beer the night before and drinking occasionally. Patient reported depressed mood with congruent affect. Patient reports that he lives with his in-laws. Patient reports that he is unemployed. Patient reported resistance to outpatient treatment stating, "I don't think its going to help."   Axis I: Major Depression, Recurrent severe Axis II: Deferred Axis III:  Past Medical History  Diagnosis Date  . Diabetes mellitus   . Hypertension   . Diabetic neuropathy    Axis IV: economic problems, occupational problems and problems with primary support group  Past Medical History:  Past Medical History  Diagnosis Date  . Diabetes mellitus   . Hypertension   . Diabetic neuropathy     Past Surgical History  Procedure Laterality Date  . Mouth surgery    . Tympanostomy tube placement      Family History: No family history on file.  Social History:  reports that he has been smoking.  He does not have any smokeless tobacco history on file. He reports that he does not drink alcohol or use illicit drugs.  Additional Social History:     CIWA: CIWA-Ar BP: 114/76 mmHg Pulse Rate: 63 COWS:    PATIENT STRENGTHS: (choose at least  two) Average or above average intelligence Capable of independent living  Allergies: No Known Allergies  Home Medications:  (Not in a hospital admission)  OB/GYN Status:  No LMP for male patient.  General Assessment Data Location of Assessment: [facility] ED Is this a Tele or Face-to-Face Assessment?: Face-to-Face Is this an Initial Assessment or a Re-assessment for this encounter?: Initial Assessment Living Arrangements: Other relatives (Patient states he is living with his in-laws.) Can pt return to current living arrangement?: Yes Admission Status: Voluntary Is patient capable of signing voluntary admission?: Yes Transfer from: Acute Hospital Referral Source: Self/Family/Friend  Medical Screening Exam [facility] Walk-in ONLY) Medical Exam completed:  (NA)  [facility] Crisis Care Plan Living Arrangements: Other relatives (Patient states he is living with his in-laws.) Name of Psychiatrist: none Name of Therapist: none     Risk to self with the past 6 months Suicidal Ideation: Yes-Currently Present Suicidal Intent: Yes-Currently Present Is patient at risk for suicide?: Yes Suicidal Plan?: Yes-Currently Present Specify Current Suicidal Plan: Patient was standing on a bridge when police arrived. Access to Means: Yes Specify Access to Suicidal Means: Patient was standing on a bridge when police arrived.  What has been your use of drugs/alcohol within the last 12 months?: occassional ETOH Previous Attempts/Gestures: Yes How many times?: 1 Other Self Harm Risks: none reported Triggers for Past Attempts: Spouse contact Intentional Self Injurious Behavior: None Family Suicide History: No Recent stressful life event(s): Conflict (Comment) (Patient's wife is with another man. ) Persecutory voices/beliefs?: No Depression:  Yes Depression Symptoms: Despondent;Insomnia;Isolating;Guilt;Feeling worthless/self pity;Feeling angry/irritable (Hopelessness) Substance abuse history and/or treatment for  substance abuse?: No Suicide prevention information given to non-admitted patients: Not applicable  Risk to Others within the past 6 months Homicidal Ideation: No-Not Currently/Within Last 6 Months Thoughts of Harm to Others: No-Not Currently Present/Within Last 6 Months Comment - Thoughts of Harm to Others: NA Current Homicidal Intent: No-Not Currently/Within Last 6 Months Current Homicidal Plan: No-Not Currently/Within Last 6 Months Describe Current Homicidal Plan: NA Access to Homicidal Means: No Describe Access to Homicidal Means: NA Identified Victim: NA History of harm to others?: No Assessment of Violence: None Noted Violent Behavior Description: Pt calm and cooperative at assessment. Does patient have access to weapons?: No Criminal Charges Pending?: No Does patient have a court date: No  Psychosis Hallucinations: None reported Delusions: None noted  Mental Status Report Appear/Hygiene: In scrubs;Unremarkable Eye Contact: Fair Motor Activity: Unremarkable Speech: Logical/coherent;Soft Level of Consciousness: Quiet/awake;Alert Mood: Depressed Affect: Other (Comment);Blunted Anxiety Level: None Thought Processes: Coherent;Relevant Judgement: Impaired Orientation: Person;Place;Time;Situation Obsessive Compulsive Thoughts/Behaviors: None  Cognitive Functioning Concentration: Normal Memory: Recent Intact;Remote Intact IQ: Average Insight: Poor Impulse Control: Poor Appetite: Fair Weight Gain:  (unk) Sleep: No Change Total Hours of Sleep:  (unk) Vegetative Symptoms: None  ADLScreening [facility]) Patient's cognitive ability adequate to safely complete daily activities?: Yes Patient able to express need for assistance with ADLs?: Yes Independently performs ADLs?: Yes (appropriate for developmental age)  Prior Inpatient Therapy Prior Inpatient Therapy: Yes Prior Therapy Dates: 10/2013, 11/2013, 12/2013 [facility] Reason for Treatment: SI and HI  Prior Outpatient Therapy Prior Outpatient Therapy: No Prior Therapy Dates: NA Prior Therapy Facilty/Provider(s): NA Reason for Treatment: NA  ADL Screening (condition at time of admission) Patient's cognitive ability adequate to safely complete daily activities?: Yes Patient able to express need for assistance with ADLs?: Yes Independently performs ADLs?: Yes (appropriate for developmental age)   Values / Beliefs Cultural Requests During Hospitalization: None Spiritual Requests During Hospitalization: None   Additional Information 1:1 In Past 12 Months?: No CIRT Risk: No Elopement Risk: No Does patient have medical clearance?: Yes    Disposition: Clinician consulted with Catha NottinghamJamison, NP who recommends patient for inpatient treatment. [facility] will seek treatment.   Disposition Initial Assessment Completed for this Encounter: Yes Disposition of Patient: Inpatient treatment program Type of inpatient treatment program: Adult  Hessie DibbleROBERTS, Shneur [place] R 01/20/2014 1:03 PM

## 2014-01-20 NOTE — Progress Notes (Signed)
The following facilities have been contacted regarding bed availability:  [facility]- per Eber Jonesarolyn can fax, having d/c's later today [facility]- per Dannielle Huhanny can fax for review [facility]- per Tamera PuntMiranda can fax for review [facility]- [facility]- [facility]ed [facility]- [facility]- [facility]- per Ranita, "looking at their ED right now" [facility]- per Belenda CruiseKristin only adult male beds available [facility]- [facility]- [facility]- per [facility]- per Amy can fax low acuity adult, referral [facility]ed [facility]- per Sue Lushndrea adult male beds available only   [facility] Briona Korpela Disposition MHT

## 2014-01-20 NOTE — ED Notes (Signed)
TTS into see 

## 2014-01-20 NOTE — ED Notes (Signed)
Report called to [place], RN pt to go to room 37 after CBG

## 2014-01-20 NOTE — ED Notes (Signed)
He tells us he is "hearing voices and I'm suicidal".  He chooses not to elaborate further.  He is in no distress.  He is initially resistive of our care, but soon decides to cooperate.

## 2014-01-20 NOTE — ED Notes (Signed)
Patient is in burgundy scrubs and yellow socks.  Patient has been wanded by security.  He has one personal belonging bag with a cell phone and a pair of earrings

## 2014-01-20 NOTE — Progress Notes (Signed)
[facility] by Dr. Otelia SanteeFarrah per Dannielle Huhanny d/t acuity.   Tomi BambergerMariya Salvatore Shear Disposition MHT

## 2014-01-20 NOTE — ED Notes (Signed)
Up to the bathroom 

## 2014-01-21 LAB — CBG MONITORING, ED
GLUCOSE-CAPILLARY: 82 mg/dL (ref 70–99)
Glucose-Capillary: 95 mg/dL (ref 70–99)

## 2014-01-21 NOTE — Consult Note (Signed)
[facility] Face-to-Face Psychiatry Consult   Reason for Consult:  Depression, suicidal thoughts Referring Physician:  EDP Richard [name] is an 43 y.o. male. Total Time spent with patient: 1 hour  Assessment: AXIS I:  Major Depression, Recurrent severe AXIS II:  Borderline Personality Dis. AXIS III:   Past Medical History  Diagnosis Date  . Diabetes mellitus   . Hypertension   . Diabetic neuropathy    AXIS IV:  housing problems, occupational problems, other psychosocial or environmental problems, problems related to social environment and problems with primary support group AXIS V:  41-50 serious symptoms  Plan:  Recommend psychiatric Inpatient admission when medically cleared.  Subjective:   Richard [name] is a 43 y.o. male patient admitted with Depression, Suicidal thoughts.  HPI:  AA male 43 y.o. who presents to [facility] endorsing SI. Patient was found by police standing on bridge. Patient stated he was thinking of jumping. Patient denies HI. Patient reported having "constant thoughts" of hurting himself. Patient denies AVH but states he feels like his conscious is telling him to hurt himself. Patient stated was recently hospitalized and discharged from our inpatient Psychiatric unit in July.." Patient reported suicide attempt earlier in the year by laying in the street for a car to run him over. Patient has multiple admissions to [facility].  Patient stated today that he is not doing well and that he is still suffering from the effect of his wife leaving him for another man.  Patient states that he is depressed and stressed out that his wife is pregnant with another man while they are still married.  Patient states he is not compliant with his medications because he cannot afford his medications.  Patient was not able to contract for safety.  He denied HI/AVH.  We have accepted him for admission and will seek for bed at any facility with available  Bed.  HPI Elements:   Location:  Suicidal thought, major  depression, feelings of hopelessness, helplessness and anger. Quality:  severe. Severity:  severe, cannot function.. Context:  Marrital discord..  Past Psychiatric History: Past Medical History  Diagnosis Date  . Diabetes mellitus   . Hypertension   . Diabetic neuropathy     reports that he has been smoking.  He does not have any smokeless tobacco history on file. He reports that he does not drink alcohol or use illicit drugs. No family history on file. Family History Substance Abuse: No Family Supports: No Living Arrangements: Other relatives (Patient states he is living with his in-laws.) Can pt return to current living arrangement?: Yes   Allergies:  No Known Allergies  ACT Assessment Complete:  Yes:    Educational Status    Risk to Self: Risk to self with the past 6 months Suicidal Ideation: Yes-Currently Present Suicidal Intent: Yes-Currently Present Is patient at risk for suicide?: Yes Suicidal Plan?: Yes-Currently Present Specify Current Suicidal Plan: Patient was standing on a bridge when police arrived. Access to Means: Yes Specify Access to Suicidal Means: Patient was standing on a bridge when police arrived.  What has been your use of drugs/alcohol within the last 12 months?: occassional ETOH Previous Attempts/Gestures: Yes How many times?: 1 Other Self Harm Risks: none reported Triggers for Past Attempts: Spouse contact Intentional Self Injurious Behavior: None Family Suicide History: No Recent stressful life event(s): Conflict (Comment) (Patient's wife is with another man. ) Persecutory voices/beliefs?: No Depression: Yes Depression Symptoms: Despondent;Insomnia;Isolating;Guilt;Feeling worthless/self pity;Feeling angry/irritable (Hopelessness) Substance abuse history and/or treatment for substance abuse?: No  Suicide prevention information given to non-admitted patients: Not applicable  Risk to Others: Risk to Others within the past 6 months Homicidal  Ideation: No-Not Currently/Within Last 6 Months Thoughts of Harm to Others: No-Not Currently Present/Within Last 6 Months Comment - Thoughts of Harm to Others: NA Current Homicidal Intent: No-Not Currently/Within Last 6 Months Current Homicidal Plan: No-Not Currently/Within Last 6 Months Describe Current Homicidal Plan: NA Access to Homicidal Means: No Describe Access to Homicidal Means: NA Identified Victim: NA History of harm to others?: No Assessment of Violence: None Noted Violent Behavior Description: Pt calm and cooperative at assessment. Does patient have access to weapons?: No Criminal Charges Pending?: No Does patient have a court date: No  Abuse:    Prior Inpatient Therapy: Prior Inpatient Therapy Prior Inpatient Therapy: Yes Prior Therapy Dates: 10/2013, 11/2013, 12/2013 Prior Therapy Facilty/Provider(s): [facility], [facility] Reason for Treatment: SI and HI  Prior Outpatient Therapy: Prior Outpatient Therapy Prior Outpatient Therapy: No Prior Therapy Dates: NA Prior Therapy Facilty/Provider(s): NA Reason for Treatment: NA  Additional Information: Additional Information 1:1 In Past 12 Months?: No CIRT Risk: No Elopement Risk: No Does patient have medical clearance?: Yes     Objective: Blood pressure 114/64, pulse 66, temperature 98.4 F (36.9 C), temperature source Oral, resp. rate 16, SpO2 100.00%.There is no weight on file to calculate BMI. Results for orders placed during the hospital encounter of 01/20/14 (from the past 72 hour(s))  CBC     Status: None   Collection Time    01/20/14 11:22 AM      Result Value Ref Range   WBC 4.5  4.0 - 10.5 K/uL   RBC 5.31  4.22 - 5.81 MIL/uL   Hemoglobin 15.9  13.0 - 17.0 g/dL   HCT 45.5  39.0 - 52.0 %   MCV 85.7  78.0 - 100.0 fL   MCH 29.9  26.0 - 34.0 pg   MCHC 34.9  30.0 - 36.0 g/dL   RDW 13.1  11.5 - 15.5 %   Platelets 203  150 - 400 K/uL  COMPREHENSIVE METABOLIC PANEL     Status: Abnormal   Collection Time     01/20/14 11:22 AM      Result Value Ref Range   Sodium 140  137 - 147 mEq/L   Potassium 4.1  3.7 - 5.3 mEq/L   Chloride 103  96 - 112 mEq/L   CO2 25  19 - 32 mEq/L   Glucose, Bld 114 (*) 70 - 99 mg/dL   BUN 9  6 - 23 mg/dL   Creatinine, Ser 0.91  0.50 - 1.35 mg/dL   Calcium 9.8  8.4 - 10.5 mg/dL   Total Protein 8.2  6.0 - 8.3 g/dL   Albumin 4.0  3.5 - 5.2 g/dL   AST 16  0 - 37 U/L   ALT 17  0 - 53 U/L   Alkaline Phosphatase 106  39 - 117 U/L   Total Bilirubin 1.0  0.3 - 1.2 mg/dL   GFR calc non Af Amer >90  >90 mL/min   GFR calc Af Amer >90  >90 mL/min   Comment: (NOTE)     The eGFR has been calculated using the CKD EPI equation.     This calculation has not been validated in all clinical situations.     eGFR's persistently <90 mL/min signify possible Chronic Kidney     Disease.   Anion gap 12  5 - 15  ETHANOL  Status: None   Collection Time    01/20/14 11:22 AM      Result Value Ref Range   Alcohol, Ethyl (B) <11  0 - 11 mg/dL   Comment:            LOWEST DETECTABLE LIMIT FOR     SERUM ALCOHOL IS 11 mg/dL     FOR MEDICAL PURPOSES ONLY  URINE RAPID DRUG SCREEN (HOSP PERFORMED)     Status: None   Collection Time    01/20/14 11:23 AM      Result Value Ref Range   Opiates NONE DETECTED  NONE DETECTED   Cocaine NONE DETECTED  NONE DETECTED   Benzodiazepines NONE DETECTED  NONE DETECTED   Amphetamines NONE DETECTED  NONE DETECTED   Tetrahydrocannabinol NONE DETECTED  NONE DETECTED   Barbiturates NONE DETECTED  NONE DETECTED   Comment:            DRUG SCREEN FOR MEDICAL PURPOSES     ONLY.  IF CONFIRMATION IS NEEDED     FOR ANY PURPOSE, NOTIFY LAB     WITHIN 5 DAYS.                LOWEST DETECTABLE LIMITS     FOR URINE DRUG SCREEN     Drug Class       Cutoff (ng/mL)     Amphetamine      1000     Barbiturate      200     Benzodiazepine   034     Tricyclics       742     Opiates          300     Cocaine          300     THC              50  VALPROIC ACID LEVEL      Status: Abnormal   Collection Time    01/20/14 11:38 AM      Result Value Ref Range   Valproic Acid Lvl <10.0 (*) 50.0 - 100.0 ug/mL   Comment: PERFORMED AT [facility]     Performed at [place], ED     Status: Abnormal   Collection Time    01/20/14 11:40 AM      Result Value Ref Range   Glucose-Capillary 108 (*) 70 - 99 mg/dL   Comment 1 Notify RN    CBG MONITORING, ED     Status: None   Collection Time    01/20/14  5:29 PM      Result Value Ref Range   Glucose-Capillary 90  70 - 99 mg/dL  CBG MONITORING, ED     Status: None   Collection Time    01/21/14  7:41 AM      Result Value Ref Range   Glucose-Capillary 95  70 - 99 mg/dL  CBG MONITORING, ED     Status: None   Collection Time    01/21/14 12:37 PM      Result Value Ref Range   Glucose-Capillary 82  70 - 99 mg/dL   Labs are reviewed and are pertinent for Unremarkable results.  Current Facility-Administered Medications  Medication Dose Route Frequency Provider Last Rate Last Dose  . acetaminophen (TYLENOL) tablet 650 mg  650 mg Oral Q4H PRN Kristen N Ward, DO      . alum & mag hydroxide-simeth (MAALOX/MYLANTA) 200-200-20 MG/5ML suspension 30  mL  30 mL Oral PRN Kristen N Ward, DO      . divalproex (DEPAKOTE ER) 24 hr tablet 500 mg  500 mg Oral BID Kristen N Ward, DO   500 mg at 01/21/14 1006  . escitalopram (LEXAPRO) tablet 10 mg  10 mg Oral Daily Kristen N Ward, DO   10 mg at 01/21/14 1006  . ibuprofen (ADVIL,MOTRIN) tablet 600 mg  600 mg Oral Q8H PRN Kristen N Ward, DO      . lisinopril (PRINIVIL,ZESTRIL) tablet 2.5 mg  2.5 mg Oral Daily Kristen N Ward, DO   2.5 mg at 01/21/14 1006  . metFORMIN (GLUCOPHAGE) tablet 500 mg  500 mg Oral BID WC Kristen N Ward, DO   500 mg at 01/21/14 0845  . nicotine (NICODERM CQ - dosed in mg/24 hours) patch 21 mg  21 mg Transdermal Daily Kristen N Ward, DO      . ondansetron (ZOFRAN) tablet 4 mg  4 mg Oral Q8H PRN Kristen N Ward, DO      . traZODone  (DESYREL) tablet 50 mg  50 mg Oral QHS,MR X 1 Kristen N Ward, DO   50 mg at 01/20/14 2134   Current Outpatient Prescriptions  Medication Sig Dispense Refill  . divalproex (DEPAKOTE ER) 500 MG 24 hr tablet Take 1 tablet (500 mg total) by mouth 2 (two) times daily.  60 tablet  0  . escitalopram (LEXAPRO) 10 MG tablet Take 1 tablet (10 mg total) by mouth daily.  30 tablet  0  . lisinopril (PRINIVIL,ZESTRIL) 2.5 MG tablet Take 1 tablet (2.5 mg total) by mouth daily. For high blood pressure  30 tablet  0  . metFORMIN (GLUCOPHAGE) 500 MG tablet Take 1 tablet (500 mg total) by mouth 2 (two) times daily with a meal.  60 tablet  0  . traZODone (DESYREL) 50 MG tablet Take 1 tablet (50 mg total) by mouth at bedtime and may repeat dose one time if needed.  14 tablet  0    Psychiatric Specialty Exam:     Blood pressure 114/64, pulse 66, temperature 98.4 F (36.9 C), temperature source Oral, resp. rate 16, SpO2 100.00%.There is no weight on file to calculate BMI.  General Appearance: Casual  Eye Contact::  Poor  Speech:  Clear and Coherent and Normal Rate  Volume:  Normal  Mood:  Angry, Depressed, Hopeless, Irritable and Worthless  Affect:  Congruent, Depressed and Flat  Thought Process:  Coherent, Goal Directed and Intact  Orientation:  Full (Time, Place, and Person)  Thought Content:  WDL  Suicidal Thoughts:  Yes  Homicidal Thoughts:  No  Memory:  Immediate;   Fair Recent;   Fair Remote;   Fair  Judgement:  Poor  Insight:  Fair  Psychomotor Activity:  Normal  Concentration:  Poor  Recall:  NA  Fund of Knowledge:Good  Language: Good  Akathisia:  NA  Handed:  Right  AIMS (if indicated):     Assets:  Desire for Improvement  Sleep:      Musculoskeletal: Strength & Muscle Tone: within normal limits Gait & Station: normal Patient leans: N/A  Treatment Plan Summary:  ACCEPTED FOR ADMISSION AND PENDING BED AVAILABLITY Daily contact with patient to assess and evaluate symptoms and  progress in treatment Medication management  Delfin Gant  PMHNP-BC 01/21/2014 5:43 PM  Patient is seen face to face for psychiatric evaluation, case discussed with physician extender and treatment team, and formulated appropriate treatment and disposition plan. Reviewed the  information documented and agree with the treatment plan.   Tamika Nou,JANARDHAHA R. 01/21/2014 9:01 PM

## 2014-01-21 NOTE — Progress Notes (Signed)
MHT received follow up calls from the following hospitals:  1[facility] review 2)[facility]-no beds, holding referral if bed opens  Blain PaisMichelle L Xayvion Shirah, MHT/NS

## 2014-01-21 NOTE — ED Notes (Signed)
Up tot he bathroom to shower and change scrubs 

## 2014-01-21 NOTE — BH Assessment (Signed)
[facility] per Darreld Mcleanrina.  Dossie Arbour-Quentina Fronek, MA  Disposition MHT

## 2014-01-21 NOTE — ED Notes (Signed)
Dr J and Josephine NP into see 

## 2014-01-21 NOTE — BH Assessment (Signed)
Ref[facility] fax[facility] to:  [facility]  -Dossie ArbourAnthony Naeemah [place]asmer, [place]  Disposition MHT

## 2014-01-22 ENCOUNTER — Encounter (HOSPITAL_COMMUNITY): Payer: Self-pay | Admitting: Psychiatry

## 2014-01-22 DIAGNOSIS — F322 Major depressive disorder, single episode, severe without psychotic features: Secondary | ICD-10-CM | POA: Diagnosis present

## 2014-01-22 DIAGNOSIS — F332 Major depressive disorder, recurrent severe without psychotic features: Secondary | ICD-10-CM

## 2014-01-22 LAB — CBG MONITORING, ED
Glucose-Capillary: 83 mg/dL (ref 70–99)
Glucose-Capillary: 85 mg/dL (ref 70–99)

## 2014-01-22 NOTE — ED Notes (Signed)
Patient reports SI. Denies HI, AVH. Contracts for safety on the unit. Rates feelings of anxiety 6/10, feelings of depression 8/10. Minimal interaction with Clinical research associatewriter. Patient appears sad, is calm, cooperative.  Encouragement offered.   Q 15 safety checks continue.

## 2014-01-22 NOTE — Consult Note (Signed)
[facility] Face-to-Face Psychiatry Consult   Reason for Consult:  Depression, suicidal thoughts Referring Physician:  EDP Richard [name] is an 43 y.o. male. Total Time spent with patient: 20 minutes  Assessment: AXIS I:  Major Depression, Recurrent severe AXIS II:  Borderline Personality Dis. AXIS III:   Past Medical History  Diagnosis Date  . Diabetes mellitus   . Hypertension   . Diabetic neuropathy    AXIS IV:  housing problems, occupational problems, other psychosocial or environmental problems, problems related to social environment and problems with primary support group AXIS V:  41-50 serious symptoms  Plan:  Recommend psychiatric Inpatient admission when medically cleared.  Subjective:   Richard [name] is a 43 y.o. male patient admitted with Depression, Suicidal thoughts.  HPI:  Patient continues to have suicidal ideations.  He frequently discusses his separation/divorce from his wife like it is a recent event but it was 2 years ago.  Richard [name] states he lives with his in-laws but talks about walking the streets as if he is homeless; incongruence in history.  His Depakote level is low as he has not been taking it until the past week.  HPI Elements:   Location:  Suicidal thought, major depression, feelings of hopelessness, helplessness and anger. Quality:  severe. Severity:  severe, cannot function.. Context:  Marrital discord..  Past Psychiatric History: Past Medical History  Diagnosis Date  . Diabetes mellitus   . Hypertension   . Diabetic neuropathy     reports that he has been smoking.  He does not have any smokeless tobacco history on file. He reports that he does not drink alcohol or use illicit drugs. History reviewed. No pertinent family history. Family History Substance Abuse: No Family Supports: No Living Arrangements: Other relatives (Patient states he is living with his in-laws.) Can pt return to current living arrangement?: Yes   Allergies:  No Known  Allergies  ACT Assessment Complete:  Yes:    Educational Status    Risk to Self: Risk to self with the past 6 months Suicidal Ideation: Yes-Currently Present Suicidal Intent: Yes-Currently Present Is patient at risk for suicide?: Yes Suicidal Plan?: Yes-Currently Present Specify Current Suicidal Plan: Patient was standing on a bridge when police arrived. Access to Means: Yes Specify Access to Suicidal Means: Patient was standing on a bridge when police arrived.  What has been your use of drugs/alcohol within the last 12 months?: occassional ETOH Previous Attempts/Gestures: Yes How many times?: 1 Other Self Harm Risks: none reported Triggers for Past Attempts: Spouse contact Intentional Self Injurious Behavior: None Family Suicide History: No Recent stressful life event(s): Conflict (Comment) (Patient's wife is with another man. ) Persecutory voices/beliefs?: No Depression: Yes Depression Symptoms: Despondent;Insomnia;Isolating;Guilt;Feeling worthless/self pity;Feeling angry/irritable (Hopelessness) Substance abuse history and/or treatment for substance abuse?: No Suicide prevention information given to non-admitted patients: Not applicable  Risk to Others: Risk to Others within the past 6 months Homicidal Ideation: No-Not Currently/Within Last 6 Months Thoughts of Harm to Others: No-Not Currently Present/Within Last 6 Months Comment - Thoughts of Harm to Others: NA Current Homicidal Intent: No-Not Currently/Within Last 6 Months Current Homicidal Plan: No-Not Currently/Within Last 6 Months Describe Current Homicidal Plan: NA Access to Homicidal Means: No Describe Access to Homicidal Means: NA Identified Victim: NA History of harm to others?: No Assessment of Violence: None Noted Violent Behavior Description: Pt calm and cooperative at assessment. Does patient have access to weapons?: No Criminal Charges Pending?: No Does patient have a court date: No  Abuse:  Prior  Inpatient Therapy: Prior Inpatient Therapy Prior Inpatient Therapy: Yes Prior Therapy Dates: 10/2013, 11/2013, 12/2013 Prior Therapy Facilty/Provider(s): [facility] Reason for Treatment: SI and HI  Prior Outpatient Therapy: Prior Outpatient Therapy Prior Outpatient Therapy: No Prior Therapy Dates: NA Prior Therapy Facilty/Provider(s): NA Reason for Treatment: NA  Additional Information: Additional Information 1:1 In Past 12 Months?: No CIRT Risk: No Elopement Risk: No Does patient have medical clearance?: Yes     Objective: Blood pressure 106/67, pulse 65, temperature 97.8 F (36.6 C), temperature source Oral, resp. rate 18, SpO2 100.00%.There is no weight on file to calculate BMI. Results for orders placed during the hospital encounter of 01/20/14 (from the past 72 hour(s))  CBC     Status: None   Collection Time    01/20/14 11:22 AM      Result Value Ref Range   WBC 4.5  4.0 - 10.5 K/uL   RBC 5.31  4.22 - 5.81 MIL/uL   Hemoglobin 15.9  13.0 - 17.0 g/dL   HCT 45.5  39.0 - 52.0 %   MCV 85.7  78.0 - 100.0 fL   MCH 29.9  26.0 - 34.0 pg   MCHC 34.9  30.0 - 36.0 g/dL   RDW 13.1  11.5 - 15.5 %   Platelets 203  150 - 400 K/uL  COMPREHENSIVE METABOLIC PANEL     Status: Abnormal   Collection Time    01/20/14 11:22 AM      Result Value Ref Range   Sodium 140  137 - 147 mEq/L   Potassium 4.1  3.7 - 5.3 mEq/L   Chloride 103  96 - 112 mEq/L   CO2 25  19 - 32 mEq/L   Glucose, Bld 114 (*) 70 - 99 mg/dL   BUN 9  6 - 23 mg/dL   Creatinine, Ser 0.91  0.50 - 1.35 mg/dL   Calcium 9.8  8.4 - 10.5 mg/dL   Total Protein 8.2  6.0 - 8.3 g/dL   Albumin 4.0  3.5 - 5.2 g/dL   AST 16  0 - 37 U/L   ALT 17  0 - 53 U/L   Alkaline Phosphatase 106  39 - 117 U/L   Total Bilirubin 1.0  0.3 - 1.2 mg/dL   GFR calc non Af Amer >90  >90 mL/min   GFR calc Af Amer >90  >90 mL/min   Comment: (NOTE)     The eGFR has been calculated using the CKD EPI equation.     This calculation has not been  validated in all clinical situations.     eGFR's persistently <90 mL/min signify possible Chronic Kidney     Disease.   Anion gap 12  5 - 15  ETHANOL     Status: None   Collection Time    01/20/14 11:22 AM      Result Value Ref Range   Alcohol, Ethyl (B) <11  0 - 11 mg/dL   Comment:            LOWEST DETECTABLE LIMIT FOR     SERUM ALCOHOL IS 11 mg/dL     FOR MEDICAL PURPOSES ONLY  URINE RAPID DRUG SCREEN (HOSP PERFORMED)     Status: None   Collection Time    01/20/14 11:23 AM      Result Value Ref Range   Opiates NONE DETECTED  NONE DETECTED   Cocaine NONE DETECTED  NONE DETECTED   Benzodiazepines NONE DETECTED  NONE DETECTED  Amphetamines NONE DETECTED  NONE DETECTED   Tetrahydrocannabinol NONE DETECTED  NONE DETECTED   Barbiturates NONE DETECTED  NONE DETECTED   Comment:            DRUG SCREEN FOR MEDICAL PURPOSES     ONLY.  IF CONFIRMATION IS NEEDED     FOR ANY PURPOSE, NOTIFY LAB     WITHIN 5 DAYS.                LOWEST DETECTABLE LIMITS     FOR URINE DRUG SCREEN     Drug Class       Cutoff (ng/mL)     Amphetamine      1000     Barbiturate      200     Benzodiazepine   462     Tricyclics       703     Opiates          300     Cocaine          300     THC              50  VALPROIC ACID LEVEL     Status: Abnormal   Collection Time    01/20/14 11:38 AM      Result Value Ref Range   Valproic Acid Lvl <10.0 (*) 50.0 - 100.0 ug/mL   Comment: PERFORMED AT [facility]     Performed at [place], ED     Status: Abnormal   Collection Time    01/20/14 11:40 AM      Result Value Ref Range   Glucose-Capillary 108 (*) 70 - 99 mg/dL   Comment 1 Notify RN    CBG MONITORING, ED     Status: None   Collection Time    01/20/14  5:29 PM      Result Value Ref Range   Glucose-Capillary 90  70 - 99 mg/dL  CBG MONITORING, ED     Status: None   Collection Time    01/21/14  7:41 AM      Result Value Ref Range   Glucose-Capillary 95  70 - 99  mg/dL  CBG MONITORING, ED     Status: None   Collection Time    01/21/14 12:37 PM      Result Value Ref Range   Glucose-Capillary 82  70 - 99 mg/dL  CBG MONITORING, ED     Status: None   Collection Time    01/22/14  8:02 AM      Result Value Ref Range   Glucose-Capillary 83  70 - 99 mg/dL   Comment 1 Notify RN     Labs are reviewed and are pertinent for Unremarkable results.  Current Facility-Administered Medications  Medication Dose Route Frequency Provider Last Rate Last Dose  . acetaminophen (TYLENOL) tablet 650 mg  650 mg Oral Q4H PRN Kristen N Ward, DO      . alum & mag hydroxide-simeth (MAALOX/MYLANTA) 200-200-20 MG/5ML suspension 30 mL  30 mL Oral PRN Kristen N Ward, DO      . divalproex (DEPAKOTE ER) 24 hr tablet 500 mg  500 mg Oral BID Kristen N Ward, DO   500 mg at 01/21/14 2203  . escitalopram (LEXAPRO) tablet 10 mg  10 mg Oral Daily Kristen N Ward, DO   10 mg at 01/21/14 1006  . ibuprofen (ADVIL,MOTRIN) tablet 600 mg  600 mg Oral Q8H PRN Delice Bison  Ward, DO      . lisinopril (PRINIVIL,ZESTRIL) tablet 2.5 mg  2.5 mg Oral Daily Kristen N Ward, DO   2.5 mg at 01/21/14 1006  . metFORMIN (GLUCOPHAGE) tablet 500 mg  500 mg Oral BID WC Kristen N Ward, DO   500 mg at 01/21/14 1816  . nicotine (NICODERM CQ - dosed in mg/24 hours) patch 21 mg  21 mg Transdermal Daily Kristen N Ward, DO      . ondansetron (ZOFRAN) tablet 4 mg  4 mg Oral Q8H PRN Kristen N Ward, DO      . traZODone (DESYREL) tablet 50 mg  50 mg Oral QHS,MR X 1 Kristen N Ward, DO   50 mg at 01/21/14 2204   Current Outpatient Prescriptions  Medication Sig Dispense Refill  . divalproex (DEPAKOTE ER) 500 MG 24 hr tablet Take 1 tablet (500 mg total) by mouth 2 (two) times daily.  60 tablet  0  . escitalopram (LEXAPRO) 10 MG tablet Take 1 tablet (10 mg total) by mouth daily.  30 tablet  0  . lisinopril (PRINIVIL,ZESTRIL) 2.5 MG tablet Take 1 tablet (2.5 mg total) by mouth daily. For high blood pressure  30 tablet  0  .  metFORMIN (GLUCOPHAGE) 500 MG tablet Take 1 tablet (500 mg total) by mouth 2 (two) times daily with a meal.  60 tablet  0  . traZODone (DESYREL) 50 MG tablet Take 1 tablet (50 mg total) by mouth at bedtime and may repeat dose one time if needed.  14 tablet  0    Psychiatric Specialty Exam:     Blood pressure 106/67, pulse 65, temperature 97.8 F (36.6 C), temperature source Oral, resp. rate 18, SpO2 100.00%.There is no weight on file to calculate BMI.  General Appearance: Casual  Eye Contact::  Poor  Speech:  Clear and Coherent and Normal Rate  Volume:  Normal  Mood:  Angry, Depressed, Hopeless, Irritable and Worthless  Affect:  Congruent, Depressed and Flat  Thought Process:  Coherent, Goal Directed and Intact  Orientation:  Full (Time, Place, and Person)  Thought Content:  WDL  Suicidal Thoughts:  Yes  Homicidal Thoughts:  No  Memory:  Immediate;   Fair Recent;   Fair Remote;   Fair  Judgement:  Poor  Insight:  Fair  Psychomotor Activity:  Normal  Concentration:  Poor  Recall:  NA  Fund of Knowledge:Good  Language: Good  Akathisia:  NA  Handed:  Right  AIMS (if indicated):     Assets:  Desire for Improvement  Sleep:      Musculoskeletal: Strength & Muscle Tone: within normal limits Gait & Station: normal Patient leans: N/A  Treatment Plan Summary:   Daily contact with patient to assess and evaluate symptoms and progress in treatment Medication management; admit to inpatient psychiatric facility for stabiization  Richard [name]  PMHNP-BC 01/22/2014 8:47 AM  I have personally seen the patient and agreed with the findings and involved in the treatment plan. Berniece Andreas, MD

## 2014-01-22 NOTE — Progress Notes (Signed)
  CARE MANAGEMENT ED NOTE 01/22/2014  Patient:  Richard [name],Richard [name]   Account Number:  [id number]  Date Initiated:  01/22/2014  Documentation initiated by:  Edd ArbourGIBBS,Doss Cybulski  Subjective/Objective Assessment:   43 yr old self [place] [facility] [place], [place] when [employer] arrived Marriage concerns Confirms no pcp     Subjective/Objective Assessment Detail:   has 5 ED visits and 2 admissions in last 6 months [facility] with d/c instructions to f/u with [facility] and [facility]  Pt last seen by ED CM on 12/11/13 but has not followed up on resources offered  Dx Major depression, recurrent, severe, borderline personaitydisorder PMH DM, HTN, DM neuropaty  Pt pleasant and smiling with interaction with CM sat up on side of bed Asked about when he was leaving and CM referred [facility]     Action/Plan:   ED Cm noted pt without pcp [facility] Spoke with pt Provided with resources below   Action/Plan Detail:   Anticipated DC Date:       Status Recommendation to Physician:   Result of Recommendation:    Other ED Services  Consult Working Plan    DC Planning Services  Other  PCP issues  Outpatient Services - Pt will follow up    Choice offered to / List presented to:            Status of service:  Completed, signed off  ED Comments:   ED Comments Detail:  CM discussed and provided written information for self pay pcps, importance of pcp for f/u care, www.needymeds.org, discounted pharmacies and other [place] resources such as financial assistance, [facility], [facility], DSS and health department  Reviewed resources for [place] self pay pcps like [facility], [facility] medicine [facility] [address], [facility], general medical clinics, [facility] urgent care plus others, [facility] out patient pharmacies and housing Pt voiced understanding and appreciation of resources provided  Provided [facility] contact information

## 2014-01-23 DIAGNOSIS — F3289 Other specified depressive episodes: Secondary | ICD-10-CM

## 2014-01-23 DIAGNOSIS — F329 Major depressive disorder, single episode, unspecified: Secondary | ICD-10-CM

## 2014-01-23 DIAGNOSIS — R45851 Suicidal ideations: Secondary | ICD-10-CM

## 2014-01-23 LAB — CBG MONITORING, ED
GLUCOSE-CAPILLARY: 79 mg/dL (ref 70–99)
GLUCOSE-CAPILLARY: 74 mg/dL (ref 70–99)

## 2014-01-23 NOTE — Consult Note (Signed)
  Psychiatric Specialty Exam: Physical Exam  ROS  Blood pressure 111/62, pulse 63, temperature 97.9 F (36.6 C), temperature source Oral, resp. rate 18, SpO2 100.00%.There is no weight on file to calculate BMI.  General Appearance: Fairly Groomed  Patent attorneyye Contact::  Good  Speech:  Clear and Coherent  Volume:  Normal  Mood:  Irritable  Affect:  Depressed  Thought Process:  Coherent  Orientation:  Full (Time, Place, and Person)  Thought Content:  Negative  Suicidal Thoughts:  Yes.  with intent/plan  Homicidal Thoughts:  No  Memory:  Immediate;   Good Recent;   Good Remote;   Good  Judgement:  Fair  Insight:  Shallow  Psychomotor Activity:  Normal  Concentration:  Good  Recall:  Good  Akathisia:  Negative  Handed:  Right  AIMS (if indicated):     Assets:  Communication Skills Desire for Improvement  Sleep:      [name] says he is still suicidal.  Nothing has changed, he says he has nothing to live for.  " I want to kill myself because of everything that's going on in my life". The plan remains to find him an inpatient bed to treatment of his depression and suicidal ideation.

## 2014-01-23 NOTE — Consult Note (Signed)
  Review of Systems  Constitutional: Negative.   HENT: Negative.   Eyes: Negative.   Respiratory: Negative.   Cardiovascular: Negative.   Gastrointestinal: Negative.   Genitourinary: Negative.   Musculoskeletal: Negative.   Skin: Negative.   Neurological: Negative.   Endo/Heme/Allergies: Negative.   Psychiatric/Behavioral: Positive for depression and suicidal ideas.    

## 2014-01-23 NOTE — ED Notes (Signed)
Patient denies SI, HI, AVH. Rates anxiety 5/10, depression 7/10. Patient grins and laughs when asked questions. Minimal interaction with Clinical research associatewriter.   Encouragement offered.   Q 15 safety checks continue.

## 2014-01-23 NOTE — ED Notes (Signed)
Chaplain consult for follow up support.  Familiar with patient from previous admissions.    Chaplain provided continued support around Plummer's relationship with spouse.  Aqib's spouse is in relationship with another man by whom she is pregnant.  She is currently residing in the apartment that she shared with Waylin.  Link Snufferddie is living with his inlaws.  His spouse visits this home with her new relationship.   Nathaniel expressed the grief of this relationship as overwhelming and exhausting, preventing him from moving forward with his life.  He has felt trapped, stating he is not able to conceptualize hope, and expresses fear around thoughts that he would like to die in order to not feel this.   Chaplain and Jaasiel normalized feelings around loss of relationship, provided some grief education and support.  Worked to identify elements of his life / personality which he still holds, identify boundaries with relationship, identify tensions in holding hope for some reconciliation alongside taking steps forward in his life.   Will continue to follow for support during admission.   Belva CromeStalnaker, Gearald Stonebraker Wayne MDiv

## 2014-01-23 NOTE — Progress Notes (Signed)
Pt received a list of community mental health resources from signer. Signer explained purpose of the resource to pt.   

## 2014-01-24 LAB — CBG MONITORING, ED
Glucose-Capillary: 84 mg/dL (ref 70–99)
Glucose-Capillary: 89 mg/dL (ref 70–99)

## 2014-01-24 NOTE — Progress Notes (Signed)
Pt was given the [facility] information. Pt stated, "Not all people who come here need resources like this, I was offended yesterday when I got this". Pt's feelings were acknowledged and encouraged to seek staff if he had any questions or concerns.

## 2014-01-24 NOTE — Consult Note (Signed)
  Psychiatric Specialty Exam: Physical Exam  ROS  Blood pressure 111/58, pulse 84, temperature 97.7 F (36.5 C), temperature source Oral, resp. rate 17, SpO2 100.00%.There is no weight on file to calculate BMI.  General Appearance: Fairly Groomed  Patent attorneyye Contact::  Good  Speech:  Clear and Coherent  Volume:  Normal  Mood:  Depressed  Affect:  Congruent  Thought Process:  Coherent  Orientation:  Full (Time, Place, and Person)  Thought Content:  Negative  Suicidal Thoughts:  Yes.  with intent/plan  Homicidal Thoughts:  No  Memory:  Immediate;   Good Recent;   Good Remote;   Good  Judgement:  Intact  Insight:  Shallow  Psychomotor Activity:  Normal  Concentration:  Good  Recall:  Good  Akathisia:  Negative  Handed:  Right  AIMS (if indicated):     Assets:  Communication Skills Desire for Improvement Housing Physical Health  Sleep:   adequate   [name] is still suicidal, nothing has changed since yesterday, he days.  Plan is to continue looking for an inpatient bed to treat his depression.

## 2014-01-24 NOTE — ED Notes (Signed)
Patient reports racing thoughts. Laughs and grins when answering questions. Reports SI, anxiety 7/10, depression 8/10. Denies HI, AVH.  Encouragement offered. Given Depakote, Trazodone.  Q 15 safety checks continue.

## 2014-01-24 NOTE — BH Assessment (Addendum)
[facility] Assessment Progress Note  [facility] Shift Report dated today indicates that [facility] pt on their wait list.  However I am unable to find a record of this in EPIC, and the shift report has no further details.  Several attempts were made to call [facility] to confirm, and a message was left; reply is pending at this time.  I also called [facility] and was told to send referral.  Numerous attempts have been made to fax referral without success.  Will keep trying.  Doylene Canninghomas Dajuan Turnley, MA Triage Specialist 01/24/2014 @ 18:19  Addendum:  At 18:36 Coy SaunasRosemary at [facility] confirms receipt of referral paperwork.  Doylene Canninghomas Skylinn Vialpando, MA Triage Specialist 01/24/2014 @ 18:38

## 2014-01-24 NOTE — ED Notes (Signed)
Continued support with pt around ED admission, grief, life changes, feelings of loneliness, hopelessness, depression, SI, lack of support.   Richard [name] states that if he is unable to find placement, he is thinking of asking for discharge from ED tomorrow, though he describes not feeling different / better.  Chaplain provided support around process of admission to outside hospitals, management of crisis, ways time in ED can feel more supportive for him.  If Richard [name] chooses to discharge from ED, chaplain spoke briefly with him about where he might find supportive relationships in his community.   Richard [name] currently feels as though he does not have a plan and this is distressing for him.  Stated it would be helpful to hear information about possible transfers more consistently.   Chaplain communicated this to staff.   Will follow up in AM for continued support.  Richard [name], Richard [name] MDiv

## 2014-01-24 NOTE — Progress Notes (Signed)
01/24/14 1451 ED [facility]

## 2014-01-25 ENCOUNTER — Encounter (HOSPITAL_COMMUNITY): Payer: Self-pay | Admitting: Registered Nurse

## 2014-01-25 LAB — CBG MONITORING, ED: Glucose-Capillary: 96 mg/dL (ref 70–99)

## 2014-01-25 NOTE — Progress Notes (Signed)
CSW faxed IVC paperwork to Magistrate. Per Carl BestMagistrate Walker, paperwork has been approved and accepted and officers have been dispatched.  Mariann LasterAlexandra Manon Banbury LCSWA,     ED CSW  phone: [phone] 1:45pm

## 2014-01-25 NOTE — ED Notes (Signed)
Pt up to the restroom

## 2014-01-25 NOTE — ED Notes (Signed)
Pt sitting up eating breakfast.

## 2014-01-25 NOTE — ED Notes (Signed)
Pt sitting up eating lunch

## 2014-01-25 NOTE — Consult Note (Signed)
HPI:  Patient continues to endorse suicidal thoughts and depression. Patient states that he is sleeping, eating well, and tolerating medications without adverse effects.    Axis I: Major Depression, Recurrent severe Axis II: Deferred Axis III:  Past Medical History  Diagnosis Date  . Diabetes mellitus   . Hypertension   . Diabetic neuropathy    Axis IV: other psychosocial or environmental problems Axis V: 11-20 some danger of hurting self or others possible OR occasionally fails to maintain minimal personal hygiene OR gross impairment in communication   Psychiatric Specialty Exam: Physical Exam  Review of Systems  Constitutional: Negative for weight loss, malaise/fatigue and diaphoresis.  HENT: Negative.   Respiratory: Negative.   Gastrointestinal: Negative for nausea, vomiting, abdominal pain and diarrhea.  Musculoskeletal: Negative.   Neurological: Negative.   Psychiatric/Behavioral: Positive for depression and suicidal ideas. Negative for hallucinations, memory loss and substance abuse. The patient is not nervous/anxious and does not have insomnia.     Blood pressure 118/69, pulse 76, temperature 97.9 F (36.6 C), temperature source Oral, resp. rate 18, SpO2 97.00%.There is no weight on file to calculate BMI.  General Appearance: Fairly Groomed  Patent attorneyye Contact::  Good  Speech:  Clear and Coherent  Volume:  Normal  Mood:  Depressed  Affect:  Congruent  Thought Process:  Coherent  Orientation:  Full (Time, Place, and Person)  Thought Content:  Negative  Suicidal Thoughts:  Yes.  with intent/plan  Homicidal Thoughts:  No  Memory:  Immediate;   Good Recent;   Good Remote;   Good  Judgement:  Intact  Insight:  Shallow  Psychomotor Activity:  Normal  Concentration:  Good  Recall:  Good  Akathisia:  Negative  Handed:  Right  AIMS (if indicated):     Assets:  Communication Skills Desire for Improvement Housing Physical Health  Sleep:   adequate    Plan:  Will  continue with current treatment plan for inpatient treatment for depression. [facility].   Shuvon B. Rankin FNP-BC

## 2014-01-25 NOTE — Consult Note (Signed)
Face to face evaluation and I agree with this note 

## 2014-01-25 NOTE — ED Notes (Signed)
Pt ambulating in hallway.

## 2014-01-25 NOTE — ED Notes (Signed)
Pt remains SI, plan to use gun.  Pt contracts for safety,  Will continue to monitor.

## 2014-01-25 NOTE — BH Assessment (Signed)
[facility] Assessment Progress Note  At 10:10 I called [facility] to follow up on yesterday's referral.  Anetha reports that pt has been declined for admission to their facility.  There is no documented reason for the decline.  Doylene Canninghomas Keneshia Tena, MA Triage Specialist 01/25/2014 @ 10:11

## 2014-01-25 NOTE — BH Assessment (Addendum)
[facility] Assessment Progress Note  At 18:05 I [facility] to [facility], who authorized pt for [facility], authorization #[id number] from 01/25/2014 - 01/31/2014.  Please note that authorization does not mean that pt has been accepted to the facility.  At 18:29 I spoke to [facility] at [facility], providing demographic information; she agreed to accept referral.  Paperwork was then faxed.  At 18:49 Roseanne RenoStewart @ [facility] confirmed receipt.  Doylene Canninghomas Tracer Gutridge, MA Triage Specialist 01/25/2014 @ 18:49

## 2014-01-26 LAB — CBG MONITORING, ED
GLUCOSE-CAPILLARY: 130 mg/dL — AB (ref 70–99)
GLUCOSE-CAPILLARY: 86 mg/dL (ref 70–99)

## 2014-02-06 ENCOUNTER — Emergency Department (HOSPITAL_COMMUNITY)
Admission: EM | Admit: 2014-02-06 | Discharge: 2014-02-06 | Disposition: A | Discharge status: Home / Self Care | Payer: No Typology Code available for payment source | Attending: Emergency Medicine | Admitting: Emergency Medicine

## 2014-02-06 ENCOUNTER — Encounter (HOSPITAL_COMMUNITY): Payer: Self-pay | Admitting: Emergency Medicine

## 2014-02-06 DIAGNOSIS — Z79899 Other long term (current) drug therapy: Secondary | ICD-10-CM | POA: Insufficient documentation

## 2014-02-06 DIAGNOSIS — E1149 Type 2 diabetes mellitus with other diabetic neurological complication: Secondary | ICD-10-CM | POA: Insufficient documentation

## 2014-02-06 DIAGNOSIS — K047 Periapical abscess without sinus: Secondary | ICD-10-CM | POA: Insufficient documentation

## 2014-02-06 DIAGNOSIS — E1142 Type 2 diabetes mellitus with diabetic polyneuropathy: Secondary | ICD-10-CM | POA: Insufficient documentation

## 2014-02-06 DIAGNOSIS — Z9889 Other specified postprocedural states: Secondary | ICD-10-CM | POA: Insufficient documentation

## 2014-02-06 DIAGNOSIS — H9209 Otalgia, unspecified ear: Secondary | ICD-10-CM | POA: Insufficient documentation

## 2014-02-06 DIAGNOSIS — K029 Dental caries, unspecified: Secondary | ICD-10-CM | POA: Insufficient documentation

## 2014-02-06 DIAGNOSIS — I10 Essential (primary) hypertension: Secondary | ICD-10-CM | POA: Insufficient documentation

## 2014-02-06 DIAGNOSIS — Z789 Other specified health status: Secondary | ICD-10-CM | POA: Insufficient documentation

## 2014-02-06 DIAGNOSIS — K006 Disturbances in tooth eruption: Secondary | ICD-10-CM | POA: Insufficient documentation

## 2014-02-06 DIAGNOSIS — K089 Disorder of teeth and supporting structures, unspecified: Secondary | ICD-10-CM | POA: Insufficient documentation

## 2014-02-06 DIAGNOSIS — R51 Headache: Secondary | ICD-10-CM | POA: Insufficient documentation

## 2014-02-06 DIAGNOSIS — F17200 Nicotine dependence, unspecified, uncomplicated: Secondary | ICD-10-CM | POA: Insufficient documentation

## 2014-02-06 MED ORDER — PENICILLIN V POTASSIUM 500 MG PO TABS: 500.0000 mg | ORAL_TABLET | Freq: Three times a day (TID) | ORAL | Status: DC

## 2014-02-06 MED ORDER — HYDROCODONE-ACETAMINOPHEN 5-325 MG PO TABS: ORAL_TABLET | ORAL | Status: DC

## 2014-02-06 MED ORDER — PENICILLIN V POTASSIUM 500 MG PO TABS
500.0000 mg | ORAL_TABLET | Freq: Once | ORAL | Status: AC
  Administered 2014-02-06: 500 mg via ORAL
  Filled 2014-02-06: qty 1

## 2014-02-06 NOTE — Discharge Instructions (Signed)
Please read and follow all provided instructions.  Your diagnoses today include:  1. Dental abscess     The exam and treatment you received today has been provided on an emergency basis only. This is not a substitute for complete medical or dental care.  Tests performed today include:  Vital signs. See below for your results today.   Medications prescribed:   Penicillin - antibiotic  You have been prescribed an antibiotic medicine: take the entire course of medicine even if you are feeling better. Stopping early can cause the antibiotic not to work.   Vicodin (hydrocodone/acetaminophen) - narcotic pain medication  DO NOT drive or perform any activities that require you to be awake and alert because this medicine can make you drowsy. BE VERY CAREFUL not to take multiple medicines containing Tylenol (also called acetaminophen). Doing so can lead to an overdose which can damage your liver and cause liver failure and possibly death.  Take any prescribed medications only as directed.  Home care instructions:  Follow any educational materials contained in this packet.  Follow-up instructions: Please follow-up with your dentist for further evaluation of your symptoms.   Dental Assistance: See below for dental referrals  Return instructions:   Please return to the Emergency Department if you experience worsening symptoms.  Please return if you develop a fever, you develop more swelling in your face or neck, you have trouble breathing or swallowing food.  Please return if you have any other emergent concerns.  Additional Information:  Your vital signs today were: BP 133/89   Pulse 98   Temp(Src) 99 F (37.2 C) (Oral)   Resp 18   SpO2 99% If your blood pressure (BP) was elevated above 135/85 this visit, please have this repeated by your doctor within one month. -------------- Dental Care: Organization         Address  Phone  Notes  [facility] Department of [facility]  [facility] [address] [place], [place] [phone] Accepts children up to age 54 who are enrolled in [place] or [place] Health Choice; pregnant women with a Medicaid card; and children who have applied for Medicaid or [place] Health Choice, but were declined, whose parents can pay a reduced fee at time of service.  [facility] Department of [facility]  [address], [place] [phone] Accepts children up to age 57 who are enrolled in [place] or [place] Health Choice; pregnant women with a Medicaid card; and children who have applied for Medicaid or [place] Health Choice, but were declined, whose parents can pay a reduced fee at time of service.  [facility]  [address] [place], [place] [phone] Patients are seen by appointment only. Walk-ins are not accepted. [facility] will see patients 63 years of age and older. Monday - Tuesday (8am-5pm) Most Wednesdays (8:30-5pm) $30 per visit, cash only  [facility]  [address], [facility] [phone] Patients are seen by appointment only. Walk-ins are not accepted. [facility] will see patients 46 years of age and older. One Wednesday Evening (Monthly: Volunteer Based).  $30 per visit, cash only  [employer]  [phone] for adults; Children under age 71, call Graduate Pediatric Dentistry at [phone]. Children aged 37-14, please call [phone] to request a pediatric application.  Dental services are provided in all areas of dental care including fillings, crowns and bridges, complete and partial  dentures, implants, gum treatment, root canals, and extractions. Preventive care is also provided. Treatment is provided to both adults and children. Patients are selected via a lottery and there is often a waiting list.   [facility] [address] [place]  [phone] [email or web address]   [facility] [address] [place], [place] [phone], Ext. [phone] Second and Fourth Thursday of each month, opens at 6:30 AM; Clinic ends at 9 AM.  Patients are seen on a first-come first-served basis, and a limited number are seen during each clinic.   [facility]  [address] [place], [place] [phone]   Eligibility Requirements You must have lived in [place], [place], or [place] counties for at least the last three months.   You cannot be eligible for state or federal sponsored [employer], including [employer], [place], or [employer].   You generally cannot be eligible for healthcare insurance through your employer.    How to apply: Eligibility screenings are held every Tuesday and Wednesday afternoon from 1:00 pm until 4:00 pm. You do not need an appointment for the interview!  [facility] [address] [place], [place]   [place]  [place]  [phone]

## 2014-02-06 NOTE — ED Provider Notes (Signed)
CSN: [id number]     Arrival date & time 02/06/14  1214 History   First MD Initiated Contact with Patient 02/06/14 1224    This chart was scribed for Renne Crigler, PA, with Shon Baton, MD by Tonye Royalty, ED Scribe. This patient was seen in room WTR7/WTR7 and the patient's care was started at 12:32 PM.   Chief Complaint  Patient presents with  . Dental Pain   The history is provided by the patient. No language interpreter was used.   HPI Comments: Richard [name] is a 43 y.o. male who presents to the Emergency Department complaining of dental pain and swelling on his right upper side with onset 4 days ago. He reports associated headache. He reports using Ibuprofen at home without remission of symptoms. He denies fever, chills, or nausea.   Past Medical History  Diagnosis Date  . Diabetes mellitus   . Hypertension   . Diabetic neuropathy    Past Surgical History  Procedure Laterality Date  . Mouth surgery    . Tympanostomy tube placement     History reviewed. No pertinent family history. History  Substance Use Topics  . Smoking status: Current Every Day Smoker  . Smokeless tobacco: Not on file  . Alcohol Use: No    Review of Systems  Constitutional: Negative for fever.  HENT: Positive for dental problem, ear pain and facial swelling. Negative for sore throat and trouble swallowing.   Respiratory: Negative for shortness of breath and stridor.   Musculoskeletal: Negative for neck pain.  Skin: Negative for color change.  Neurological: Negative for headaches.    Allergies  Review of patient's allergies indicates no known allergies.  Home Medications   Prior to Admission medications   Medication Sig Start Date End Date Taking? Authorizing Provider  divalproex (DEPAKOTE ER) 500 MG 24 hr tablet Take 1 tablet (500 mg total) by mouth 2 (two) times daily. 12/18/13   Beau Fanny, FNP  escitalopram (LEXAPRO) 10 MG tablet Take 1 tablet (10 mg total) by mouth daily. 12/18/13    Beau Fanny, FNP  lisinopril (PRINIVIL,ZESTRIL) 2.5 MG tablet Take 1 tablet (2.5 mg total) by mouth daily. For high blood pressure 12/18/13   Beau Fanny, FNP  metFORMIN (GLUCOPHAGE) 500 MG tablet Take 1 tablet (500 mg total) by mouth 2 (two) times daily with a meal. 12/18/13   Beau Fanny, FNP  traZODone (DESYREL) 50 MG tablet Take 1 tablet (50 mg total) by mouth at bedtime and may repeat dose one time if needed. 12/18/13   Everardo All Withrow, FNP   BP 133/89  Pulse 98  Temp(Src) 99 F (37.2 C) (Oral)  Resp 18  SpO2 99%  Physical Exam  Nursing note and vitals reviewed. Constitutional: He appears well-developed and well-nourished.  HENT:  Head: Normocephalic and atraumatic.  Right Ear: Tympanic membrane, external ear and ear canal normal.  Left Ear: Tympanic membrane, external ear and ear canal normal.  Nose: Nose normal.  Mouth/Throat: Uvula is midline, oropharynx is clear and moist and mucous membranes are normal. No trismus in the jaw. Abnormal dentition. Dental caries present. No dental abscesses or uvula swelling. No tonsillar abscesses.  Patient with R maxillary tooth pain and tenderness to palpation in area of molars. There is mild facial swelling and tenderness of gums. No discreet abscess. No drainage.   Eyes: Pupils are equal, round, and reactive to light.  Neck: Normal range of motion. Neck supple.  No neck swelling or Lugwig's angina  Neurological: He is alert.  Skin: Skin is warm and dry.  Psychiatric: He has a normal mood and affect.    ED Course  Procedures (including critical care time) Labs Review Labs Reviewed - No data to display  Imaging Review No results found.   EKG Interpretation None      DIAGNOSTIC STUDIES: Oxygen Saturation is 99% on room air, normal by my interpretation.    COORDINATION OF CARE: Patient seen and examined. Medications ordered.   Vital signs reviewed and are as follows: BP 133/89  Pulse 98  Temp(Src) 99 F (37.2 C) (Oral)   Resp 18  SpO2 99%  Patient counseled on use of narcotic pain medications. Counseled not to combine these medications with others containing tylenol. Urged not to drink alcohol, drive, or perform any other activities that requires focus while taking these medications. The patient verbalizes understanding and agrees with the plan.  Patient counseled to take prescribed medications as directed, return with worsening facial or neck swelling, and to follow-up with their dentist as soon as possible.     MDM   Final diagnoses:  Dental abscess   Patient with toothache. No fever. Exam unconcerning for Ludwig's angina or other deep tissue infection in neck.   As there is gum swelling, erythema, and facial swelling, will treat with antibiotic and pain medicine. Urged patient to follow-up with dentist. Small amount of pain medications -- pt recently seen at [facility] for SI. He does have dental infection.        Renne Crigler, PA-C 02/06/14 1247

## 2014-02-06 NOTE — ED Notes (Signed)
Per pt, dental pain and facial swelling for past week.

## 2014-02-06 NOTE — ED Provider Notes (Signed)
Medical screening examination/treatment/procedure(s) were performed by non-physician practitioner and as supervising physician I was immediately available for consultation/collaboration.   EKG Interpretation None        Courtney F Horton, MD 02/06/14 1850 

## 2014-02-10 ENCOUNTER — Encounter (HOSPITAL_COMMUNITY): Payer: Self-pay | Admitting: Emergency Medicine

## 2014-02-10 ENCOUNTER — Emergency Department (HOSPITAL_COMMUNITY)
Admission: EM | Admit: 2014-02-10 | Discharge: 2014-02-10 | Disposition: A | Discharge status: Home / Self Care | Payer: No Typology Code available for payment source | Attending: Emergency Medicine | Admitting: Emergency Medicine

## 2014-02-10 DIAGNOSIS — Z1159 Encounter for screening for other viral diseases: Secondary | ICD-10-CM | POA: Insufficient documentation

## 2014-02-10 DIAGNOSIS — Z7721 Contact with and (suspected) exposure to potentially hazardous body fluids: Secondary | ICD-10-CM | POA: Insufficient documentation

## 2014-02-10 DIAGNOSIS — Z792 Long term (current) use of antibiotics: Secondary | ICD-10-CM | POA: Insufficient documentation

## 2014-02-10 DIAGNOSIS — I10 Essential (primary) hypertension: Secondary | ICD-10-CM | POA: Insufficient documentation

## 2014-02-10 DIAGNOSIS — Z79899 Other long term (current) drug therapy: Secondary | ICD-10-CM | POA: Insufficient documentation

## 2014-02-10 DIAGNOSIS — E1149 Type 2 diabetes mellitus with other diabetic neurological complication: Secondary | ICD-10-CM | POA: Insufficient documentation

## 2014-02-10 DIAGNOSIS — IMO0002 Reserved for concepts with insufficient information to code with codable children: Secondary | ICD-10-CM

## 2014-02-10 DIAGNOSIS — E1142 Type 2 diabetes mellitus with diabetic polyneuropathy: Secondary | ICD-10-CM | POA: Insufficient documentation

## 2014-02-10 DIAGNOSIS — F17200 Nicotine dependence, unspecified, uncomplicated: Secondary | ICD-10-CM | POA: Insufficient documentation

## 2014-02-10 DIAGNOSIS — S61209A Unspecified open wound of unspecified finger without damage to nail, initial encounter: Secondary | ICD-10-CM | POA: Insufficient documentation

## 2014-02-10 LAB — HIV RAPID SCREEN (BLD OR BODY FLD EXPOSURE): Rapid HIV Screen: NONREACTIVE

## 2014-02-10 NOTE — ED Provider Notes (Signed)
Medical screening examination/treatment/procedure(s) were performed by non-physician practitioner and as supervising physician I was immediately available for consultation/collaboration.   EKG Interpretation None       Ethelda Chick, MD 02/10/14 2022
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
## 2014-02-10 NOTE — ED Notes (Signed)
Pt left with officer after lab draw. Pt nor officer asked staff prior to leaving. EDPA notified.

## 2014-02-11 LAB — HEPATITIS C ANTIBODY (REFLEX): HCV AB: NEGATIVE

## 2014-02-11 LAB — HEPATITIS B SURFACE ANTIGEN: Hepatitis B Surface Ag: NEGATIVE

## 2014-02-28 ENCOUNTER — Encounter (HOSPITAL_COMMUNITY): Payer: Self-pay | Admitting: Emergency Medicine

## 2014-02-28 ENCOUNTER — Emergency Department (HOSPITAL_COMMUNITY)
Admission: EM | Admit: 2014-02-28 | Discharge: 2014-03-06 | Disposition: A | Discharge status: Other Acute Inpatient Hospital | Payer: Self-pay | Attending: Emergency Medicine | Admitting: Emergency Medicine

## 2014-02-28 DIAGNOSIS — F332 Major depressive disorder, recurrent severe without psychotic features: Secondary | ICD-10-CM

## 2014-02-28 DIAGNOSIS — F322 Major depressive disorder, single episode, severe without psychotic features: Secondary | ICD-10-CM

## 2014-02-28 DIAGNOSIS — R45851 Suicidal ideations: Secondary | ICD-10-CM | POA: Insufficient documentation

## 2014-02-28 DIAGNOSIS — E119 Type 2 diabetes mellitus without complications: Secondary | ICD-10-CM | POA: Insufficient documentation

## 2014-02-28 DIAGNOSIS — I10 Essential (primary) hypertension: Secondary | ICD-10-CM | POA: Insufficient documentation

## 2014-02-28 DIAGNOSIS — F323 Major depressive disorder, single episode, severe with psychotic features: Secondary | ICD-10-CM

## 2014-02-28 DIAGNOSIS — F333 Major depressive disorder, recurrent, severe with psychotic symptoms: Secondary | ICD-10-CM

## 2014-02-28 DIAGNOSIS — F29 Unspecified psychosis not due to a substance or known physiological condition: Secondary | ICD-10-CM | POA: Insufficient documentation

## 2014-02-28 DIAGNOSIS — F17200 Nicotine dependence, unspecified, uncomplicated: Secondary | ICD-10-CM | POA: Insufficient documentation

## 2014-02-28 LAB — CBC
HCT: 44.1 % (ref 39.0–52.0)
Hemoglobin: 15.8 g/dL (ref 13.0–17.0)
MCH: 29.3 pg (ref 26.0–34.0)
MCHC: 35.8 g/dL (ref 30.0–36.0)
MCV: 81.7 fL (ref 78.0–100.0)
Platelets: 226 10*3/uL (ref 150–400)
RBC: 5.4 MIL/uL (ref 4.22–5.81)
RDW: 12.2 % (ref 11.5–15.5)
WBC: 4.2 10*3/uL (ref 4.0–10.5)

## 2014-02-28 LAB — RAPID URINE DRUG SCREEN, HOSP PERFORMED
Amphetamines: NOT DETECTED
Barbiturates: NOT DETECTED
Benzodiazepines: NOT DETECTED
Cocaine: NOT DETECTED
OPIATES: NOT DETECTED
Tetrahydrocannabinol: NOT DETECTED

## 2014-02-28 LAB — COMPREHENSIVE METABOLIC PANEL
ALBUMIN: 3.7 g/dL (ref 3.5–5.2)
ALT: 35 U/L (ref 0–53)
ANION GAP: 11 (ref 5–15)
AST: 22 U/L (ref 0–37)
Alkaline Phosphatase: 100 U/L (ref 39–117)
BUN: 8 mg/dL (ref 6–23)
CO2: 27 mEq/L (ref 19–32)
Calcium: 9.3 mg/dL (ref 8.4–10.5)
Chloride: 104 mEq/L (ref 96–112)
Creatinine, Ser: 0.9 mg/dL (ref 0.50–1.35)
GFR calc Af Amer: >90 mL/min (ref >90)
GFR calc non Af Amer: >90 mL/min (ref >90)
Glucose, Bld: 120 mg/dL — ABNORMAL HIGH (ref 70–99)
Potassium: 3.8 mEq/L (ref 3.7–5.3)
Sodium: 142 mEq/L (ref 137–147)
TOTAL PROTEIN: 7.8 g/dL (ref 6.0–8.3)
Total Bilirubin: 0.5 mg/dL (ref 0.3–1.2)

## 2014-02-28 LAB — VALPROIC ACID LEVEL

## 2014-02-28 LAB — SALICYLATE LEVEL

## 2014-02-28 LAB — ETHANOL

## 2014-02-28 LAB — ACETAMINOPHEN LEVEL: Acetaminophen (Tylenol), Serum: <15 ug/mL (ref 10–30)

## 2014-02-28 MED ORDER — METFORMIN HCL 500 MG PO TABS
500.0000 mg | ORAL_TABLET | Freq: Two times a day (BID) | ORAL | Status: DC
  Administered 2014-03-01 – 2014-03-06 (×11): 500 mg via ORAL
  Filled 2014-02-28 (×13): qty 1

## 2014-02-28 MED ORDER — HALOPERIDOL LACTATE 5 MG/ML IJ SOLN: 5.0000 mg | Freq: Four times a day (QID) | INTRAMUSCULAR | Status: DC | PRN

## 2014-02-28 MED ORDER — HALOPERIDOL LACTATE 5 MG/ML IJ SOLN: 5.0000 mg | INTRAMUSCULAR | Status: DC | PRN

## 2014-02-28 MED ORDER — TRAZODONE HCL 50 MG PO TABS
50.0000 mg | ORAL_TABLET | Freq: Two times a day (BID) | ORAL | Status: DC | PRN
  Administered 2014-02-28 – 2014-03-02 (×3): 50 mg via ORAL
  Filled 2014-02-28 (×3): qty 1

## 2014-02-28 MED ORDER — DIVALPROEX SODIUM ER 500 MG PO TB24
500.0000 mg | ORAL_TABLET | Freq: Two times a day (BID) | ORAL | Status: DC
  Administered 2014-02-28 – 2014-03-05 (×10): 500 mg via ORAL
  Filled 2014-02-28 (×13): qty 1

## 2014-02-28 MED ORDER — LISINOPRIL 2.5 MG PO TABS
2.5000 mg | ORAL_TABLET | Freq: Two times a day (BID) | ORAL | Status: DC
  Administered 2014-02-28 – 2014-03-06 (×11): 2.5 mg via ORAL
  Filled 2014-02-28 (×15): qty 1
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
## 2014-02-28 NOTE — ED Notes (Signed)
Feels like killing himself  For a couple of months

## 2014-02-28 NOTE — BH Assessment (Signed)
Relayed results of assessment to Donell Sievert, PA. Per Karleen Hampshire, [place] pt meets inpt criteria. There are no available beds at [facility]. TTS to seek placement.   EDP in agreement with recommendations. Informed RN of plan to seek placement. She will inform pt.   Clista Bernhardt, [facility] Triage Specialist 02/28/2014 10:15 PM
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
## 2014-03-01 LAB — CBG MONITORING, ED
GLUCOSE-CAPILLARY: 100 mg/dL — AB (ref 70–99)
Glucose-Capillary: 96 mg/dL (ref 70–99)

## 2014-03-01 NOTE — ED Provider Notes (Signed)
Pt transferred from [facility] ED for depression and SI.  Has been assessed by psych and is awaiting placement.  Results for orders placed during the hospital encounter of 02/28/14  CBC      Result Value Ref Range   WBC 4.2  4.0 - 10.5 K/uL   RBC 5.40  4.22 - 5.81 MIL/uL   Hemoglobin 15.8  13.0 - 17.0 g/dL   HCT 16.1  09.6 - 04.5 %   MCV 81.7  78.0 - 100.0 fL   MCH 29.3  26.0 - 34.0 pg   MCHC 35.8  30.0 - 36.0 g/dL   RDW 40.9  81.1 - 91.4 %   Platelets 226  150 - 400 K/uL  COMPREHENSIVE METABOLIC PANEL      Result Value Ref Range   Sodium 142  137 - 147 mEq/L   Potassium 3.8  3.7 - 5.3 mEq/L   Chloride 104  96 - 112 mEq/L   CO2 27  19 - 32 mEq/L   Glucose, Bld 120 (*) 70 - 99 mg/dL   BUN 8  6 - 23 mg/dL   Creatinine, Ser 7.82  0.50 - 1.35 mg/dL   Calcium 9.3  8.4 - 95.6 mg/dL   Total Protein 7.8  6.0 - 8.3 g/dL   Albumin 3.7  3.5 - 5.2 g/dL   AST 22  0 - 37 U/L   ALT 35  0 - 53 U/L   Alkaline Phosphatase 100  39 - 117 U/L   Total Bilirubin 0.5  0.3 - 1.2 mg/dL   GFR calc non Af Amer >90  >90 mL/min   GFR calc Af Amer >90  >90 mL/min   Anion gap 11  5 - 15  ETHANOL      Result Value Ref Range   Alcohol, Ethyl (B) <11  0 - 11 mg/dL  ACETAMINOPHEN LEVEL      Result Value Ref Range   Acetaminophen (Tylenol), Serum <15.0  10 - 30 ug/mL  SALICYLATE LEVEL      Result Value Ref Range   Salicylate Lvl <2.0 (*) 2.8 - 20.0 mg/dL  URINE RAPID DRUG SCREEN (HOSP PERFORMED)      Result Value Ref Range   Opiates NONE DETECTED  NONE DETECTED   Cocaine NONE DETECTED  NONE DETECTED   Benzodiazepines NONE DETECTED  NONE DETECTED   Amphetamines NONE DETECTED  NONE DETECTED   Tetrahydrocannabinol NONE DETECTED  NONE DETECTED   Barbiturates NONE DETECTED  NONE DETECTED  VALPROIC ACID LEVEL      Result Value Ref Range   Valproic Acid Lvl <10.0 (*) 50.0 - 100.0 ug/mL   No results found.    Rolan Bucco, MD 03/01/14 [phone]

## 2014-03-01 NOTE — ED Notes (Signed)
Pt requesting bible--Chaplain notified

## 2014-03-01 NOTE — Progress Notes (Signed)
  CARE MANAGEMENT ED NOTE 03/01/2014  Patient:  Richard [name],Richard [name]   Account Number:  [id number]  Date Initiated:  03/01/2014  Documentation initiated by:  Radford Pax  Subjective/Objective Assessment:   Patient presents to Ed with SI, HI     Subjective/Objective Assessment Detail:   Patient with pmhx of DM, and HTN.  Patient reports loss of employment, wife and cannot see his children     Action/Plan:   Recommended acute psychiatric hospitalization for safety monitoring, and crisis management per psychiatry   Action/Plan Detail:   Anticipated DC Date:       Status Recommendation to Physician:   Result of Recommendation:    Other ED Services  Consult Working Plan    DC Planning Services  Other  PCP issues    Choice offered to / List presented to:            Status of service:  Completed, signed off  ED Comments:   ED Comments Detail:  EDCM spoke to patient at bedside.  Patient confirms he does not have a pcp or insurance.  Patient reports he is not homeless.  [facility] provided patient with pamphlet to [facility]. [facility] providedpatient with list of pcps who accept self pay patients, list of discounted pharmacies and websites needymeds.org and [employer]co[employer]za for medication assistance, list of financial assistance in the community sucha as local churches and [employer], urban ministries and list of dentists for uninsured patients.  Patient thankful for assistance.  no further EDCM needs at this time.

## 2014-03-01 NOTE — ED Notes (Signed)
Pt depressed during interaction--shows good insight about his situation, explains that he has had "hard times for about the last year", "lost job", "wife cheated and had baby with another man", "can't see my kids", "all that devastated me"--pt states that he has hard time distinguishing between voices and his own thoughts--states I just have "negative things constantly in my head"--endorses SI and HI, command voices telling him to hurt self and others, no plan--pt is calm and resting at this time, states that "I know I need help, I want help"--discussed with pt the importance of going to follow up appointments after discharge, pt explained that last time he was here that he thought he didn't need it when he left.

## 2014-03-01 NOTE — ED Provider Notes (Signed)
Medical screening examination/treatment/procedure(s) were performed by non-physician practitioner and as supervising physician I was immediately available for consultation/collaboration.    Linwood Dibbles, MD 03/01/14 [phone]

## 2014-03-01 NOTE — Progress Notes (Signed)
Pt has been assessed and recommendation is for inpatient treatment. Pt.'s clinicals faxed out to:   [facility]   Will continue to pursue placement.    Derrell Lolling, MSW  Clinical Social Worker  [phone]

## 2014-03-01 NOTE — ED Notes (Signed)
[facility] called to transport pt to [facility] ed, spoke to april
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
## 2014-03-01 NOTE — ED Provider Notes (Signed)
Patient eating breakfast this morning without any complaints. Over problems according to nursing staff. Continue to pursue psychiatric placement.  Filed Vitals:   03/01/14 0511  BP: 112/58  Pulse: 58  Temp: 98.6 F (37 C)  Resp: 19     Gilda Crease, MD 03/01/14 [phone]

## 2014-03-01 NOTE — ED Notes (Signed)
Pt remains SI and HI, contracts for safety, will continue to monitor for safety.

## 2014-03-02 DIAGNOSIS — R45851 Suicidal ideations: Secondary | ICD-10-CM

## 2014-03-02 DIAGNOSIS — F333 Major depressive disorder, recurrent, severe with psychotic symptoms: Secondary | ICD-10-CM

## 2014-03-02 DIAGNOSIS — R45850 Homicidal ideations: Secondary | ICD-10-CM

## 2014-03-02 DIAGNOSIS — R6250 Unspecified lack of expected normal physiological development in childhood: Secondary | ICD-10-CM

## 2014-03-02 LAB — CBG MONITORING, ED
GLUCOSE-CAPILLARY: 96 mg/dL (ref 70–99)
Glucose-Capillary: 104 mg/dL — ABNORMAL HIGH (ref 70–99)
Glucose-Capillary: 96 mg/dL (ref 70–99)

## 2014-03-02 MED ORDER — RISPERIDONE 1 MG PO TABS
1.0000 mg | ORAL_TABLET | Freq: Every day | ORAL | Status: DC
  Administered 2014-03-03 – 2014-03-04 (×2): 1 mg via ORAL
  Filled 2014-03-02 (×3): qty 1

## 2014-03-02 MED ORDER — BENZTROPINE MESYLATE 1 MG PO TABS
0.5000 mg | ORAL_TABLET | Freq: Every day | ORAL | Status: DC
  Administered 2014-03-02 – 2014-03-04 (×3): 0.5 mg via ORAL
  Filled 2014-03-02 (×3): qty 1

## 2014-03-02 NOTE — ED Notes (Signed)
Pt remains SI & HI, pt contracts for safety, will continue to monitor for safety.

## 2014-03-02 NOTE — Progress Notes (Signed)
SW continues to seek placement for pt. Declined at [facility] and [facility].  At capacity: [facility], [facility], [facility] and [facility].  Pt has been faxed out to [facility].   Derrell Lolling, MSW Clinical Social Worker [phone]

## 2014-03-02 NOTE — ED Notes (Signed)
PT CBG RESULTS 96
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
## 2014-03-03 LAB — CBG MONITORING, ED
GLUCOSE-CAPILLARY: 84 mg/dL (ref 70–99)
GLUCOSE-CAPILLARY: 84 mg/dL (ref 70–99)
Glucose-Capillary: 105 mg/dL — ABNORMAL HIGH (ref 70–99)

## 2014-03-03 LAB — VALPROIC ACID LEVEL: Valproic Acid Lvl: 55 ug/mL (ref 50.0–100.0)

## 2014-03-03 MED ORDER — RISPERIDONE MICROSPHERES 25 MG IM SUSR
25.0000 mg | INTRAMUSCULAR | Status: DC
  Administered 2014-03-04: 25 mg via INTRAMUSCULAR
  Filled 2014-03-03: qty 2

## 2014-03-03 NOTE — Progress Notes (Signed)
Patient ID: Richard [name], male   DOB: [birth year], 43 y.o.   MRN: [id number] Kamare [id number]  SUBJECTIVE:  Patient, AA male 43 years old, with known hx of depression  and possible Bipolar depressed was seen today on rounds.  This is one of several visits to the ER for this patient  for the same reason.  He states he is suicidal because his wife of 17 years left him for another man.  Patient states he is having difficulty dealing with this and want to kill himself.  He is non compliant with medication and treatment regimen.  Patient does not take his medications as soon as he is discharged home and does not follow up with outpatient providers.  Patient is unable to contract for safety and is waiting for available bed at [facility].  Patient will be given Risperdal Consta 25 mg IM today since he is non compliant with his medications.  Mental Status Examination Psychiatric Specialty Exam: Physical Exam  ROS  Blood pressure 111/77, pulse 76, temperature 98.2 F (36.8 C), temperature source Oral, resp. rate 16, SpO2 100.00%.There is no weight on file to calculate BMI.  General Appearance: Casual  Eye Contact::  Good  Speech:  Clear and Coherent and Normal Rate  Volume:  Normal  Mood:  Anxious and Depressed  Affect:  Congruent  Thought Process:  Coherent, Goal Directed and Intact  Orientation:  Full (Time, Place, and Person)  Thought Content:  WDL  Suicidal Thoughts:  Yes.  with intent/plan  Homicidal Thoughts:  No  Memory:  Immediate;   Good Recent;   Good Remote;   Good  Judgement:  Poor  Insight:  Lacking  Psychomotor Activity:  Normal  Concentration:  Good  Recall:  NA  Akathisia:  NA  Handed:  Right  AIMS (if indicated):     Assets:  Desire for Improvement  Sleep:          Current MedicationCurrent facility-administered medications:benztropine (COGENTIN) tablet 0.5 mg, 0.5 mg, Oral, QHS, Mojeed Akintayo, 0.5 mg at 03/02/14 2123;  divalproex (DEPAKOTE ER) 24 hr tablet 500 mg,  500 mg, Oral, BID, Linwood Dibbles, MD, 500 mg at 03/03/14 1022;  haloperidol lactate (HALDOL) injection 5 mg, 5 mg, Intramuscular, Q6H PRN, Monte Fantasia, PA-C;  lisinopril (PRINIVIL,ZESTRIL) tablet 2.5 mg, 2.5 mg, Oral, BID, Linwood Dibbles, MD, 2.5 mg at 03/03/14 1021 metFORMIN (GLUCOPHAGE) tablet 500 mg, 500 mg, Oral, BID WC, Linwood Dibbles, MD, 500 mg at 03/03/14 0916;  risperiDONE (RISPERDAL) tablet 1 mg, 1 mg, Oral, QHS, Mojeed Akintayo;  risperiDONE microspheres (RISPERDAL CONSTA) injection 25 mg, 25 mg, Intramuscular, Q14 Days, Earney Navy, NP;  traZODone (DESYREL) tablet 50 mg, 50 mg, Oral, BID PRN, Linwood Dibbles, MD, 50 mg at 03/02/14 2123 Current outpatient prescriptions:divalproex (DEPAKOTE ER) 500 MG 24 hr tablet, Take 1 tablet (500 mg total) by mouth 2 (two) times daily., Disp: 60 tablet, Rfl: 0;  lisinopril (PRINIVIL,ZESTRIL) 2.5 MG tablet, Take 2.5 mg by mouth 2 (two) times daily., Disp: , Rfl: ;  metFORMIN (GLUCOPHAGE) 500 MG tablet, Take 1 tablet (500 mg total) by mouth 2 (two) times daily with a meal., Disp: 60 tablet, Rfl: 0 traZODone (DESYREL) 50 MG tablet, Take 50 mg by mouth 2 (two) times daily as needed for sleep. Take 50 mg by mouth at bedtime as needed for sleep. May take an addition 50 mg if needed., Disp: , Rfl:    Assessment Axis I Major depressive disorder, recurrent , severe, Suicidal ideation  Plan Obtain Depakote level today Administer Risperdal Consta 25 mg IM  Continue with administering the rest of his medications. Will be re-evaluated in am by Dr Lolly Mustache for appropriate disposition Continue to wait for available bed at [facility]  I have personally seen the patient and agreed with the findings and involved in the treatment plan. Kathryne Sharper, MD

## 2014-03-03 NOTE — ED Notes (Signed)
Offered Riserdal Consta injection. Explanation and encouragement provided. Stated he would think about it. I told him I would check back with him alittle later.

## 2014-03-03 NOTE — ED Notes (Signed)
Pt refused 1200 vitals

## 2014-03-03 NOTE — ED Notes (Signed)
Report received from Lizbeth Bark RN. Pt. Alert and oriented in no distress denies VH and pain. Pt. States he still has SI to shoot himself and HI but declines to name them. Will continue to monitor for safety. Pt. Instructed to come to me with problems or concerns. Q 15 minute checks continue.

## 2014-03-03 NOTE — ED Notes (Signed)
Lab tech here to draw blood for depakote level. After much encouragement and coaxing patient was cooperative and collection successful.

## 2014-03-03 NOTE — BH Assessment (Signed)
Pt declined at [facility] by Nanine Means, NP. Contacted the following facilities for placement:  INFORMATION HAS BEEN FAXED AND PT IS UNDER REVIEW: [facility]  AT CAPACITY: [facility], per [facility], per [facility], per [facility], per [facility], per [facility], per [facility], per [facility], per [facility], per [facility], per [facility], per Peggy  MULTIPLE CALL WITH NO RESPONSE: [facility]  PT DECLINED: [employer] (no [place] sponsorship) [facility] Orson Eva, [place], [facility] Triage Specialist [phone]

## 2014-03-04 DIAGNOSIS — F332 Major depressive disorder, recurrent severe without psychotic features: Secondary | ICD-10-CM

## 2014-03-04 LAB — CBG MONITORING, ED
GLUCOSE-CAPILLARY: 81 mg/dL (ref 70–99)
GLUCOSE-CAPILLARY: 83 mg/dL (ref 70–99)
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
## 2014-03-04 NOTE — ED Notes (Signed)
Pt agreed to take the risperidol consta after talking w/ the dr.  Tresa Res  now VORB Dr Lolly Mustache

## 2014-03-04 NOTE — ED Notes (Signed)
Dr arfeen and shuvon np into see 

## 2014-03-04 NOTE — ED Notes (Signed)
Patient presents calm and cooperative states that he is still having self harm thoughts but is able to contract for safety, denies HI/AVH. NAD

## 2014-03-04 NOTE — ED Notes (Signed)
Up to the bathroom 

## 2014-03-04 NOTE — Progress Notes (Signed)
Per Corrie Dandy at [facility], pt remains on [facility] waitlist.  Blain Pais, MHT/NS

## 2014-03-04 NOTE — ED Notes (Signed)
Up in the hall. 

## 2014-03-04 NOTE — ED Notes (Signed)
Pt tolerated injection w/o difficulty 
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
## 2014-03-05 ENCOUNTER — Encounter (HOSPITAL_COMMUNITY): Payer: Self-pay | Admitting: Psychiatry

## 2014-03-05 MED ORDER — DIVALPROEX SODIUM ER 500 MG PO TB24
500.0000 mg | ORAL_TABLET | Freq: Three times a day (TID) | ORAL | Status: DC
  Administered 2014-03-05 – 2014-03-06 (×2): 500 mg via ORAL
  Filled 2014-03-05 (×5): qty 1

## 2014-03-05 NOTE — ED Notes (Signed)
Patient is pacing the halls 

## 2014-03-05 NOTE — ED Notes (Signed)
Patient was standing on chair trying to cover up camera

## 2014-03-05 NOTE — ED Notes (Signed)
When writer went in to get vitals for the night time, patient was observed standing in the chair messing with the cameras. I asked was he ok but instructed him that he could not stand in the chair for his safety. Patient is refusing his vital signs he said he does not need them taken

## 2014-03-05 NOTE — ED Notes (Signed)
Pt refusing night time meds at this time.
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
## 2014-03-05 NOTE — ED Notes (Signed)
Report received from [facility]. Pt. Alert and oriented in no distress denies  HI, and pain. Pt. States he is still having SI to hand himself. Will continue to monitor for safety. Pt. Instructed to come to me with problems or concerns. Q 15 minute checks continue.

## 2014-03-05 NOTE — ED Notes (Signed)
Patient pacing the halls. He asked Clinical research associate why is everyone leaving and he not? He wants to know when will he be getting placed? I explained to him that they are looking for placement for him. He is constantly looking out of the [facility] doors

## 2014-03-06 LAB — CBG MONITORING, ED: Glucose-Capillary: 89 mg/dL (ref 70–99)

## 2014-03-06 MED ORDER — RISPERIDONE 1 MG PO TABS: 1.0000 mg | ORAL_TABLET | Freq: Every day | ORAL | Status: DC

## 2014-03-06 MED ORDER — DIVALPROEX SODIUM ER 500 MG PO TB24: 500.0000 mg | ORAL_TABLET | Freq: Two times a day (BID) | ORAL | Status: DC

## 2014-03-06 MED ORDER — RISPERIDONE MICROSPHERES 25 MG IM SUSR: 25.0000 mg | INTRAMUSCULAR | Status: DC

## 2014-03-06 MED ORDER — TRAZODONE HCL 50 MG PO TABS: 50.0000 mg | ORAL_TABLET | Freq: Every day | ORAL | Status: DC

## 2014-03-06 MED ORDER — BENZTROPINE MESYLATE 0.5 MG PO TABS: 0.5000 mg | ORAL_TABLET | Freq: Two times a day (BID) | ORAL | Status: DC

## 2014-03-06 MED ORDER — DIVALPROEX SODIUM ER 500 MG PO TB24: 500.0000 mg | ORAL_TABLET | Freq: Three times a day (TID) | ORAL | Status: DC

## 2014-03-06 NOTE — ED Notes (Signed)
Pt will be d/c today and will f/u with [facility]

## 2014-03-06 NOTE — BH Assessment (Signed)
Discharge home per Dr. Ladona Ridgelaylor and Assunta FoundShuvon Rankin, NP. Writer provided patient with a list of outpatient referrals prior to discharge home.

## 2014-03-06 NOTE — Discharge Instructions (Signed)
Depression Depression is feeling sad, low, down in the dumps, blue, gloomy, or empty. In general, there are two kinds of depression:  Normal sadness or grief. This can happen after something upsetting. It often goes away on its own within 2 weeks. After losing a loved one (bereavement), normal sadness and grief may last longer than two weeks. It usually gets better with time.  Clinical depression. This kind lasts longer than normal sadness or grief. It keeps you from doing the things you normally do in life. It is often hard to function at home, work, or at school. It may affect your relationships with others. Treatment is often needed. GET HELP RIGHT AWAY IF:  You have thoughts about hurting yourself or others.  You lose touch with reality (psychotic symptoms). You may:  See or hear things that are not real.  Have untrue beliefs about your life or people around you.  Your medicine is giving you problems. MAKE SURE YOU:  Understand these instructions.  Will watch your condition.  Will get help right away if you are not doing well or get worse. Document Released: 06/27/2010 Document Revised: 10/09/2013 Document Reviewed: 09/24/2011 [facility] Patient Information 2015 [employer], [place]. This information is not intended to replace advice given to you by your health care provider. Make sure you discuss any questions you have with your health care provider.  Stress Stress-related medical problems are becoming increasingly common. The body has a built-in physical response to stressful situations. Faced with pressure, challenge or danger, we need to react quickly. Our bodies release hormones such as cortisol and adrenaline to help do this. These hormones are part of the "fight or flight" response and affect the metabolic rate, heart rate and blood pressure, resulting in a heightened, stressed state that prepares the body for optimum performance in dealing with a stressful situation. It is likely  that early man required these mechanisms to stay alive, but usually modern stresses do not call for this, and the same hormones released in today's world can damage health and reduce coping ability. CAUSES  Pressure to perform at work, at school or in sports.  Threats of physical violence.  Money worries.  Arguments.  Family conflicts.  Divorce or separation from significant other.  Bereavement.  New job or unemployment.  Changes in location.  Alcohol or drug abuse. SOMETIMES, THERE IS NO PARTICULAR REASON FOR DEVELOPING STRESS. Almost all people are at risk of being stressed at some time in their lives. It is important to know that some stress is temporary and some is long term.  Temporary stress will go away when a situation is resolved. Most people can cope with short periods of stress, and it can often be relieved by relaxing, taking a walk or getting any type of exercise, chatting through issues with friends, or having a good night's sleep.  Chronic (long-term, continuous) stress is much harder to deal with. It can be psychologically and emotionally damaging. It can be harmful both for an individual and for friends and family. SYMPTOMS Everyone reacts to stress differently. There are some common effects that help us recognize it. In times of extreme stress, people may:  Shake uncontrollably.  Breathe faster and deeper than normal (hyperventilate).  Vomit.  For people with asthma, stress can trigger an attack.  For some people, stress may trigger migraine headaches, ulcers, and body pain. PHYSICAL EFFECTS OF STRESS MAY INCLUDE:  Loss of energy.  Skin problems.  Aches and pains resulting from tense muscles, including  neck ache, backache and tension headaches.  Increased pain from arthritis and other conditions.  Irregular heart beat (palpitations).  Periods of irritability or anger.  Apathy or depression.  Anxiety (feeling uptight or worrying).  Unusual  behavior.  Loss of appetite.  Comfort eating.  Lack of concentration.  Loss of, or decreased, sex-drive.  Increased smoking, drinking, or recreational drug use.  For women, missed periods.  Ulcers, joint pain, and muscle pain. Post-traumatic stress is the stress caused by any serious accident, strong emotional damage, or extremely difficult or violent experience such as rape or war. Post-traumatic stress victims can experience mixtures of emotions such as fear, shame, depression, guilt or anger. It may include recurrent memories or images that may be haunting. These feelings can last for weeks, months or even years after the traumatic event that triggered them. Specialized treatment, possibly with medicines and psychological therapies, is available. If stress is causing physical symptoms, severe distress or making it difficult for you to function as normal, it is worth seeing your caregiver. It is important to remember that although stress is a usual part of life, extreme or prolonged stress can lead to other illnesses that will need treatment. It is better to visit a doctor sooner rather than later. Stress has been linked to the development of high blood pressure and heart disease, as well as insomnia and depression. There is no diagnostic test for stress since everyone reacts to it differently. But a caregiver will be able to spot the physical symptoms, such as:  Headaches.  Shingles.  Ulcers. Emotional distress such as intense worry, low mood or irritability should be detected when the doctor asks pertinent questions to identify any underlying problems that might be the cause. In case there are physical reasons for the symptoms, the doctor may also want to do some tests to exclude certain conditions. If you feel that you are suffering from stress, try to identify the aspects of your life that are causing it. Sometimes you may not be able to change or avoid them, but even a small change  can have a positive ripple effect. A simple lifestyle change can make all the difference. STRATEGIES THAT CAN HELP DEAL WITH STRESS:  Delegating or sharing responsibilities.  Avoiding confrontations.  Learning to be more assertive.  Regular exercise.  Avoid using alcohol or street drugs to cope.  Eating a healthy, balanced diet, rich in fruit and vegetables and proteins.  Finding humor or absurdity in stressful situations.  Never taking on more than you know you can handle comfortably.  Organizing your time better to get as much done as possible.  Talking to friends or family and sharing your thoughts and fears.  Listening to music or relaxation tapes.  Relaxation techniques like deep breathing, meditation, and yoga.  Tensing and then relaxing your muscles, starting at the toes and working up to the head and neck. If you think that you would benefit from help, either in identifying the things that are causing your stress or in learning techniques to help you relax, see a caregiver who is capable of helping you with this. Rather than relying on medications, it is usually better to try and identify the things in your life that are causing stress and try to deal with them. There are many techniques of managing stress including counseling, psychotherapy, aromatherapy, yoga, and exercise. Your caregiver can help you determine what is best for you. Document Released: 08/15/2002 Document Revised: 05/30/2013 Document Reviewed: 07/12/2007 ExitCare Patient Information  2015 [employer]. This information is not intended to replace advice given to you by your health care provider. Make sure you discuss any questions you have with your health care provider. ° °

## 2014-03-06 NOTE — [facility] Suicide Risk Assessment (Signed)
   Demographic Factors:  Male, Low socioeconomic status and Unemployed  Total Time spent with patient: 20 minutes  Psychiatric Specialty Exam: Physical Exam  Psychiatric: He has a normal mood and affect. His speech is normal and behavior is normal. Judgment and thought content normal. Cognition and memory are normal.    Review of Systems  Constitutional: Negative.   HENT: Negative.   Eyes: Negative.   Respiratory: Negative.   Cardiovascular: Negative.   Gastrointestinal: Negative.   Genitourinary: Negative.   Musculoskeletal: Negative.   Skin: Negative.   Neurological: Negative.   Endo/Heme/Allergies: Negative.   Psychiatric/Behavioral: Negative.     Blood pressure 127/64, pulse 64, temperature 98.5 F (36.9 C), temperature source Oral, resp. rate 18, SpO2 99.00%.There is no weight on file to calculate BMI.  General Appearance: Casual  Eye Contact::  Good  Speech:  Clear and Coherent  Volume:  Normal  Mood:  Euthymic  Affect:  Appropriate  Thought Process:  Negative  Orientation:  Full (Time, Place, and Person)  Thought Content:  Negative  Suicidal Thoughts:  No  Homicidal Thoughts:  No  Memory:  Immediate;   Fair Recent;   Fair Remote;   Fair  Judgement:  Fair  Insight:  Shallow  Psychomotor Activity:  Normal  Concentration:  Fair  Recall:  [employer]Fair  Fund of Knowledge:Fair  Language: Good  Akathisia:  No  Handed:  Right  AIMS (if indicated):     Assets:  Communication Skills Desire for Improvement Physical Health  Sleep:   good    Musculoskeletal: Strength & Muscle Tone: within normal limits Gait & Station: normal Patient leans: N/A   Mental Status Per Nursing Assessment::   On Admission:     Current Mental Status by Physician: patient denies suicidal ideation, intent or plan  Loss Factors: Financial problems/change in socioeconomic status  Historical Factors: NA  Risk Reduction Factors:   Positive social support  Continued Clinical Symptoms:   Resolving depression and psychosis  Cognitive Features That Contribute To Risk:  Closed-mindedness Polarized thinking    Suicide Risk:  Minimal: No identifiable suicidal ideation.  Patients presenting with no risk factors but with morbid ruminations; may be classified as minimal risk based on the severity of the depressive symptoms  Discharge Diagnoses:   AXIS I:  Major depressive disorder, recurrent episode, severe, specified as with psychotic behavior  AXIS II:  Cluster B Traits AXIS III:   Past Medical History  Diagnosis Date  . Diabetes mellitus   . Hypertension   . Diabetic neuropathy    AXIS IV:  other psychosocial or environmental problems and problems related to social environment AXIS V:  61-70 mild symptoms  Plan Of Care/Follow-up recommendations:  Activity:  as tolerated Diet:  healthy  Is patient on multiple antipsychotic therapies at discharge:  No   Has Patient had three or more failed trials of antipsychotic monotherapy by history:  No  Recommended Plan for Multiple Antipsychotic Therapies: NA    Thedore MinsAkintayo, Maritta Kief, MD 03/06/2014, 10:36 AM

## 2014-03-06 NOTE — ED Notes (Signed)
Rounded with MD and patient denies SI/HI
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
## 2014-03-06 NOTE — ED Notes (Signed)
Pt denies HI, ut sts he still is having suicidial thoughts, denies AH/VH

## 2014-05-27 ENCOUNTER — Emergency Department (HOSPITAL_COMMUNITY)
Admission: EM | Admit: 2014-05-27 | Discharge: 2014-05-28 | Disposition: A | Discharge status: Other Acute Inpatient Hospital | Payer: Self-pay | Attending: Emergency Medicine | Admitting: Emergency Medicine

## 2014-05-27 DIAGNOSIS — I10 Essential (primary) hypertension: Secondary | ICD-10-CM | POA: Insufficient documentation

## 2014-05-27 DIAGNOSIS — F332 Major depressive disorder, recurrent severe without psychotic features: Secondary | ICD-10-CM

## 2014-05-27 DIAGNOSIS — R4689 Other symptoms and signs involving appearance and behavior: Secondary | ICD-10-CM

## 2014-05-27 DIAGNOSIS — R45850 Homicidal ideations: Secondary | ICD-10-CM

## 2014-05-27 DIAGNOSIS — Z79899 Other long term (current) drug therapy: Secondary | ICD-10-CM | POA: Insufficient documentation

## 2014-05-27 DIAGNOSIS — R45851 Suicidal ideations: Secondary | ICD-10-CM

## 2014-05-27 DIAGNOSIS — Z720 Tobacco use: Secondary | ICD-10-CM | POA: Insufficient documentation

## 2014-05-27 DIAGNOSIS — E1140 Type 2 diabetes mellitus with diabetic neuropathy, unspecified: Secondary | ICD-10-CM | POA: Insufficient documentation

## 2014-05-27 DIAGNOSIS — F333 Major depressive disorder, recurrent, severe with psychotic symptoms: Secondary | ICD-10-CM | POA: Diagnosis present

## 2014-05-27 DIAGNOSIS — R4589 Other symptoms and signs involving emotional state: Secondary | ICD-10-CM

## 2014-05-27 LAB — COMPREHENSIVE METABOLIC PANEL
ALT: 23 U/L (ref 0–53)
AST: 22 U/L (ref 0–37)
Albumin: 4.3 g/dL (ref 3.5–5.2)
Alkaline Phosphatase: 100 U/L (ref 39–117)
Anion gap: 13 (ref 5–15)
BUN: 9 mg/dL (ref 6–23)
CALCIUM: 10.3 mg/dL (ref 8.4–10.5)
CHLORIDE: 102 meq/L (ref 96–112)
CO2: 26 mEq/L (ref 19–32)
CREATININE: 0.9 mg/dL (ref 0.50–1.35)
GFR calc Af Amer: >90 mL/min (ref >90)
GFR calc non Af Amer: >90 mL/min (ref >90)
Glucose, Bld: 103 mg/dL — ABNORMAL HIGH (ref 70–99)
Potassium: 3.9 mEq/L (ref 3.7–5.3)
Sodium: 141 mEq/L (ref 137–147)
Total Bilirubin: 1.2 mg/dL (ref 0.3–1.2)
Total Protein: 8.6 g/dL — ABNORMAL HIGH (ref 6.0–8.3)

## 2014-05-27 LAB — RAPID URINE DRUG SCREEN, HOSP PERFORMED
Amphetamines: NOT DETECTED
BENZODIAZEPINES: NOT DETECTED
Barbiturates: NOT DETECTED
COCAINE: NOT DETECTED
Opiates: NOT DETECTED
Tetrahydrocannabinol: NOT DETECTED

## 2014-05-27 LAB — CBC
HCT: 46.6 % (ref 39.0–52.0)
Hemoglobin: 16.4 g/dL (ref 13.0–17.0)
MCH: 29.8 pg (ref 26.0–34.0)
MCHC: 35.2 g/dL (ref 30.0–36.0)
MCV: 84.7 fL (ref 78.0–100.0)
PLATELETS: 213 10*3/uL (ref 150–400)
RBC: 5.5 MIL/uL (ref 4.22–5.81)
RDW: 13.4 % (ref 11.5–15.5)
WBC: 5.9 10*3/uL (ref 4.0–10.5)

## 2014-05-27 LAB — ETHANOL

## 2014-05-27 LAB — ACETAMINOPHEN LEVEL: Acetaminophen (Tylenol), Serum: <15 ug/mL (ref 10–30)

## 2014-05-27 LAB — SALICYLATE LEVEL

## 2014-05-27 MED ORDER — BENZTROPINE MESYLATE 1 MG PO TABS
0.5000 mg | ORAL_TABLET | Freq: Two times a day (BID) | ORAL | Status: DC
  Administered 2014-05-27 – 2014-05-28 (×2): 0.5 mg via ORAL
  Filled 2014-05-27 (×2): qty 1

## 2014-05-27 MED ORDER — IBUPROFEN 200 MG PO TABS: 600.0000 mg | ORAL_TABLET | Freq: Three times a day (TID) | ORAL | Status: DC | PRN

## 2014-05-27 MED ORDER — RISPERIDONE 1 MG PO TABS
1.0000 mg | ORAL_TABLET | Freq: Every day | ORAL | Status: DC
  Administered 2014-05-27: 1 mg via ORAL
  Filled 2014-05-27: qty 1

## 2014-05-27 MED ORDER — ACETAMINOPHEN 325 MG PO TABS: 650.0000 mg | ORAL_TABLET | ORAL | Status: DC | PRN

## 2014-05-27 MED ORDER — TRAZODONE HCL 50 MG PO TABS
50.0000 mg | ORAL_TABLET | Freq: Every day | ORAL | Status: DC
  Administered 2014-05-27: 50 mg via ORAL
  Filled 2014-05-27: qty 1

## 2014-05-27 MED ORDER — DIVALPROEX SODIUM ER 500 MG PO TB24
500.0000 mg | ORAL_TABLET | Freq: Two times a day (BID) | ORAL | Status: DC
  Administered 2014-05-27 – 2014-05-28 (×2): 500 mg via ORAL
  Filled 2014-05-27 (×3): qty 1

## 2014-05-27 MED ORDER — ZOLPIDEM TARTRATE 5 MG PO TABS: 5.0000 mg | ORAL_TABLET | Freq: Every evening | ORAL | Status: DC | PRN

## 2014-05-27 MED ORDER — RISPERIDONE MICROSPHERES 25 MG IM SUSR: 25.0000 mg | INTRAMUSCULAR | Status: DC

## 2014-05-27 MED ORDER — NICOTINE 21 MG/24HR TD PT24
21.0000 mg | MEDICATED_PATCH | Freq: Every day | TRANSDERMAL | Status: DC
  Filled 2014-05-27: qty 1

## 2014-05-27 MED ORDER — ONDANSETRON HCL 4 MG PO TABS: 4.0000 mg | ORAL_TABLET | Freq: Three times a day (TID) | ORAL | Status: DC | PRN

## 2014-05-27 MED ORDER — ALUM & MAG HYDROXIDE-SIMETH 200-200-20 MG/5ML PO SUSP
30.0000 mL | ORAL | Status: DC | PRN
Start: 2014-05-27 — End: 2014-05-28

## 2014-05-27 MED ORDER — LORAZEPAM 1 MG PO TABS: 1.0000 mg | ORAL_TABLET | Freq: Three times a day (TID) | ORAL | Status: DC | PRN

## 2014-05-27 NOTE — ED Notes (Signed)
Pt refusing blood work Charity fundraiserN made aware
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
## 2014-05-27 NOTE — ED Provider Notes (Signed)
Patient depressed over his wife's being pregnant by another man.  Her due date is tomorrow. Pt presently is feeling suicidal.  Results for orders placed or performed during the hospital encounter of 05/27/14  Acetaminophen level  Result Value Ref Range   Acetaminophen (Tylenol), Serum <15.0 10 - 30 ug/mL  CBC  Result Value Ref Range   WBC 5.9 4.0 - 10.5 K/uL   RBC 5.50 4.22 - 5.81 MIL/uL   Hemoglobin 16.4 13.0 - 17.0 g/dL   HCT 16.146.6 09.639.0 - 04.552.0 %   MCV 84.7 78.0 - 100.0 fL   MCH 29.8 26.0 - 34.0 pg   MCHC 35.2 30.0 - 36.0 g/dL   RDW 40.913.4 81.111.5 - 91.415.5 %   Platelets 213 150 - 400 K/uL  Comprehensive metabolic panel  Result Value Ref Range   Sodium 141 137 - 147 mEq/L   Potassium 3.9 3.7 - 5.3 mEq/L   Chloride 102 96 - 112 mEq/L   CO2 26 19 - 32 mEq/L   Glucose, Bld 103 (H) 70 - 99 mg/dL   BUN 9 6 - 23 mg/dL   Creatinine, Ser 7.820.90 0.50 - 1.35 mg/dL   Calcium 95.610.3 8.4 - 21.310.5 mg/dL   Total Protein 8.6 (H) 6.0 - 8.3 g/dL   Albumin 4.3 3.5 - 5.2 g/dL   AST 22 0 - 37 U/L   ALT 23 0 - 53 U/L   Alkaline Phosphatase 100 39 - 117 U/L   Total Bilirubin 1.2 0.3 - 1.2 mg/dL   GFR calc non Af Amer >90 >90 mL/min   GFR calc Af Amer >90 >90 mL/min   Anion gap 13 5 - 15  Ethanol (ETOH)  Result Value Ref Range   Alcohol, Ethyl (B) <11 0 - 11 mg/dL  Salicylate level  Result Value Ref Range   Salicylate Lvl <2.0 (L) 2.8 - 20.0 mg/dL  Urine Drug Screen  Result Value Ref Range   Opiates NONE DETECTED NONE DETECTED   Cocaine NONE DETECTED NONE DETECTED   Benzodiazepines NONE DETECTED NONE DETECTED   Amphetamines NONE DETECTED NONE DETECTED   Tetrahydrocannabinol NONE DETECTED NONE DETECTED   Barbiturates NONE DETECTED NONE DETECTED   No results found.  05/27/2014  Doug SouSam Jakwan Sally, MD 05/27/14 2320

## 2014-05-27 NOTE — ED Notes (Addendum)
[employer] reports pt walking on bridge, told police he didn't care what he had to do, he wanted to kill himself. After about 10-15 min Police able to talk him out of proceeding to jump off bridge. Office speaking with Sharee PimpleJudge regarding IVC papers.

## 2014-05-27 NOTE — ED Notes (Signed)
Brought in by [employer]. Pt states having SI and HI. Pt refusing to talk about condition at this time. Just states he's having suicidal and homicidal thoughts.

## 2014-05-27 NOTE — ED Notes (Signed)
Dr. Ethelda ChickJacubowitz at bedside to speak with pt.

## 2014-05-27 NOTE — BH Assessment (Signed)
Pt has been referred to the following facilities: [facility]

## 2014-05-27 NOTE — ED Notes (Signed)
Pt awake, alert & responsive, watching TV at present, no distress noted, will continue to monitor for safety.

## 2014-05-27 NOTE — ED Provider Notes (Signed)
CSN: [id number]     Arrival date & time 05/27/14  1624 History  This chart was scribed for Richard DredgeEmily Troy Kanouse, PA-C, working with Doug SouSam Jacubowitz, MD by Chestine SporeSoijett Blue, ED Scribe. The patient was seen in room WTR3/WLPT3 at 4:58 PM.    No chief complaint on file.   The history is provided by the patient. No language interpreter was used.   HPI Comments: Richard [name] is a 43 y.o. male who presents to the Emergency Department via [employer] complaining of SI onset today. He reports that he feels fine physically. He states that he is having associated symptoms of HI. He tried to kill himself recently by running in front of cars. He does take medication for his symptoms that he does not think works for him. Pt reports regarding the HI that it doesn't matter who he kills it is "Whoever I select" that he will kill. He does smoke cigarettes but denies EtOH use. He denies trouble breathing, fever, and any other associated symptoms. Denies trying to hurt himself recently.  Per [employer]:  Pt was walking on bridge and told police that he did not care what he had to do he wanted to kill himself. When the police got there the pt began to walk away from them. He was threatening to jump and he was SI/HI. He told the officers that if he wasn't able to jump off the bridge he would hurt anyone in order to kill himself. The pt informed [employer] that his wife has been cheating on him and she is having a baby by another man that is due tomorrow. The pt informed [employer] that he was unemployed as well. [employer] reports that the pt has not been hostile with them and he willingly after the officers building a rapport with him allowed them to handcuff him and bring him to the hospital. They report that the pt was trying to jump out in front of cars a couple weeks ago. [employer] states that the pt called 911 himself.   Past Medical History  Diagnosis Date  . Diabetes mellitus   . Hypertension   . Diabetic neuropathy    Past Surgical History  Procedure Laterality  Date  . Mouth surgery    . Tympanostomy tube placement     No family history on file. History  Substance Use Topics  . Smoking status: Current Every Day Smoker  . Smokeless tobacco: Not on file  . Alcohol Use: No    Review of Systems  Constitutional: Negative for fever.  Respiratory:       No trouble breathing  Psychiatric/Behavioral: Positive for suicidal ideas.       Homicidal ideation  All other systems reviewed and are negative.   Allergies  Review of patient's allergies indicates no known allergies.  Home Medications   Prior to Admission medications   Medication Sig Start Date End Date Taking? Authorizing Provider  benztropine (COGENTIN) 0.5 MG tablet Take 1 tablet (0.5 mg total) by mouth 2 (two) times daily. 03/06/14   Mojeed Akintayo  divalproex (DEPAKOTE ER) 500 MG 24 hr tablet Take 1 tablet (500 mg total) by mouth 3 (three) times daily before meals. 03/06/14   Mojeed Akintayo  divalproex (DEPAKOTE ER) 500 MG 24 hr tablet Take 1 tablet (500 mg total) by mouth 2 (two) times daily. 03/06/14   Mojeed Akintayo  risperiDONE (RISPERDAL) 1 MG tablet Take 1 tablet (1 mg total) by mouth at bedtime. 03/06/14   Mojeed Akintayo  risperiDONE microspheres (RISPERDAL CONSTA)  25 MG injection Inject 2 mLs (25 mg total) into the muscle every 14 (fourteen) days. 03/06/14   Mojeed Akintayo  traZODone (DESYREL) 50 MG tablet Take 1 tablet (50 mg total) by mouth at bedtime. 03/06/14   Mojeed Akintayo   BP 129/71 mmHg  Pulse 80  Temp(Src) 98 F (36.7 C) (Oral)  Resp 20  SpO2 100%  Physical Exam  Constitutional: He appears well-developed and well-nourished. No distress.  HENT:  Head: Normocephalic and atraumatic.  Neck: Neck supple.  Cardiovascular: Normal rate and regular rhythm.   Pulmonary/Chest: Effort normal and breath sounds normal. No respiratory distress. He has no wheezes. He has no rales.  Abdominal: Soft. He exhibits no distension and no mass. There is no tenderness. There is no  rebound and no guarding.  Musculoskeletal: He exhibits no edema.  Neurological: He is alert. He exhibits normal muscle tone.  Skin: He is not diaphoretic.  Psychiatric: He expresses homicidal and suicidal ideation. He expresses suicidal plans.  Nursing note and vitals reviewed.   ED Course  Procedures (including critical care time) DIAGNOSTIC STUDIES: Oxygen Saturation is 100% on room air, normal by my interpretation.    COORDINATION OF CARE: 4:58 PM-Discussed treatment plan which includes labs and sitter with pt at bedside and pt agreed to plan.   5:08 PM- spoke with Dr. Romeo AppleHarrison (Psych doctor) and will see the pt for his "First Exam" as the pt is IVC.  Labs Review Labs Reviewed  COMPREHENSIVE METABOLIC PANEL - Abnormal; Notable for the following:    Glucose, Bld 103 (*)    Total Protein 8.6 (*)    All other components within normal limits  SALICYLATE LEVEL - Abnormal; Notable for the following:    Salicylate Lvl <2.0 (*)    All other components within normal limits  ACETAMINOPHEN LEVEL  CBC  ETHANOL  URINE RAPID DRUG SCREEN (HOSP PERFORMED)    Imaging Review No results found.   EKG Interpretation None      MDM   Final diagnoses:  Suicidal behavior  Homicidal ideation  Suicidal ideation   Afebrile, nontoxic patient with suicidal ideation, homicidal ideation, brought in by police when patient called 911 saying he was going to jump off the bridge he was standing on.  IVC paperwork filed by police.  Pt admits to SI and HI stating the HI is against not a specific person but just "whoever I select."  Placed in psych hold.  Dr Romeo AppleHarrison to perform "first exam" required for IVC.  Labs unremarkable. Pending TTS evaluation for placement.     I personally performed the services described in this documentation, which was scribed in my presence. The recorded information has been reviewed and is accurate.    Richard Dredgemily Bryahna Lesko, PA-C 05/27/14 1912  Doug SouSam Jacubowitz, MD 05/27/14  [phone]

## 2014-05-28 DIAGNOSIS — F333 Major depressive disorder, recurrent, severe with psychotic symptoms: Secondary | ICD-10-CM

## 2014-05-28 DIAGNOSIS — R45851 Suicidal ideations: Secondary | ICD-10-CM

## 2014-05-28 NOTE — BH Assessment (Signed)
[facility] Assessment Progress Note      Pt was declined from [facility] due to acuity.

## 2014-05-28 NOTE — BH Assessment (Signed)
[facility] Assessment Progress Note  Darlene reportedly called from [facility] requesting IVC paperwork for this pt.  Papers were faxed to [phone].  Pt has been accepted by Dr Michelle PiperSand to Rm 2585-1.  Please call report to [phone].  Pt's nurse has been notified.  Doylene Canninghomas Noura Purpura, MA Triage Specialist 05/28/2014 @ 15:55

## 2014-05-28 NOTE — BHH Counselor (Signed)
Referral faxed to [facility]   Jarid Sasso, M.S., LPCA, NCC Licensed Professional Counselor Associate  Triage Specialist   [facility]  Therapeutic Triage Services Phone: [phone] Fax: [phone] 

## 2014-05-28 NOTE — ED Notes (Signed)
Pt transported to [facility] [place] continuation of specialized care. He left in no acute distress.

## 2014-05-28 NOTE — Progress Notes (Signed)
D: Pt resting in bed at present. Cooperative with care thus far this shift. Alert / oriented to self, place, time and situation. Compliant with scheduled PO medications. Refused Nicotine patch when offered. Pt scheduled to be d/[facility] to [facility] this evening. A: All  medications given as per order. Writer called report to Thurston HoleAnne, RN at [facility] earlier today where pt has been accepted. Sheriff was also called for IVC transport, stated he will notify his staff of request. Safety maintained on Q 15 minutes checks as ordered. Support and availability offered. R: Pt withdrawned to his room. OOB to bathroom and back to room. Informed of d/c order and was in agreement. Voiced no concerns to staff at present. Denied A / V hallucinations when assessed. Contracts for safety while hospitalized.

## 2014-05-28 NOTE — Progress Notes (Signed)
Pt accepted [facility] by Dr. Michelle PiperSand once bed is ready. Pt to go to room 2584-1 once CSW recieves call back that patient bed is cleaned and ready. Pt to be transported under IVC.   Byrd HesselbachKristen Vasiliki Smaldone, LCSW [phone]  ED CSW 05/28/2014 1322pm

## 2014-05-28 NOTE — Progress Notes (Addendum)
Pt referred to: Richard [name], Richard [name]  [place] [address]-per Alecia LemmingVictor.  [facility].    Byrd HesselbachKristen Ruthie Berch, LCSW [phone]  ED CSW 05/28/2014 11:41 AM

## 2014-05-28 NOTE — Consult Note (Signed)
[facility] Face-to-Face Psychiatry Consult   Reason for Consult:  Suicidal ideation with the plan to jump off a bridge Referring Physician:  EDP Laquan [name] is an 43 y.o. male. Total Time spent with patient: 45 minutes  Assessment: AXIS I:  Major Depression, Recurrent severe with psychosis AXIS II:  Deferred AXIS III:   Past Medical History  Diagnosis Date  . Diabetes mellitus   . Hypertension   . Diabetic neuropathy    AXIS IV:  other psychosocial or environmental problems and problems related to social environment AXIS V:  11-20 some danger of hurting self or others possible OR occasionally fails to maintain minimal personal hygiene OR gross impairment in communication  Plan:  Recommend psychiatric Inpatient admission when medically cleared.  Subjective:   Richard [name] is a 43 y.o. male patient admitted with severe depression and suicidal thoughts.  HPI: Richard [name] is an 43 y.o. Male with history of depression and suicidality. Pt presents to [facility] endorsing SI and HI. He was subsequently placed under IVC. Patient is known to the ED staffs and has had multiple ED visits in the last few months. He reports being overwhelmed after he found out that his wife of 48 years who got pregnant for another is going to have her baby delivered today. He obsessively thinks about how his wife is dating another man and that he and wife are still married. He says, "I am sick of living." Patient reports that he was found by the police yesterday walking on the bridge and threatened to jump off. He also reports recurrent thoughts of wanting to shoot his estranged wife's boyfriend and then shoot himself. He says, "I'll hurt him first, then I don't want to go to jail so I'd take myself out.'' Patient reports severe depressive symptoms, feeling hopeless, worthless, has trouble sleeping and loss of interest in life. Pt denies psychosis or delusional thinking but states that he mind has taken over this thoughts.  Pt denies  hx substance abuse. Per chart review, pt was admitted to [facility] and d/c 11/15/13. He was also admitted 12/12/2013. He sts he also was admitted to [facility] earlier this year and [place] years ago. However, he has poor compliance with his medications and outpatient follow up.   Per [employer]:  Pt was walking on bridge and told police that he did not care what he had to do he wanted to kill himself. When the police got there the pt began to walk away from them. He was threatening to jump and he was SI/HI. He told the officers that if he wasn't able to jump off the bridge he would hurt anyone in order to kill himself. The pt informed [employer] that his wife has been cheating on him and she is having a baby by another man that is due tomorrow. The pt informed [employer] that he was unemployed as well. [employer] reports that the pt has not been hostile with them and he willingly after the officers building a rapport with him allowed them to handcuff him and bring him to the hospital. They report that the pt was trying to jump out in front of cars a couple weeks ago. [employer] states that the pt called 911 himself.   HPI Elements:   Location:  depression, suicidal. Quality:  severe. Duration:  for months. Context:  wife pregnant for another man.  Past Psychiatric History: Past Medical History  Diagnosis Date  . Diabetes mellitus   . Hypertension   .  Diabetic neuropathy     reports that he has been smoking.  He does not have any smokeless tobacco history on file. He reports that he does not drink alcohol or use illicit drugs. No family history on file. Family History Family Supports: No Living Arrangements: Other (Comment) ("I live with people that's all you need to know") Can pt return to current living arrangement?: Yes Abuse/Neglect [facility]) Physical Abuse: Denies Verbal Abuse: Denies Sexual Abuse: Denies Allergies:  No Known Allergies  ACT Assessment Complete:  Yes:    Educational Status    Risk to Self:  Risk to self with the past 6 months Suicidal Ideation: Yes-Currently Present Suicidal Intent: Yes-Currently Present Is patient at risk for suicide?: Yes Suicidal Plan?: Yes-Currently Present Specify Current Suicidal Plan:  (jump off a bridge) Access to Means: Yes Specify Access to Suicidal Means:  (access to a bridge and ability to jump off) What has been your use of drugs/alcohol within the last 12 months?:  (patient denies ) Previous Attempts/Gestures: Yes How many times?:  (patient reports 1 prior attempt to walk into traffic ) Other Self Harm Risks:  (n/a) Triggers for Past Attempts: Other (Comment) (upset with spouse for getting preg by another man) Intentional Self Injurious Behavior: None Family Suicide History: See progress notes (Pt sts, "I don't want to talk about that") Recent stressful life event(s): Other (Comment) (spouse having a baby by another man tommorow) Persecutory voices/beliefs?: No Depression: Yes Depression Symptoms: Feeling angry/irritable, Feeling worthless/self pity, Loss of interest in usual pleasures, Guilt, Isolating, Fatigue, Tearfulness, Insomnia, Despondent Substance abuse history and/or treatment for substance abuse?:  (unknown) Suicide prevention information given to non-admitted patients: Not applicable  Risk to Others: Risk to Others within the past 6 months Homicidal Ideation: Yes-Currently Present Thoughts of Harm to Others: Yes-Currently Present Comment - Thoughts of Harm to Others:  ("Anyone") Current Homicidal Intent: Yes-Currently Present Current Homicidal Plan:  (shoot another person or shoot the man spouse is preg by) Access to Homicidal Means: Yes Describe Access to Homicidal Means:  (spouse's new boyfriend ) Identified Victim:  ("Anyone" or "They man that took me away from my wife") History of harm to others?: No Assessment of Violence: None Noted Violent Behavior Description:  (patient calm and cooperative ) Does patient have access to  weapons?: No Criminal Charges Pending?: No Does patient have a court date: No  Abuse: Abuse/Neglect Assessment (Assessment to be complete while patient is alone) Physical Abuse: Denies Verbal Abuse: Denies Sexual Abuse: Denies Exploitation of patient/patient's resources: Denies Self-Neglect: Denies  Prior Inpatient Therapy: Prior Inpatient Therapy Prior Inpatient Therapy: Yes Prior Therapy Dates:  Audiological scientist [place], [facility] (2x's), [facility], [facility] dates) Prior Therapy Facilty/Provider(s):  ([place], [facility], [facility], [facility]) Reason for Treatment:  (depression.relationship stress, anxiety)  Prior Outpatient Therapy:    Additional Information: Additional Information 1:1 In Past 12 Months?: No CIRT Risk: No Elopement Risk: No Does patient have medical clearance?: Yes                  Objective: Blood pressure 101/59, pulse 70, temperature 97.7 F (36.5 C), temperature source Oral, resp. rate 18, SpO2 99 %.There is no weight on file to calculate BMI. Results for orders placed or performed during the hospital encounter of 05/27/14 (from the past 72 hour(s))  Acetaminophen level     Status: None   Collection Time: 05/27/14  5:06 PM  Result Value Ref Range   Acetaminophen (Tylenol), Serum <15.0 10 - 30 ug/mL  Comment:        THERAPEUTIC CONCENTRATIONS VARY SIGNIFICANTLY. A RANGE OF 10-30 ug/mL MAY BE AN EFFECTIVE CONCENTRATION FOR MANY PATIENTS. HOWEVER, SOME ARE BEST TREATED AT CONCENTRATIONS OUTSIDE THIS RANGE. ACETAMINOPHEN CONCENTRATIONS >150 ug/mL AT 4 HOURS AFTER INGESTION AND >50 ug/mL AT 12 HOURS AFTER INGESTION ARE OFTEN ASSOCIATED WITH TOXIC REACTIONS.   CBC     Status: None   Collection Time: 05/27/14  5:06 PM  Result Value Ref Range   WBC 5.9 4.0 - 10.5 K/uL   RBC 5.50 4.22 - 5.81 MIL/uL   Hemoglobin 16.4 13.0 - 17.0 g/dL   HCT 46.6 39.0 - 52.0 %   MCV 84.7 78.0 - 100.0 fL   MCH 29.8 26.0 - 34.0 pg   MCHC 35.2 30.0 - 36.0 g/dL    RDW 13.4 11.5 - 15.5 %   Platelets 213 150 - 400 K/uL  Comprehensive metabolic panel     Status: Abnormal   Collection Time: 05/27/14  5:06 PM  Result Value Ref Range   Sodium 141 137 - 147 mEq/L   Potassium 3.9 3.7 - 5.3 mEq/L   Chloride 102 96 - 112 mEq/L   CO2 26 19 - 32 mEq/L   Glucose, Bld 103 (H) 70 - 99 mg/dL   BUN 9 6 - 23 mg/dL   Creatinine, Ser 0.90 0.50 - 1.35 mg/dL   Calcium 10.3 8.4 - 10.5 mg/dL   Total Protein 8.6 (H) 6.0 - 8.3 g/dL   Albumin 4.3 3.5 - 5.2 g/dL   AST 22 0 - 37 U/L   ALT 23 0 - 53 U/L   Alkaline Phosphatase 100 39 - 117 U/L   Total Bilirubin 1.2 0.3 - 1.2 mg/dL   GFR calc non Af Amer >90 >90 mL/min   GFR calc Af Amer >90 >90 mL/min    Comment: (NOTE) The eGFR has been calculated using the CKD EPI equation. This calculation has not been validated in all clinical situations. eGFR's persistently <90 mL/min signify possible Chronic Kidney Disease.    Anion gap 13 5 - 15  Ethanol (ETOH)     Status: None   Collection Time: 05/27/14  5:06 PM  Result Value Ref Range   Alcohol, Ethyl (B) <11 0 - 11 mg/dL    Comment:        LOWEST DETECTABLE LIMIT FOR SERUM ALCOHOL IS 11 mg/dL FOR MEDICAL PURPOSES ONLY   Salicylate level     Status: Abnormal   Collection Time: 05/27/14  5:06 PM  Result Value Ref Range   Salicylate Lvl <7.8 (L) 2.8 - 20.0 mg/dL  Urine Drug Screen     Status: None   Collection Time: 05/27/14  5:51 PM  Result Value Ref Range   Opiates NONE DETECTED NONE DETECTED   Cocaine NONE DETECTED NONE DETECTED   Benzodiazepines NONE DETECTED NONE DETECTED   Amphetamines NONE DETECTED NONE DETECTED   Tetrahydrocannabinol NONE DETECTED NONE DETECTED   Barbiturates NONE DETECTED NONE DETECTED    Comment:        DRUG SCREEN FOR MEDICAL PURPOSES ONLY.  IF CONFIRMATION IS NEEDED FOR ANY PURPOSE, NOTIFY LAB WITHIN 5 DAYS.        LOWEST DETECTABLE LIMITS FOR URINE DRUG SCREEN Drug Class       Cutoff (ng/mL) Amphetamine      1000 Barbiturate       200 Benzodiazepine   675 Tricyclics       449 Opiates  300 Cocaine          300 THC              50    Labs are reviewed and are pertinent for the above.  Current Facility-Administered Medications  Medication Dose Route Frequency Provider Last Rate Last Dose  . acetaminophen (TYLENOL) tablet 650 mg  650 mg Oral Q4H PRN Clayton Bibles, PA-C      . alum & mag hydroxide-simeth (MAALOX/MYLANTA) 200-200-20 MG/5ML suspension 30 mL  30 mL Oral PRN Clayton Bibles, PA-C      . benztropine (COGENTIN) tablet 0.5 mg  0.5 mg Oral BID Emily West, PA-C   0.5 mg at 05/27/14 2156  . divalproex (DEPAKOTE ER) 24 hr tablet 500 mg  500 mg Oral BID Clayton Bibles, PA-C   500 mg at 05/27/14 2216  . ibuprofen (ADVIL,MOTRIN) tablet 600 mg  600 mg Oral Q8H PRN Clayton Bibles, PA-C      . LORazepam (ATIVAN) tablet 1 mg  1 mg Oral Q8H PRN Clayton Bibles, PA-C      . nicotine (NICODERM CQ - dosed in mg/24 hours) patch 21 mg  21 mg Transdermal Daily Emily West, PA-C   21 mg at 05/27/14 2253  . ondansetron (ZOFRAN) tablet 4 mg  4 mg Oral Q8H PRN Clayton Bibles, PA-C      . risperiDONE (RISPERDAL) tablet 1 mg  1 mg Oral QHS Emily West, PA-C   1 mg at 05/27/14 2157  . risperiDONE microspheres (RISPERDAL CONSTA) injection 25 mg  25 mg Intramuscular Q14 Days Emily West, PA-C   25 mg at 05/28/14 2395  . traZODone (DESYREL) tablet 50 mg  50 mg Oral QHS Emily West, PA-C   50 mg at 05/27/14 2157   Current Outpatient Prescriptions  Medication Sig Dispense Refill  . divalproex (DEPAKOTE ER) 500 MG 24 hr tablet Take 1 tablet (500 mg total) by mouth 3 (three) times daily before meals. 90 tablet 0  . benztropine (COGENTIN) 0.5 MG tablet Take 1 tablet (0.5 mg total) by mouth 2 (two) times daily. 60 tablet 0  . divalproex (DEPAKOTE ER) 500 MG 24 hr tablet Take 1 tablet (500 mg total) by mouth 2 (two) times daily. 60 tablet 0  . risperiDONE (RISPERDAL) 1 MG tablet Take 1 tablet (1 mg total) by mouth at bedtime. 30 tablet 0  . risperiDONE  microspheres (RISPERDAL CONSTA) 25 MG injection Inject 2 mLs (25 mg total) into the muscle every 14 (fourteen) days. 1 each 3  . traZODone (DESYREL) 50 MG tablet Take 1 tablet (50 mg total) by mouth at bedtime. 30 tablet 0    Psychiatric Specialty Exam:     Blood pressure 101/59, pulse 70, temperature 97.7 F (36.5 C), temperature source Oral, resp. rate 18, SpO2 99 %.There is no weight on file to calculate BMI.  General Appearance: Disheveled  Eye Contact::  Minimal  Speech:  Clear and Coherent  Volume:  Decreased  Mood:  Depressed  Affect:  Constricted  Thought Process:  Goal Directed  Orientation:  Full (Time, Place, and Person)  Thought Content:  Hallucinations: Auditory  Suicidal Thoughts:  Yes.  with intent/plan  Homicidal Thoughts:  No  Memory:  Immediate;   Fair Recent;   Fair Remote;   Fair  Judgement:  Impaired  Insight:  Lacking  Psychomotor Activity:  Decreased  Concentration:  Fair  Recall:  Good  Fund of Knowledge:Good  Language: Fair  Akathisia:  No  Handed:  Right  AIMS (if indicated):     Assets:  Communication Skills Desire for Improvement Physical Health  Sleep:   poor   Musculoskeletal: Strength & Muscle Tone: within normal limits Gait & Station: normal Patient leans: N/A  Treatment Plan Summary: Daily contact with patient to assess and evaluate symptoms and progress in treatment Medication management Patient will benefit from inpatient treatment  Corena Pilgrim, MD 05/28/2014 10:44 AM

## 2014-07-09 ENCOUNTER — Encounter (HOSPITAL_COMMUNITY): Payer: Self-pay | Admitting: Emergency Medicine

## 2014-07-09 ENCOUNTER — Emergency Department (HOSPITAL_COMMUNITY)
Admission: EM | Admit: 2014-07-09 | Discharge: 2014-07-11 | Disposition: A | Discharge status: Home / Self Care | Payer: Self-pay | Attending: Emergency Medicine | Admitting: Emergency Medicine

## 2014-07-09 DIAGNOSIS — F1120 Opioid dependence, uncomplicated: Secondary | ICD-10-CM | POA: Insufficient documentation

## 2014-07-09 DIAGNOSIS — Z79899 Other long term (current) drug therapy: Secondary | ICD-10-CM | POA: Insufficient documentation

## 2014-07-09 DIAGNOSIS — I10 Essential (primary) hypertension: Secondary | ICD-10-CM | POA: Insufficient documentation

## 2014-07-09 DIAGNOSIS — F911 Conduct disorder, childhood-onset type: Secondary | ICD-10-CM | POA: Insufficient documentation

## 2014-07-09 DIAGNOSIS — Z720 Tobacco use: Secondary | ICD-10-CM | POA: Insufficient documentation

## 2014-07-09 DIAGNOSIS — F322 Major depressive disorder, single episode, severe without psychotic features: Secondary | ICD-10-CM | POA: Insufficient documentation

## 2014-07-09 DIAGNOSIS — R45851 Suicidal ideations: Secondary | ICD-10-CM

## 2014-07-09 DIAGNOSIS — E1140 Type 2 diabetes mellitus with diabetic neuropathy, unspecified: Secondary | ICD-10-CM | POA: Insufficient documentation

## 2014-07-09 LAB — CBC WITH DIFFERENTIAL/PLATELET
BASOS PCT: 1 % (ref 0–1)
Basophils Absolute: 0 10*3/uL (ref 0.0–0.1)
EOS PCT: 5 % (ref 0–5)
Eosinophils Absolute: 0.2 10*3/uL (ref 0.0–0.7)
HCT: 43.5 % (ref 39.0–52.0)
HEMOGLOBIN: 15.3 g/dL (ref 13.0–17.0)
LYMPHS ABS: 1.8 10*3/uL (ref 0.7–4.0)
Lymphocytes Relative: 50 % — ABNORMAL HIGH (ref 12–46)
MCH: 29.9 pg (ref 26.0–34.0)
MCHC: 35.2 g/dL (ref 30.0–36.0)
MCV: 85 fL (ref 78.0–100.0)
Monocytes Absolute: 0.2 10*3/uL (ref 0.1–1.0)
Monocytes Relative: 5 % (ref 3–12)
NEUTROS ABS: 1.4 10*3/uL — AB (ref 1.7–7.7)
NEUTROS PCT: 39 % — AB (ref 43–77)
PLATELETS: 193 10*3/uL (ref 150–400)
RBC: 5.12 MIL/uL (ref 4.22–5.81)
RDW: 12.6 % (ref 11.5–15.5)
WBC: 3.6 10*3/uL — AB (ref 4.0–10.5)

## 2014-07-09 LAB — COMPREHENSIVE METABOLIC PANEL
ALK PHOS: 86 U/L (ref 39–117)
ALT: 18 U/L (ref 0–53)
AST: 26 U/L (ref 0–37)
Albumin: 4.3 g/dL (ref 3.5–5.2)
Anion gap: 7 (ref 5–15)
BUN: 11 mg/dL (ref 6–23)
CO2: 27 mmol/L (ref 19–32)
CREATININE: 0.88 mg/dL (ref 0.50–1.35)
Calcium: 9.4 mg/dL (ref 8.4–10.5)
Chloride: 107 mmol/L (ref 96–112)
GFR calc non Af Amer: >90 mL/min (ref >90)
Glucose, Bld: 139 mg/dL — ABNORMAL HIGH (ref 70–99)
POTASSIUM: 4 mmol/L (ref 3.5–5.1)
Sodium: 141 mmol/L (ref 135–145)
TOTAL PROTEIN: 7.8 g/dL (ref 6.0–8.3)
Total Bilirubin: 1 mg/dL (ref 0.3–1.2)

## 2014-07-09 LAB — RAPID URINE DRUG SCREEN, HOSP PERFORMED
Amphetamines: NOT DETECTED
Barbiturates: NOT DETECTED
Benzodiazepines: NOT DETECTED
Cocaine: NOT DETECTED
OPIATES: NOT DETECTED
TETRAHYDROCANNABINOL: NOT DETECTED

## 2014-07-09 LAB — ETHANOL: Alcohol, Ethyl (B): <5 mg/dL (ref 0–9)

## 2014-07-09 NOTE — BH Assessment (Addendum)
Tele Assessment Note   Richard [name] is an 44 y.o. male that reports voices are telling him to harm others that are doing better than he is and to shot himself.  Patient reports that he is not able to contract for safety.  Patient reports that he does not have access to any guns or weapons.  Patient reports that he has been feeling depressed and anxious for the past couple of days.  Patient reports that he has been using opiates for the past couple of years.  Patient reports that his last use of opiates was yesterday.  Patient reports that he takes between 7-13 pills daily.  Patient reports that he does not remember the mg.    Patient reports a history of psychiatric hospitalizations.  Patient reports that he did not follow up with medication management after he was discharged from his last psychotic hospitalization in 2015.  Patient reports that he lives with his father in law and that he and his wife are estranged.  Patient reports that he is no longer working and, "he is not able to get his life on track".    Axis I: Mood Disorder NOS Axis II: Deferred Axis III:  Past Medical History  Diagnosis Date  . Diabetes mellitus   . Hypertension   . Diabetic neuropathy    Axis IV: economic problems, housing problems, occupational problems, other psychosocial or environmental problems, problems related to social environment and problems with primary support group Axis V: 21-30 behavior considerably influenced by delusions or hallucinations OR serious impairment in judgment, communication OR inability to function in almost all areas  Past Medical History:  Past Medical History  Diagnosis Date  . Diabetes mellitus   . Hypertension   . Diabetic neuropathy     Past Surgical History  Procedure Laterality Date  . Mouth surgery    . Tympanostomy tube placement      Family History: No family history on file.  Social History:  reports that he has been smoking.  He does not have any smokeless tobacco  history on file. He reports that he uses illicit drugs. He reports that he does not drink alcohol.  Additional Social History:     CIWA: CIWA-Ar BP: 142/80 mmHg Pulse Rate: 74 COWS:    PATIENT STRENGTHS: (choose at least two) Average or above average intelligence Capable of independent living Communication skills  Allergies: No Known Allergies  Home Medications:  (Not in a hospital admission)  OB/GYN Status:  No LMP for male patient.  General Assessment Data [facility] of Assessment: [facility] Is this a Tele or Face-to-Face Assessment?: Face-to-Face Is this an Initial Assessment or a Re-assessment for this encounter?: Initial Assessment Living Arrangements: Other (Comment) (Lives with his father in law. ) Can pt return to current living arrangement?: Yes Admission Status: Voluntary Is patient capable of signing voluntary admission?: Yes Transfer from: Home Referral Source: Self/Family/Friend  Medical Screening Exam [facility] Walk-in ONLY) Medical Exam completed: Yes  [facility] Crisis Care Plan Living Arrangements: Other (Comment) (Lives with his father in law. ) Name of Psychiatrist: None Reported Name of Therapist: None Reported  Education Status Is patient currently in school?: No Current Grade: NA Highest grade of school patient has completed: NA Name of school: NA Contact person: NA  Risk to self with the past 6 months Suicidal Ideation: Yes-Currently Present Suicidal Intent: Yes-Currently Present Is patient at risk for suicide?: Yes Suicidal Plan?: Yes-Currently Present Specify Current Suicidal Plan: Shot himself with a gun. Access  to Means: No Specify Access to Suicidal Means: Shoting himself with a gun. What has been your use of drugs/alcohol within the last 12 months?: Opiates Previous Attempts/Gestures: Yes How many times?: 3 Other Self Harm Risks: None Reported Triggers for Past Attempts: Other (Comment) Intentional Self Injurious Behavior: None Family Suicide  History: No Recent stressful life event(s): Conflict (Comment), Job Loss, Financial Problems Persecutory voices/beliefs?: Yes Depression: Yes Depression Symptoms: Despondent, Insomnia, Tearfulness, Isolating, Fatigue, Guilt, Loss of interest in usual pleasures, Feeling worthless/self pity, Feeling angry/irritable Substance abuse history and/or treatment for substance abuse?: Yes Suicide prevention information given to non-admitted patients: Yes  Risk to Others within the past 6 months Homicidal Ideation: Yes-Currently Present Thoughts of Harm to Others: Yes-Currently Present Comment - Thoughts of Harm to Others: Wanting to harm others who are doing better than he is.  Current Homicidal Intent: No-Not Currently/Within Last 6 Months Current Homicidal Plan: No Access to Homicidal Means: No Describe Access to Homicidal Means: None Reported Identified Victim: None Reported History of harm to others?: No Assessment of Violence: None Noted Violent Behavior Description: None Reported Does patient have access to weapons?: No Criminal Charges Pending?: No Does patient have a court date: No  Psychosis Hallucinations: Auditory (Voices are telling him to harm others.) Delusions: None noted  Mental Status Report Appear/Hygiene: Disheveled Eye Contact: Poor Motor Activity: Freedom of movement Speech: Logical/coherent Level of Consciousness: Alert Mood: Depressed, Anxious Affect: Anxious, Depressed Anxiety Level: Minimal Thought Processes: Coherent, Relevant Judgement: Unimpaired Orientation: Person, Time, Place, Situation Obsessive Compulsive Thoughts/Behaviors: None  Cognitive Functioning Concentration: Decreased Memory: Recent Intact, Remote Intact IQ: Average Insight: Fair Impulse Control: Poor Appetite: Fair Weight Loss: 0 Weight Gain: 0 Sleep: Decreased Total Hours of Sleep: 5 Vegetative Symptoms: Decreased grooming, Staying in bed  ADLScreening [facility] Assessment  Services) Patient's cognitive ability adequate to safely complete daily activities?: Yes Patient able to express need for assistance with ADLs?: No Independently performs ADLs?: Yes (appropriate for developmental age)  Prior Inpatient Therapy Prior Inpatient Therapy: Yes Prior Therapy Dates: 2015 Prior Therapy Facilty/Provider(s): Unable to remember the name of the facility. Reason for Treatment: SI  Prior Outpatient Therapy Prior Outpatient Therapy: No Prior Therapy Dates: None Reported Prior Therapy Facilty/Provider(s): None Reported Reason for Treatment: NA  ADL Screening (condition at time of admission) Patient's cognitive ability adequate to safely complete daily activities?: Yes Patient able to express need for assistance with ADLs?: No Independently performs ADLs?: Yes (appropriate for developmental age)             Advance Directives (For Healthcare) Does patient have an advance directive?: No Would patient like information on creating an advanced directive?: No - patient declined information    Additional Information 1:1 In Past 12 Months?: No CIRT Risk: No Elopement Risk: No Does patient have medical clearance?: Yes     Disposition: Per Julieanne CottonJosephine, patient meets criteria for inpatient hospitalization.   Disposition Initial Assessment Completed for this Encounter: Yes Disposition of Patient: Inpatient treatment program Type of inpatient treatment program: Adult  Linton RumpStevenson, Corday Wyka LaVerne 07/09/2014 4:55 PM

## 2014-07-09 NOTE — ED Notes (Signed)
Pt AAO x 3, resting at present, no distress noted, will monitor for safety.

## 2014-07-09 NOTE — Progress Notes (Signed)
CSW faxed patient referral to the following inpatient facilities:  [facility], [facility], [facility], [facility], [facility], and [facility].  Richard [name], LCSWA Disposition staff 07/09/2014 11:12 PM

## 2014-07-09 NOTE — ED Provider Notes (Signed)
CSN: [id number]     Arrival date & time 07/09/14  1048 History   First MD Initiated Contact with Patient 07/09/14 1116     Chief Complaint  Patient presents with  . sucidal      (Consider location/radiation/quality/duration/timing/severity/associated sxs/prior Treatment) HPI Comments: Patient is a 44 year old male with past medical history of hypertension and type 2 diabetes. He presents with complaints of suicidal ideation. He states that he feels depressed. Much of these feelings arise from his lack of employment and his drug addiction. He has had thoughts of harming himself as well as others. This morning his feelings came to a point where he called 911 and was brought here by law enforcement for psychiatric evaluation.  Patient is a 44 y.o. male presenting with mental health disorder.  Mental Health Problem Presenting symptoms: aggressive behavior, depression and suicidal thoughts   Patient accompanied by:  Law enforcement Degree of incapacity (severity):  Moderate Onset quality:  Gradual Duration:  1 week Timing:  Constant Progression:  Worsening Chronicity:  Recurrent Context: drug abuse   Treatment compliance:  Untreated Relieved by:  Nothing Worsened by:  Nothing tried   Past Medical History  Diagnosis Date  . Diabetes mellitus   . Hypertension   . Diabetic neuropathy    Past Surgical History  Procedure Laterality Date  . Mouth surgery    . Tympanostomy tube placement     No family history on file. History  Substance Use Topics  . Smoking status: Current Every Day Smoker  . Smokeless tobacco: Not on file  . Alcohol Use: No    Review of Systems  Psychiatric/Behavioral: Positive for suicidal ideas.  All other systems reviewed and are negative.     Allergies  Review of patient's allergies indicates no known allergies.  Home Medications   Prior to Admission medications   Medication Sig Start Date End Date Taking? Authorizing Provider  benztropine  (COGENTIN) 0.5 MG tablet Take 1 tablet (0.5 mg total) by mouth 2 (two) times daily. 03/06/14  Yes Mojeed Akintayo  divalproex (DEPAKOTE ER) 500 MG 24 hr tablet Take 1 tablet (500 mg total) by mouth 3 (three) times daily before meals. 03/06/14  Yes Mojeed Akintayo  metFORMIN (GLUCOPHAGE) 500 MG tablet Take 500 mg by mouth 2 (two) times daily with a meal.   Yes Historical Provider, MD  OVER THE COUNTER MEDICATION Take 1 tablet by mouth daily. Blood pressure.   Yes Historical Provider, MD  traZODone (DESYREL) 50 MG tablet Take 1 tablet (50 mg total) by mouth at bedtime. 03/06/14  Yes Mojeed Akintayo  risperiDONE (RISPERDAL) 1 MG tablet Take 1 tablet (1 mg total) by mouth at bedtime. Patient not taking: Reported on 07/09/2014 03/06/14   Mojeed Akintayo  risperiDONE microspheres (RISPERDAL CONSTA) 25 MG injection Inject 2 mLs (25 mg total) into the muscle every 14 (fourteen) days. Patient not taking: Reported on 07/09/2014 03/06/14   Mojeed Akintayo   BP 142/80 mmHg  Pulse 74  Temp(Src) 98 F (36.7 C) (Oral)  Resp 18  SpO2 98% Physical Exam  Constitutional: He is oriented to person, place, and time. He appears well-developed and well-nourished. No distress.  HENT:  Head: Normocephalic and atraumatic.  Eyes: EOM are normal. Pupils are equal, round, and reactive to light.  Neck: Normal range of motion. Neck supple.  Cardiovascular: Normal rate, regular rhythm and normal heart sounds.   No murmur heard. Pulmonary/Chest: Effort normal and breath sounds normal. No respiratory distress. He has no wheezes.  Abdominal:  Soft. Bowel sounds are normal. He exhibits no distension. There is no tenderness.  Musculoskeletal: Normal range of motion. He exhibits no edema.  Neurological: He is alert and oriented to person, place, and time. No cranial nerve deficit.  Skin: Skin is warm and dry. He is not diaphoretic.  Psychiatric: His speech is normal. His affect is blunt. He is aggressive. Cognition and memory are  normal. He expresses impulsivity. He expresses homicidal and suicidal ideation.  Nursing note and vitals reviewed.   ED Course  Procedures (including critical care time) Labs Review Labs Reviewed  CBC WITH DIFFERENTIAL/PLATELET - Abnormal; Notable for the following:    WBC 3.6 (*)    Neutrophils Relative % 39 (*)    Neutro Abs 1.4 (*)    Lymphocytes Relative 50 (*)    All other components within normal limits  COMPREHENSIVE METABOLIC PANEL  URINE RAPID DRUG SCREEN (HOSP PERFORMED)  ETHANOL    Imaging Review No results found.   EKG Interpretation None      MDM   Final diagnoses:  None    Patient presents with suicidal ideation and depression. Will be evaluated by TTS we will determine the final disposition.    Geoffery Lyons, MD 07/10/14 [id number]

## 2014-07-09 NOTE — ED Notes (Signed)
Security at bedside wanding patient  

## 2014-07-09 NOTE — ED Notes (Signed)
MD Delo at bedside. He would like patient moved to TCU or [facility].

## 2014-07-09 NOTE — ED Notes (Addendum)
Belongings documented on belonging sheet. Pt refuses to sign sheet. Pt has glasses on. Pt states he has 2 cellphones in inventory he only has one black cellphone.

## 2014-07-09 NOTE — Progress Notes (Signed)
  CARE MANAGEMENT ED NOTE 07/09/2014  Patient:  Richard [name],Richard [name]   Account Number:  [id number]  Date Initiated:  07/09/2014  Documentation initiated by:  Edd ArbourGIBBS,Treylen   Subjective/Objective Assessment:   44 yr old self pay [place] pt called police because he does not feel safe-addicted to pain pills-stays he is going through a lot-feels worthless-states drug habit is interfering in daily life                  Subjective/Objective Assessment Detail:   no pcp  [facility] and this CM in August 2015  Pt confirms not following up with resources provided in 2015     Action/Plan:   see notes below   Action/Plan Detail:   Anticipated DC Date:       Status Recommendation to Physician:   Result of Recommendation:    Other ED Services  Consult Working Psychologist, educationallan    DC Planning Services  Other  Outpatient Services - Pt will follow up  PCP issues    Choice offered to / List presented to:            Status of service:  Completed, signed off  ED Comments:   ED Comments Detail:  CM spoke with pt who [place]cp. CM discussed and provided written information for self pay pcps, importance of pcp for f/u care, www.needymeds.org, www.goodrx.com, discounted pharmacies and other [place] resources such as [facility], [facility], affordable care act,  financial assistance, DSS and  health department  Reviewed resources for [place] self pay pcps like [facility], family medicine at [address], [facility], general medical clinics, [facility] plus others, medication resources, [facility] out patient pharmacies and housing Pt voiced understanding and appreciation of resources provided  Provided [facility] contact information

## 2014-07-09 NOTE — BH Assessment (Signed)
Writer completed the assessment of the patient.

## 2014-07-09 NOTE — ED Notes (Signed)
[facility].  He is pleasant but defensive on approach.  States he feels judged by staff who have known him from previous admits.  States he has been taking 7-8 Percocet 10 or oxycodone 30 per day.  UDS is negative, patient is aware.  Patient admits to suicidal thoughts with no intent or plan.  He was oriented to the unit and to offered food.

## 2014-07-09 NOTE — BH Assessment (Signed)
Writer informed of the consult.  

## 2014-07-09 NOTE — ED Notes (Signed)
Per pt, states he called police because he does not feel safe-addicted to pain pills-stays he is going through a lot-feels worthless-states drug habit is interfering in daily life

## 2014-07-09 NOTE — ED Notes (Signed)
Attempted to call report to [facility]  

## 2014-07-10 DIAGNOSIS — R45851 Suicidal ideations: Secondary | ICD-10-CM

## 2014-07-10 DIAGNOSIS — F1120 Opioid dependence, uncomplicated: Secondary | ICD-10-CM | POA: Diagnosis present

## 2014-07-10 DIAGNOSIS — R443 Hallucinations, unspecified: Secondary | ICD-10-CM

## 2014-07-10 LAB — CBG MONITORING, ED: Glucose-Capillary: 123 mg/dL — ABNORMAL HIGH (ref 70–99)

## 2014-07-10 MED ORDER — ZOLPIDEM TARTRATE 5 MG PO TABS: 5.0000 mg | ORAL_TABLET | Freq: Every evening | ORAL | Status: DC | PRN

## 2014-07-10 MED ORDER — SERTRALINE HCL 50 MG PO TABS
50.0000 mg | ORAL_TABLET | Freq: Every day | ORAL | Status: DC
Start: 2014-07-10 — End: 2014-07-11
  Administered 2014-07-10 – 2014-07-11 (×2): 50 mg via ORAL
  Filled 2014-07-10 (×2): qty 1

## 2014-07-10 MED ORDER — METFORMIN HCL 500 MG PO TABS
500.0000 mg | ORAL_TABLET | Freq: Two times a day (BID) | ORAL | Status: DC
  Administered 2014-07-10 – 2014-07-11 (×2): 500 mg via ORAL
  Filled 2014-07-10 (×4): qty 1

## 2014-07-10 NOTE — Consult Note (Signed)
[facility] Face-to-Face Psychiatry Consult   Reason for Consult:  Opiate dependence Referring Physician:  EDP Patient Identification: Richard [name] MRN:  [id number] Principal Diagnosis: Suicidal ideations Diagnosis:   Patient Active Problem List   Diagnosis Date Noted  . Opiate dependence [F11.20] 07/10/2014    Priority: High  . 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 Suicidal ideations [R45.851] 10/27/2013    Priority: High    Total Time spent with patient: 45 minutes  Subjective:   Richard [name] is a 44 y.o. male patient admitted with suicidal ideations and opiate dependence.  HPI:  The patient presented for opiate dependence with depression and suicidal ideations.  He states he has been abusing oxycodone and Percocets for ten years, despite all of his lab results being negative.  Richard [name] is upset because he and his ex-wife were getting back together until he found out that she is still seeing her old boyfriend.  Non-compliant with psychiatric treatments.  Denies homicidal ideations, hallucinations, and alcohol abuse.  He is currently living with his ex-father-in-law.  Richard [name] frequently comes to the ED with complaints of his ex-wife. HPI Elements:   Location:  generalized. Quality:  acute. Severity:  moderate. Timing:  intermittent. Duration:  few days. Context:  stressors.  Past Medical History:  Past Medical History  Diagnosis Date  . Diabetes mellitus   . Hypertension   . Diabetic neuropathy     Past Surgical History  Procedure Laterality Date  . Mouth surgery    . Tympanostomy tube placement     Family History: No family history on file. Social History:  History  Alcohol Use No     History  Drug Use  . Yes    Comment: opiates    History   Social History  . Marital Status: Married    Spouse Name: N/A    Number of Children: N/A  . Years of Education: N/A   Social History Main Topics  . Smoking status: Current Every  Day Smoker  . Smokeless tobacco: None  . Alcohol Use: No  . Drug Use: Yes     Comment: opiates  . Sexual Activity: None   Other Topics Concern  . None   Social History Narrative   Additional Social History:                          Allergies:  No Known Allergies  Vitals: Blood pressure 113/68, pulse 62, temperature 97.7 F (36.5 C), temperature source Oral, resp. rate 18, SpO2 100 %.  Risk to Self: Suicidal Ideation: Yes-Currently Present Suicidal Intent: Yes-Currently Present Is patient at risk for suicide?: Yes Suicidal Plan?: Yes-Currently Present Specify Current Suicidal Plan: Shot himself with a gun. Access to Means: No Specify Access to Suicidal Means: Shoting himself with a gun. What has been your use of drugs/alcohol within the last 12 months?: Opiates How many times?: 3 Other Self Harm Risks: None Reported Triggers for Past Attempts: Other (Comment) Intentional Self Injurious Behavior: None Risk to Others: Homicidal Ideation: Yes-Currently Present Thoughts of Harm to Others: Yes-Currently Present Comment - Thoughts of Harm to Others: Wanting to harm others who are doing better than he is.  Current Homicidal Intent: No-Not Currently/Within Last 6 Months Current Homicidal Plan: No Access to Homicidal Means: No Describe Access to Homicidal Means: None Reported Identified Victim: None Reported History of harm to others?: No Assessment of Violence: None Noted Violent Behavior Description: None  Reported Does patient have access to weapons?: No Criminal Charges Pending?: No Does patient have a court date: No Prior Inpatient Therapy: Prior Inpatient Therapy: Yes Prior Therapy Dates: 2015 Prior Therapy Facilty/Provider(s): Unable to remember the name of the facility. Reason for Treatment: SI Prior Outpatient Therapy: Prior Outpatient Therapy: No Prior Therapy Dates: None Reported Prior Therapy Facilty/Provider(s): None Reported Reason for Treatment:  NA  No current facility-administered medications for this encounter.   Current Outpatient Prescriptions  Medication Sig Dispense Refill  . benztropine (COGENTIN) 0.5 MG tablet Take 1 tablet (0.5 mg total) by mouth 2 (two) times daily. 60 tablet 0  . divalproex (DEPAKOTE ER) 500 MG 24 hr tablet Take 1 tablet (500 mg total) by mouth 3 (three) times daily before meals. 90 tablet 0  . metFORMIN (GLUCOPHAGE) 500 MG tablet Take 500 mg by mouth 2 (two) times daily with a meal.    . OVER THE COUNTER MEDICATION Take 1 tablet by mouth daily. Blood pressure.    . traZODone (DESYREL) 50 MG tablet Take 1 tablet (50 mg total) by mouth at bedtime. 30 tablet 0  . risperiDONE (RISPERDAL) 1 MG tablet Take 1 tablet (1 mg total) by mouth at bedtime. (Patient not taking: Reported on 07/09/2014) 30 tablet 0  . risperiDONE microspheres (RISPERDAL CONSTA) 25 MG injection Inject 2 mLs (25 mg total) into the muscle every 14 (fourteen) days. (Patient not taking: Reported on 07/09/2014) 1 each 3    Musculoskeletal: Strength & Muscle Tone: within normal limits Gait & Station: normal Patient leans: N/A  Psychiatric Specialty Exam:     Blood pressure 113/68, pulse 62, temperature 97.7 F (36.5 C), temperature source Oral, resp. rate 18, SpO2 100 %.There is no weight on file to calculate BMI.  General Appearance: Casual  Eye Contact::  Good  Speech:  Normal Rate  Volume:  Normal  Mood:  Depressed  Affect:  Congruent  Thought Process:  Coherent  Orientation:  Full (Time, Place, and Person)  Thought Content:  WDL  Suicidal Thoughts:  Yes.  without intent/plan  Homicidal Thoughts:  No  Memory:  Immediate;   Good Recent;   Good Remote;   Good  Judgement:  Fair  Insight:  Fair  Psychomotor Activity:  Normal  Concentration:  Fair  Recall:  Fair  Fund of Knowledge:Good  Language: Good  Akathisia:  No  Handed:  Right  AIMS (if indicated):     Assets:  Housing Leisure Time Physical Health Resilience Social  Support  ADL's:  Intact  Cognition: WNL  Sleep:      Medical Decision Making: Review of Psycho-Social Stressors (1) and Review of Medication Regimen & Side Effects (2)  Treatment Plan Summary: Daily contact with patient to assess and evaluate symptoms and progress in treatment, Medication management and Plan admit to inpatient unless patient stabilizes  Plan:  Recommend psychiatric Inpatient admission when medically cleared. Disposition: Admit to inpatient hospitalization unless patient stabilizes prior to bed placement.  Nanine MeansLORD, JAMISON, PMh-NP 07/10/2014 2:57 PM  Patient seen, evaluated and I agree with notes by Nurse Practitioner. Thedore MinsMojeed Edin Kon, MD

## 2014-07-10 NOTE — BH Assessment (Addendum)
[facility] Assessment Progress Note  The following facilities have been contacted in an effort to place this pt with results as noted:  Beds available, referral sent, decision pending[facility]   [facility]   Declined: [facility] (No [facility] beds available)  Doylene Canninghomas Earnesteen Birnie, MA Triage Specialist 07/10/2014 @ 16:15

## 2014-07-11 DIAGNOSIS — F1120 Opioid dependence, uncomplicated: Secondary | ICD-10-CM | POA: Insufficient documentation

## 2014-07-11 DIAGNOSIS — F322 Major depressive disorder, single episode, severe without psychotic features: Secondary | ICD-10-CM | POA: Insufficient documentation

## 2014-07-11 NOTE — Consult Note (Signed)
[facility]   Reason for Consult:  Opiate dependence Referring Physician:  EDP Patient Identification: Richard [name] MRN:  [id number] Principal Diagnosis: Suicidal ideations Diagnosis:   Patient Active Problem List   Diagnosis Date Noted  . Opiate dependence [F11.20] 07/10/2014    Priority: High  . 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 Suicidal ideations [R45.851] 10/27/2013    Priority: High    Total Time spent with patient: 30 minutes  Subjective:   Richard Reelddie Griffeth is a 44 y.o. male patient stabilized and will discharge home.  HPI:  The patient denies withdrawal symptoms, suicidal/homicidal ideations, and hallucinations.  He is stable to go home and follow-up with his regular providers, substance abuse resources given.  Link Snufferddie states he feels better after getting some sleep. HPI Elements:   Location:  generalized. Quality:  acute. Severity:  moderate. Timing:  intermittent. Duration:  few days. Context:  stressors.  Past Medical History:  Past Medical History  Diagnosis Date  . Diabetes mellitus   . Hypertension   . Diabetic neuropathy     Past Surgical History  Procedure Laterality Date  . Mouth surgery    . Tympanostomy tube placement     Family History: No family history on file. Social History:  History  Alcohol Use No     History  Drug Use  . Yes    Comment: opiates    History   Social History  . Marital Status: Married    Spouse Name: N/A    Number of Children: N/A  . Years of Education: N/A   Social History Main Topics  . Smoking status: Current Every Day Smoker  . Smokeless tobacco: None  . Alcohol Use: No  . Drug Use: Yes     Comment: opiates  . Sexual Activity: None   Other Topics Concern  . None   Social History Narrative   Additional Social History:                          Allergies:  No Known Allergies  Vitals: Blood pressure 142/84, pulse 65,  temperature 98.2 F (36.8 C), temperature source Oral, resp. rate 16, SpO2 98 %.  Risk to Self: Suicidal Ideation: Yes-Currently Present Suicidal Intent: Yes-Currently Present Is patient at risk for suicide?: Yes Suicidal Plan?: Yes-Currently Present Specify Current Suicidal Plan: Shot himself with a gun. Access to Means: No Specify Access to Suicidal Means: Shoting himself with a gun. What has been your use of drugs/alcohol within the last 12 months?: Opiates How many times?: 3 Other Self Harm Risks: None Reported Triggers for Past Attempts: Other (Comment) Intentional Self Injurious Behavior: None Risk to Others: Homicidal Ideation: Yes-Currently Present Thoughts of Harm to Others: Yes-Currently Present Comment - Thoughts of Harm to Others: Wanting to harm others who are doing better than he is.  Current Homicidal Intent: No-Not Currently/Within Last 6 Months Current Homicidal Plan: No Access to Homicidal Means: No Describe Access to Homicidal Means: None Reported Identified Victim: None Reported History of harm to others?: No Assessment of Violence: None Noted Violent Behavior Description: None Reported Does patient have access to weapons?: No Criminal Charges Pending?: No Does patient have a court date: No Prior Inpatient Therapy: Prior Inpatient Therapy: Yes Prior Therapy Dates: 2015 Prior Therapy Facilty/Provider(s): Unable to remember the name of the facility. Reason for Treatment: SI Prior Outpatient Therapy: Prior Outpatient Therapy: No Prior Therapy Dates: None  Reported Prior Therapy Facilty/Provider(s): None Reported Reason for Treatment: NA  Current Facility-Administered Medications  Medication Dose Route Frequency Provider Last Rate Last Dose  . metFORMIN (GLUCOPHAGE) tablet 500 mg  500 mg Oral BID WC Nanine Means, NP   500 mg at 07/11/14 0718  . sertraline (ZOLOFT) tablet 50 mg  50 mg Oral Daily Nanine Means, NP   50 mg at 07/11/14 1610  . zolpidem (AMBIEN)  tablet 5 mg  5 mg Oral QHS PRN Nanine Means, NP       Current Outpatient Prescriptions  Medication Sig Dispense Refill  . benztropine (COGENTIN) 0.5 MG tablet Take 1 tablet (0.5 mg total) by mouth 2 (two) times daily. 60 tablet 0  . divalproex (DEPAKOTE ER) 500 MG 24 hr tablet Take 1 tablet (500 mg total) by mouth 3 (three) times daily before meals. 90 tablet 0  . metFORMIN (GLUCOPHAGE) 500 MG tablet Take 500 mg by mouth 2 (two) times daily with a meal.    . OVER THE COUNTER MEDICATION Take 1 tablet by mouth daily. Blood pressure.    . traZODone (DESYREL) 50 MG tablet Take 1 tablet (50 mg total) by mouth at bedtime. 30 tablet 0  . risperiDONE (RISPERDAL) 1 MG tablet Take 1 tablet (1 mg total) by mouth at bedtime. (Patient not taking: Reported on 07/09/2014) 30 tablet 0  . risperiDONE microspheres (RISPERDAL CONSTA) 25 MG injection Inject 2 mLs (25 mg total) into the muscle every 14 (fourteen) days. (Patient not taking: Reported on 07/09/2014) 1 each 3    Musculoskeletal: Strength & Muscle Tone: within normal limits Gait & Station: normal Patient leans: N/A  Psychiatric Specialty Exam:     Blood pressure 142/84, pulse 65, temperature 98.2 F (36.8 C), temperature source Oral, resp. rate 16, SpO2 98 %.There is no weight on file to calculate BMI.  General Appearance: Casual  Eye Contact::  Good  Speech:  Normal Rate  Volume:  Normal  Mood:  Euthymic  Affect:  Congruent  Thought Process:  Coherent  Orientation:  Full (Time, Place, and Person)  Thought Content:  WDL  Suicidal Thoughts:  No  Homicidal Thoughts:  No  Memory:  Immediate;   Good Recent;   Good Remote;   Good  Judgement:  Fair  Insight:  Fair  Psychomotor Activity:  Normal  Concentration:  Good  Recall:  Good  Fund of Knowledge:Good  Language: Good  Akathisia:  No  Handed:  Right  AIMS (if indicated):     Assets:  Housing Leisure Time Physical Health Resilience Social Support  ADL's:  Intact  Cognition: WNL   Sleep:      Medical Decision Making: Review of Psycho-Social Stressors (1) and Review of Medication Regimen & Side Effects (2)  Treatment Plan Summary: Patient stabilized and will discharge.  Plan:  Discharge home and follow-up with his regular providers, substance abuse resources provided at discharge. Disposition: Discharge  Nanine Means, PMh-NP 07/11/2014 10:49 AM  Patient seen, evaluated and I agree with notes by Nurse Practitioner. Thedore Mins, MD

## 2014-07-11 NOTE — Discharge Instructions (Signed)
To help you maintain a sober lifestyle, a substance abuse treatment program may be beneficial to you.  Contact one of the following programs to see about obtaining treatment services:       [facility]      [address]      [place], [place] [address]      [phone]       [facility]      [address]      [place], [place] [address]      [phone]       [facility]      [address]      [place], [place] [address]      [phone]

## 2014-07-11 NOTE — BHH Suicide Risk Assessment (Signed)
Suicide Risk Assessment  Discharge Assessment   [facility]   Demographic Factors:  Male  Total Time spent with patient: 30 minutes  Musculoskeletal: Strength & Muscle Tone: within normal limits Gait & Station: normal Patient leans: N/A  Psychiatric Specialty Exam:     Blood pressure 142/84, pulse 65, temperature 98.2 F (36.8 C), temperature source Oral, resp. rate 16, SpO2 98 %.There is no weight on file to calculate BMI.  General Appearance: Casual  Eye Contact::  Good  Speech:  Normal Rate  Volume:  Normal  Mood:  Euthymic  Affect:  Congruent  Thought Process:  Coherent  Orientation:  Full (Time, Place, and Person)  Thought Content:  WDL  Suicidal Thoughts:  No  Homicidal Thoughts:  No  Memory:  Immediate;   Good Recent;   Good Remote;   Good  Judgement:  Fair  Insight:  Fair  Psychomotor Activity:  Normal  Concentration:  Good  Recall:  Good  Fund of Knowledge:Good  Language: Good  Akathisia:  No  Handed:  Right  AIMS (if indicated):     Assets:  Housing Leisure Time Physical Health Resilience Social Support  ADL's:  Intact  Cognition: WNL  Sleep:         Has this patient used any form of tobacco in the last 30 days? (Cigarettes, Smokeless Tobacco, Cigars, and/or Pipes) Yes, A prescription for an FDA-approved tobacco cessation medication was offered at discharge and the patient refused  Mental Status Per Nursing Assessment::   On Admission:   Opiate dependence, suicidal ideations  Current Mental Status by Physician: NA  Loss Factors: NA  Historical Factors: NA  Risk Reduction Factors:   Sense of responsibility to family, Living with another person, especially a relative, Positive social support and Positive therapeutic relationship  Continued Clinical Symptoms:  None  Cognitive Features That Contribute To Risk:  None    Suicide Risk:  Minimal: No identifiable suicidal ideation.  Patients presenting with no  risk factors but with morbid ruminations; may be classified as minimal risk based on the severity of the depressive symptoms  Principal Problem: Suicidal ideations Discharge Diagnoses:  Patient Active Problem List   Diagnosis Date Noted  . Opiate dependence [F11.20] 07/10/2014    Priority: High  . 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 Suicidal ideations [R45.851] 10/27/2013    Priority: High  . Uncomplicated opioid dependence [F11.20]   . Major depressive disorder, single episode, severe without psychotic features [F32.2]     Follow-up Information    Follow up with Resources provided by case manager .   Contact information:   Please review and schedule an appointment with a pcp (primary care provider) from the list of providers provided to you by emergency room case manager      Plan Of Care/Follow-up recommendations:  Activity:  as tolerated Diet:  heart healthy diet  Is patient on multiple antipsychotic therapies at discharge:  No   Has Patient had three or more failed trials of antipsychotic monotherapy by history:  No  Recommended Plan for Multiple Antipsychotic Therapies: NA    Shiquita Collignon, PMH-NP 07/11/2014, 10:53 AM

## 2014-07-11 NOTE — BH Assessment (Signed)
[facility]HH Assessment Progress Note  Per Thedore MinsMojeed Akintayo, MD, pt is to be discharged with referral information for [facility], [facility], and [facility].  He is to follow up on these referrals at his own initiative.  Information has been included in pt's discharge instructions.  Pt's nurse has been notified.  Doylene Canninghomas Leona Alen, MA Triage Specialist 07/11/2014 @ 09:36

## 2014-07-11 NOTE — BHH Counselor (Signed)
TTS Counselor contacted the following facilities in effort to place pt:  Beds available now or possibly in the morning, [facility]  Pending decision: [facility] - Doctor to review later in the AM on 2/3, per Milinda AntisPamela Rutherford - resent because they did not receive original referral, per Crystal  [facility]

## 2014-08-28 ENCOUNTER — Encounter (HOSPITAL_COMMUNITY): Payer: Self-pay | Admitting: Neurology

## 2014-08-28 ENCOUNTER — Emergency Department (HOSPITAL_COMMUNITY)
Admission: EM | Admit: 2014-08-28 | Discharge: 2014-08-28 | Discharge status: Against Medical Advice | Payer: Self-pay | Attending: Emergency Medicine | Admitting: Emergency Medicine

## 2014-08-28 DIAGNOSIS — M25551 Pain in right hip: Secondary | ICD-10-CM | POA: Insufficient documentation

## 2014-08-28 DIAGNOSIS — R45851 Suicidal ideations: Secondary | ICD-10-CM | POA: Insufficient documentation

## 2014-08-28 DIAGNOSIS — Z720 Tobacco use: Secondary | ICD-10-CM | POA: Insufficient documentation

## 2014-08-28 DIAGNOSIS — E1140 Type 2 diabetes mellitus with diabetic neuropathy, unspecified: Secondary | ICD-10-CM | POA: Insufficient documentation

## 2014-08-28 DIAGNOSIS — I10 Essential (primary) hypertension: Secondary | ICD-10-CM | POA: Insufficient documentation

## 2014-08-28 DIAGNOSIS — R45850 Homicidal ideations: Secondary | ICD-10-CM | POA: Insufficient documentation

## 2014-08-28 NOTE — ED Notes (Signed)
When RN walked out room, pt got up and left room, walked out the door. Attempt to get pt to stay, pt refused kept walking. Security paged.

## 2014-08-28 NOTE — ED Notes (Signed)
Pt reports SI and HI, doesn't want to talk with RN about his problems but reports he is SI and HI. Also right hip pain.

## 2014-08-30 ENCOUNTER — Emergency Department (HOSPITAL_COMMUNITY)
Admission: EM | Admit: 2014-08-30 | Discharge: 2014-08-31 | Disposition: A | Discharge status: Other Acute Inpatient Hospital | Payer: Self-pay | Attending: Emergency Medicine | Admitting: Emergency Medicine

## 2014-08-30 ENCOUNTER — Encounter (HOSPITAL_COMMUNITY): Payer: Self-pay | Admitting: Emergency Medicine

## 2014-08-30 DIAGNOSIS — I10 Essential (primary) hypertension: Secondary | ICD-10-CM | POA: Insufficient documentation

## 2014-08-30 DIAGNOSIS — F332 Major depressive disorder, recurrent severe without psychotic features: Secondary | ICD-10-CM | POA: Insufficient documentation

## 2014-08-30 DIAGNOSIS — Z79899 Other long term (current) drug therapy: Secondary | ICD-10-CM | POA: Insufficient documentation

## 2014-08-30 DIAGNOSIS — Z720 Tobacco use: Secondary | ICD-10-CM | POA: Insufficient documentation

## 2014-08-30 DIAGNOSIS — E1140 Type 2 diabetes mellitus with diabetic neuropathy, unspecified: Secondary | ICD-10-CM | POA: Insufficient documentation

## 2014-08-30 LAB — RAPID URINE DRUG SCREEN, HOSP PERFORMED
Amphetamines: NOT DETECTED
Barbiturates: NOT DETECTED
Benzodiazepines: NOT DETECTED
Cocaine: NOT DETECTED
Opiates: NOT DETECTED
Tetrahydrocannabinol: NOT DETECTED

## 2014-08-30 LAB — CBC
HCT: 44.5 % (ref 39.0–52.0)
Hemoglobin: 15.5 g/dL (ref 13.0–17.0)
MCH: 29.2 pg (ref 26.0–34.0)
MCHC: 34.8 g/dL (ref 30.0–36.0)
MCV: 84 fL (ref 78.0–100.0)
Platelets: 213 10*3/uL (ref 150–400)
RBC: 5.3 MIL/uL (ref 4.22–5.81)
RDW: 12.4 % (ref 11.5–15.5)
WBC: 5.9 10*3/uL (ref 4.0–10.5)

## 2014-08-30 LAB — COMPREHENSIVE METABOLIC PANEL
ALT: 21 U/L (ref 0–53)
AST: 24 U/L (ref 0–37)
Albumin: 4.3 g/dL (ref 3.5–5.2)
Alkaline Phosphatase: 94 U/L (ref 39–117)
Anion gap: 10 (ref 5–15)
BUN: 11 mg/dL (ref 6–23)
CO2: 25 mmol/L (ref 19–32)
Calcium: 9.4 mg/dL (ref 8.4–10.5)
Chloride: 106 mmol/L (ref 96–112)
Creatinine, Ser: 0.87 mg/dL (ref 0.50–1.35)
GFR calc Af Amer: >90 mL/min (ref >90)
GFR calc non Af Amer: >90 mL/min (ref >90)
Glucose, Bld: 99 mg/dL (ref 70–99)
Potassium: 3.8 mmol/L (ref 3.5–5.1)
Sodium: 141 mmol/L (ref 135–145)
Total Bilirubin: 1.1 mg/dL (ref 0.3–1.2)
Total Protein: 8 g/dL (ref 6.0–8.3)

## 2014-08-30 LAB — SALICYLATE LEVEL: Salicylate Lvl: <4 mg/dL (ref 2.8–20.0)

## 2014-08-30 LAB — ETHANOL: Alcohol, Ethyl (B): <5 mg/dL (ref 0–9)

## 2014-08-30 LAB — ACETAMINOPHEN LEVEL: Acetaminophen (Tylenol), Serum: <10 ug/mL — ABNORMAL LOW (ref 10–30)

## 2014-08-30 MED ORDER — RISPERIDONE MICROSPHERES 25 MG IM SUSR
25.0000 mg | INTRAMUSCULAR | Status: DC
  Administered 2014-08-30: 25 mg via INTRAMUSCULAR
  Filled 2014-08-30: qty 2

## 2014-08-30 MED ORDER — BENZTROPINE MESYLATE 1 MG PO TABS
0.5000 mg | ORAL_TABLET | Freq: Two times a day (BID) | ORAL | Status: DC
  Administered 2014-08-30 – 2014-08-31 (×2): 0.5 mg via ORAL
  Filled 2014-08-30 (×2): qty 1

## 2014-08-30 MED ORDER — TRAZODONE HCL 50 MG PO TABS
50.0000 mg | ORAL_TABLET | Freq: Every day | ORAL | Status: DC
  Administered 2014-08-30: 50 mg via ORAL
  Filled 2014-08-30: qty 1

## 2014-08-30 MED ORDER — DIVALPROEX SODIUM ER 500 MG PO TB24
500.0000 mg | ORAL_TABLET | Freq: Three times a day (TID) | ORAL | Status: DC
  Administered 2014-08-31 (×2): 500 mg via ORAL
  Filled 2014-08-30 (×4): qty 1

## 2014-08-30 MED ORDER — RISPERIDONE 1 MG PO TABS
1.0000 mg | ORAL_TABLET | Freq: Every day | ORAL | Status: DC
  Administered 2014-08-30: 1 mg via ORAL
  Filled 2014-08-30: qty 1

## 2014-08-30 NOTE — ED Notes (Signed)
Pt brought in by [employer] for suicidal attempt of running into traffic. Pt states his wife recently left him, that he has auditory hallucinations of God and Demons telling him to do things. Pt states homicidal thoughts towards anyone who appears happy because that "sets me off," and angers him. Pt states that he might like to hurt a lot of people and kill himself so he won't be arrested. Pt calm and cooperative currently.

## 2014-08-30 NOTE — ED Notes (Signed)
MD at bedside. 

## 2014-08-30 NOTE — BHH Counselor (Signed)
TTS Counselor reviewed pt chart and MD notes in [facility]. Counselor spoke with current attending RN, Latricia, and asked tele-assessment cart to be placed in room.  Assessment to begin shortly.   Cyndie MullAnna Latiesha Harada, [facility] Triage Specialist [phone]

## 2014-08-30 NOTE — ED Notes (Signed)
Patient and belongings wanded 

## 2014-08-30 NOTE — ED Notes (Signed)
Officer Solon AugustaSadie McDonald offers her number of [phone] if MD has questions regarding case as she was initial officer involved.

## 2014-08-30 NOTE — ED Notes (Signed)
edp currently talking to pt....delay on lab draw
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
## 2014-08-30 NOTE — ED Notes (Signed)
Pt sleeping at present, no distress noted, monitoring for safety, Q15 min checks in effect. 

## 2014-08-30 NOTE — ED Notes (Signed)
Pt presents with complaint of SI, attempted to run into traffic.  Pt reports he is SI, HI, depressed and hopeless.  Pt reports he is estranged from wife for the past 2 years, states they were together for 18 years, and states his life is falling apart.  Pt admits to auditory hallucinations of Demons and God.  Pt calm,cooperative, denies street drug or alcohol use. AAO x 3, no distress noted, monitoring for safety, Q 15 min checks in effect.

## 2014-08-31 ENCOUNTER — Inpatient Hospital Stay: Payer: Self-pay | Admitting: Psychiatry

## 2014-08-31 DIAGNOSIS — F332 Major depressive disorder, recurrent severe without psychotic features: Secondary | ICD-10-CM

## 2014-08-31 DIAGNOSIS — R45851 Suicidal ideations: Secondary | ICD-10-CM

## 2014-08-31 LAB — CBG MONITORING, ED
Glucose-Capillary: 145 mg/dL — ABNORMAL HIGH (ref 70–99)
Glucose-Capillary: 103 mg/dL — ABNORMAL HIGH (ref 70–99)

## 2014-08-31 MED ORDER — METFORMIN HCL 500 MG PO TABS
500.0000 mg | ORAL_TABLET | Freq: Two times a day (BID) | ORAL | Status: DC
  Administered 2014-08-31: 500 mg via ORAL
  Filled 2014-08-31 (×3): qty 1

## 2014-08-31 NOTE — BHH Counselor (Signed)
Inpt treatment recommended by Hulan FessIjeoma Nwaeze, NP. No appropriate B[facility] beds per Adult Unit.   TTS to seek placement.    Cyndie MullAnna Kimmberly Wisser, [facility] Triage Specialist

## 2014-08-31 NOTE — BH Assessment (Signed)
[facility] [place] [facility] [place] MD accepts pt. # for report is [phone]. Jerilynn SomCalvin will fax TTS the [facility] voluntary consent which will need to be signed by pt and [facility] to [facility].   Evette Cristalaroline Paige Lovelyn Sheeran, [place] Therapeutic Triage Specialist

## 2014-08-31 NOTE — ED Notes (Signed)
Pt sleeping at present, no distress noted, monitoring for safety, Q15 min checks in effect. 

## 2014-08-31 NOTE — Consult Note (Signed)
[facility] Face-to-Face Psychiatry Consult   Reason for Consult:  Mr Koeppen says he is feeling suicidal again Referring Physician:  ED MD Patient Identification: Richard [name] MRN:  [id number] Principal Diagnosis: Major depression, recurrent severe without psychosis Diagnosis:   Patient Active Problem List   Diagnosis Date Noted  . Uncomplicated opioid dependence [F11.20]   . Major depressive disorder, single episode, severe without psychotic features [F32.2]   . Opiate dependence [F11.20] 07/10/2014  . Severe major depression without psychotic features [F32.2] 01/22/2014  . Suicidal ideations [R45.851] 10/27/2013    Total Time spent with patient: 30 minutes  Subjective:   Richard [name] is a 44 y.o. male patient admitted with suicidal thoughts with intent.  HPI:  Mr Kandler is well known to the ED with the same reported struggle with the loss of his wife to another man 2 years ago.  She has since had a baby with him.  He says he knows everybody tells him he should be over it but he just cannot let it go.  He has not gotten any therapy but says the medicines do not do much.  Today he is still suicidal and cannot contract for safety.  No precipitants he said just built up again.  No drugs in his system this time. HPI Elements:   Location:  depression. Quality:  suicidal thoughts. Severity:  cannot contract for safety. Timing:  no precipitants related. Duration:  2 years at least. Context:  as above.  Past Medical History:  Past Medical History  Diagnosis Date  . Diabetes mellitus   . Hypertension   . Diabetic neuropathy     Past Surgical History  Procedure Laterality Date  . Mouth surgery    . Tympanostomy tube placement     Family History: History reviewed. No pertinent family history. Social History:  History  Alcohol Use No     History  Drug Use  . Yes    Comment: opiates    History   Social History  . Marital Status: Married    Spouse Name: N/A  . Number of Children: N/A   . Years of Education: N/A   Social History Main Topics  . Smoking status: Current Every Day Smoker  . Smokeless tobacco: Not on file  . Alcohol Use: No  . Drug Use: Yes     Comment: opiates  . Sexual Activity: Not on file   Other Topics Concern  . None   Social History Narrative   Additional Social History:    Pain Medications: Reports hx of Rx pain pill abuse Prescriptions: See PTA List Over the Counter: See PTA List History of alcohol / drug use?: Yes Longest period of sobriety (when/how long): UTA Negative Consequences of Use: Personal relationships Withdrawal Symptoms:  (Pt denies) Name of Substance 1: Pain pills (Oxycodone, etc.) 1 - Age of First Use: 33 1 - Amount (size/oz): 13 pills daily at the most 1 - Frequency: Previously regularly; Now pt reports occasional use "once every few weeks" 1 - Duration: 10 years 1 - Last Use / Amount: 1 week ago                   Allergies:  No Known Allergies  Labs:  Results for orders placed or performed during the hospital encounter of 08/30/14 (from the past 48 hour(s))  Urine Drug Screen     Status: None   Collection Time: 08/30/14  6:18 PM  Result Value Ref Range   Opiates NONE DETECTED NONE  DETECTED   Cocaine NONE DETECTED NONE DETECTED   Benzodiazepines NONE DETECTED NONE DETECTED   Amphetamines NONE DETECTED NONE DETECTED   Tetrahydrocannabinol NONE DETECTED NONE DETECTED   Barbiturates NONE DETECTED NONE DETECTED    Comment:        DRUG SCREEN FOR MEDICAL PURPOSES ONLY.  IF CONFIRMATION IS NEEDED FOR ANY PURPOSE, NOTIFY LAB WITHIN 5 DAYS.        LOWEST DETECTABLE LIMITS FOR URINE DRUG SCREEN Drug Class       Cutoff (ng/mL) Amphetamine      1000 Barbiturate      200 Benzodiazepine   030 Tricyclics       092 Opiates          300 Cocaine          300 THC              50   Acetaminophen level     Status: Abnormal   Collection Time: 08/30/14  7:24 PM  Result Value Ref Range   Acetaminophen  (Tylenol), Serum <10.0 (L) 10 - 30 ug/mL    Comment:        THERAPEUTIC CONCENTRATIONS VARY SIGNIFICANTLY. A RANGE OF 10-30 ug/mL MAY BE AN EFFECTIVE CONCENTRATION FOR MANY PATIENTS. HOWEVER, SOME ARE BEST TREATED AT CONCENTRATIONS OUTSIDE THIS RANGE. ACETAMINOPHEN CONCENTRATIONS >150 ug/mL AT 4 HOURS AFTER INGESTION AND >50 ug/mL AT 12 HOURS AFTER INGESTION ARE OFTEN ASSOCIATED WITH TOXIC REACTIONS.   CBC     Status: None   Collection Time: 08/30/14  7:24 PM  Result Value Ref Range   WBC 5.9 4.0 - 10.5 K/uL   RBC 5.30 4.22 - 5.81 MIL/uL   Hemoglobin 15.5 13.0 - 17.0 g/dL   HCT 44.5 39.0 - 52.0 %   MCV 84.0 78.0 - 100.0 fL   MCH 29.2 26.0 - 34.0 pg   MCHC 34.8 30.0 - 36.0 g/dL   RDW 12.4 11.5 - 15.5 %   Platelets 213 150 - 400 K/uL  Comprehensive metabolic panel     Status: None   Collection Time: 08/30/14  7:24 PM  Result Value Ref Range   Sodium 141 135 - 145 mmol/L   Potassium 3.8 3.5 - 5.1 mmol/L   Chloride 106 96 - 112 mmol/L   CO2 25 19 - 32 mmol/L   Glucose, Bld 99 70 - 99 mg/dL   BUN 11 6 - 23 mg/dL   Creatinine, Ser 0.87 0.50 - 1.35 mg/dL   Calcium 9.4 8.4 - 10.5 mg/dL   Total Protein 8.0 6.0 - 8.3 g/dL   Albumin 4.3 3.5 - 5.2 g/dL   AST 24 0 - 37 U/L   ALT 21 0 - 53 U/L   Alkaline Phosphatase 94 39 - 117 U/L   Total Bilirubin 1.1 0.3 - 1.2 mg/dL   GFR calc non Af Amer >90 >90 mL/min   GFR calc Af Amer >90 >90 mL/min    Comment: (NOTE) The eGFR has been calculated using the CKD EPI equation. This calculation has not been validated in all clinical situations. eGFR's persistently <90 mL/min signify possible Chronic Kidney Disease.    Anion gap 10 5 - 15  Ethanol (ETOH)     Status: None   Collection Time: 08/30/14  7:24 PM  Result Value Ref Range   Alcohol, Ethyl (B) <5 0 - 9 mg/dL    Comment:        LOWEST DETECTABLE LIMIT FOR SERUM ALCOHOL IS 11 mg/dL FOR MEDICAL PURPOSES  ONLY   Salicylate level     Status: None   Collection Time: 08/30/14  7:24  PM  Result Value Ref Range   Salicylate Lvl <9.3 2.8 - 20.0 mg/dL  CBG monitoring, ED     Status: Abnormal   Collection Time: 08/31/14  9:56 AM  Result Value Ref Range   Glucose-Capillary 145 (H) 70 - 99 mg/dL    Vitals: Blood pressure 102/60, pulse 83, temperature 97.8 F (36.6 C), temperature source Oral, resp. rate 16, SpO2 97 %.  Risk to Self: Suicidal Ideation: Yes-Currently Present Suicidal Intent: Yes-Currently Present Is patient at risk for suicide?: Yes Suicidal Plan?: Yes-Currently Present Specify Current Suicidal Plan: Pt found by [employer] trying to run into traffic this evening Access to Means: Yes Specify Access to Suicidal Means: Roads; Also expressed access to gun if he "wanted to get one" What has been your use of drugs/alcohol within the last 12 months?: Opiates - reported intermittent use of pain pills How many times?: 3 Other Self Harm Risks: None known Triggers for Past Attempts: Spouse contact Intentional Self Injurious Behavior: None Risk to Others: Homicidal Ideation: Yes-Currently Present Thoughts of Harm to Others: Yes-Currently Present Comment - Thoughts of Harm to Others: Wanting to "shoot up" some place so people can feel the same pain he is in Current Homicidal Intent: Yes-Currently Present Current Homicidal Plan: Yes-Currently Present Describe Current Homicidal Plan: Shooting spree Access to Homicidal Means: Yes Describe Access to Homicidal Means: Pt does not report owning a gun but says he would know how to gain access to one Identified Victim: None in particular, "Strangers" History of harm to others?: No Assessment of Violence: None Noted Violent Behavior Description: None reported Does patient have access to weapons?: No Criminal Charges Pending?: No Does patient have a court date: No Prior Inpatient Therapy: Prior Inpatient Therapy: Yes Prior Therapy Dates: 2015 (2x) Prior Therapy Facilty/Provider([facility] Reason for Treatment: SI/HI Prior  Outpatient Therapy: Prior Outpatient Therapy: Yes Prior Therapy Dates: Current Prior Therapy Facilty/Provider(s): [facility] Reason for Treatment: Med mgmt  Current Facility-Administered Medications  Medication Dose Route Frequency Provider Last Rate Last Dose  . benztropine (COGENTIN) tablet 0.5 mg  0.5 mg Oral BID Virgel Manifold, MD   0.5 mg at 08/31/14 2671  . divalproex (DEPAKOTE ER) 24 hr tablet 500 mg  500 mg Oral TID AC Virgel Manifold, MD   500 mg at 08/31/14 2458  . risperiDONE (RISPERDAL) tablet 1 mg  1 mg Oral QHS Virgel Manifold, MD   1 mg at 08/30/14 2129  . risperiDONE microspheres (RISPERDAL CONSTA) injection 25 mg  25 mg Intramuscular Q14 Days Virgel Manifold, MD   25 mg at 08/30/14 2118  . traZODone (DESYREL) tablet 50 mg  50 mg Oral QHS Virgel Manifold, MD   50 mg at 08/30/14 2129   Current Outpatient Prescriptions  Medication Sig Dispense Refill  . benztropine (COGENTIN) 0.5 MG tablet Take 1 tablet (0.5 mg total) by mouth 2 (two) times daily. 60 tablet 0  . divalproex (DEPAKOTE ER) 500 MG 24 hr tablet Take 1 tablet (500 mg total) by mouth 3 (three) times daily before meals. 90 tablet 0  . risperiDONE (RISPERDAL) 1 MG tablet Take 1 tablet (1 mg total) by mouth at bedtime. (Patient not taking: Reported on 07/09/2014) 30 tablet 0  . risperiDONE microspheres (RISPERDAL CONSTA) 25 MG injection Inject 2 mLs (25 mg total) into the muscle every 14 (fourteen) days. (Patient not taking: Reported on 07/09/2014) 1 each 3  .  traZODone (DESYREL) 50 MG tablet Take 1 tablet (50 mg total) by mouth at bedtime. (Patient not taking: Reported on 08/30/2014) 30 tablet 0    Musculoskeletal: Strength & Muscle Tone: within normal limits Gait & Station: normal Patient leans: Right  Psychiatric Specialty Exam: Physical Exam  ROS  Blood pressure 102/60, pulse 83, temperature 97.8 F (36.6 C), temperature source Oral, resp. rate 16, SpO2 97 %.There is no weight on file to calculate BMI.  General Appearance:  Casual  Eye Contact::  Good  Speech:  Clear and Coherent  Volume:  Normal  Mood:  Depressed  Affect:  Congruent  Thought Process:  Coherent and Logical  Orientation:  Full (Time, Place, and Person)  Thought Content:  Negative  Suicidal Thoughts:  Yes.  with intent/plan  Homicidal Thoughts:  No  Memory:  Immediate;   Good Recent;   Good Remote;   Good  Judgement:  Fair  Insight:  Shallow  Psychomotor Activity:  Normal  Concentration:  Good  Recall:  Good  Fund of Knowledge:Good  Language: Good  Akathisia:  Negative  Handed:  Right  AIMS (if indicated):     Assets:  Communication Skills Desire for Improvement Housing Physical Health Talents/Skills Transportation Vocational/Educational  ADL's:  Intact  Cognition: WNL  Sleep:      Medical Decision Making: Established Problem, Worsening (2)  Treatment Plan Summary: transfer to psychiatric inpatient when a bed can be found  Plan:  Recommend psychiatric Inpatient admission when medically cleared. Disposition: Has been accepted to [facility] [facility] 08/31/2014 11:29 AM

## 2014-08-31 NOTE — BH Assessment (Signed)
Seeking placement as there are no 500 hall beds currently available. [facility]: [facility], [facility], [facility], [facility]    Richard [name], [place] Triage Specialist 08/31/2014 5:54 AM

## 2014-08-31 NOTE — BH Assessment (Signed)
[facility] Assessment Progress Note  This Clinical research associatewriter has spoken to this pt regarding transfer to [facility].  He is verbally agreeable to being admitted there.  Pt's nurse, Marylu LundJanet, has been notified.  Doylene Canninghomas Smaran Gaus, MA Triage Specialist 08/31/2014 @ 12:49
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
## 2014-09-01 LAB — BEHAVIORAL MEDICINE 1 PANEL
ALBUMIN: 3.9 g/dL
ALT: 17 U/L
AST: 21 U/L
Alkaline Phosphatase: 87 U/L
Anion Gap: 7 (ref 7–16)
BASOS ABS: 0 10*3/uL (ref 0.0–0.1)
BASOS PCT: 0.5 %
BUN: 12 mg/dL
Bilirubin,Total: 0.9 mg/dL
CALCIUM: 9.4 mg/dL
CO2: 28 mmol/L
Chloride: 106 mmol/L
Creatinine: 0.86 mg/dL
EGFR (African American): >60
EOS ABS: 0.2 10*3/uL (ref 0.0–0.7)
Eosinophil %: 3.9 %
GLUCOSE: 118 mg/dL — AB
HCT: 42.8 % (ref 40.0–52.0)
HGB: 14.2 g/dL (ref 13.0–18.0)
Lymphocyte #: 2.2 10*3/uL (ref 1.0–3.6)
Lymphocyte %: 53.8 %
MCH: 28.4 pg (ref 26.0–34.0)
MCHC: 33 g/dL (ref 32.0–36.0)
MCV: 86 fL (ref 80–100)
MONO ABS: 0.2 x10 3/mm (ref 0.2–1.0)
Monocyte %: 4.9 %
Neutrophil #: 1.5 10*3/uL (ref 1.4–6.5)
Neutrophil %: 36.9 %
Platelet: 186 10*3/uL (ref 150–440)
Potassium: 3.8 mmol/L
RBC: 4.98 10*6/uL (ref 4.40–5.90)
RDW: 12.9 % (ref 11.5–14.5)
Sodium: 141 mmol/L
TOTAL PROTEIN: 7.3 g/dL
Thyroid Stimulating Horm: 1.264 u[IU]/mL
WBC: 4.1 10*3/uL (ref 3.8–10.6)

## 2014-09-01 LAB — URINALYSIS, COMPLETE
Bacteria: NONE SEEN
Bilirubin,UR: NEGATIVE
Glucose,UR: NEGATIVE mg/dL (ref 0–75)
KETONE: NEGATIVE
LEUKOCYTE ESTERASE: NEGATIVE
Nitrite: NEGATIVE
PH: 5 (ref 4.5–8.0)
PROTEIN: NEGATIVE
Specific Gravity: 1.023 (ref 1.003–1.030)
Squamous Epithelial: NONE SEEN

## 2014-09-01 LAB — DRUG SCREEN, URINE
Amphetamines, Ur Screen: NEGATIVE
BENZODIAZEPINE, UR SCRN: NEGATIVE
Barbiturates, Ur Screen: NEGATIVE
Cannabinoid 50 Ng, Ur ~~LOC~~: NEGATIVE
Cocaine Metabolite,Ur ~~LOC~~: NEGATIVE
MDMA (ECSTASY) UR SCREEN: NEGATIVE
METHADONE, UR SCREEN: NEGATIVE
Opiate, Ur Screen: NEGATIVE
PHENCYCLIDINE (PCP) UR S: NEGATIVE
Tricyclic, Ur Screen: NEGATIVE
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
## 2014-10-01 ENCOUNTER — Encounter (HOSPITAL_COMMUNITY): Payer: Self-pay | Admitting: Emergency Medicine

## 2014-10-01 ENCOUNTER — Emergency Department (HOSPITAL_COMMUNITY)
Admission: EM | Admit: 2014-10-01 | Discharge: 2014-10-01 | Disposition: A | Discharge status: Home / Self Care | Payer: Self-pay | Attending: Emergency Medicine | Admitting: Emergency Medicine

## 2014-10-01 DIAGNOSIS — I10 Essential (primary) hypertension: Secondary | ICD-10-CM | POA: Insufficient documentation

## 2014-10-01 DIAGNOSIS — E1140 Type 2 diabetes mellitus with diabetic neuropathy, unspecified: Secondary | ICD-10-CM | POA: Insufficient documentation

## 2014-10-01 DIAGNOSIS — Z79899 Other long term (current) drug therapy: Secondary | ICD-10-CM | POA: Insufficient documentation

## 2014-10-01 DIAGNOSIS — Z720 Tobacco use: Secondary | ICD-10-CM | POA: Insufficient documentation

## 2014-10-01 DIAGNOSIS — F322 Major depressive disorder, single episode, severe without psychotic features: Secondary | ICD-10-CM | POA: Diagnosis present

## 2014-10-01 DIAGNOSIS — R45851 Suicidal ideations: Secondary | ICD-10-CM

## 2014-10-01 LAB — ACETAMINOPHEN LEVEL: Acetaminophen (Tylenol), Serum: <10 ug/mL — ABNORMAL LOW (ref 10–30)

## 2014-10-01 LAB — CBC
HCT: 42.5 % (ref 39.0–52.0)
Hemoglobin: 14.5 g/dL (ref 13.0–17.0)
MCH: 28.7 pg (ref 26.0–34.0)
MCHC: 34.1 g/dL (ref 30.0–36.0)
MCV: 84.2 fL (ref 78.0–100.0)
Platelets: 188 10*3/uL (ref 150–400)
RBC: 5.05 MIL/uL (ref 4.22–5.81)
RDW: 12.9 % (ref 11.5–15.5)
WBC: 5.5 10*3/uL (ref 4.0–10.5)

## 2014-10-01 LAB — SALICYLATE LEVEL

## 2014-10-01 LAB — COMPREHENSIVE METABOLIC PANEL
ALT: 31 U/L (ref 0–53)
AST: 28 U/L (ref 0–37)
Albumin: 4 g/dL (ref 3.5–5.2)
Alkaline Phosphatase: 95 U/L (ref 39–117)
Anion gap: 6 (ref 5–15)
BILIRUBIN TOTAL: 0.7 mg/dL (ref 0.3–1.2)
BUN: 8 mg/dL (ref 6–23)
CO2: 26 mmol/L (ref 19–32)
CREATININE: 0.88 mg/dL (ref 0.50–1.35)
Calcium: 9 mg/dL (ref 8.4–10.5)
Chloride: 110 mmol/L (ref 96–112)
GFR calc Af Amer: >90 mL/min (ref >90)
GFR calc non Af Amer: >90 mL/min (ref >90)
GLUCOSE: 150 mg/dL — AB (ref 70–99)
Potassium: 3.5 mmol/L (ref 3.5–5.1)
Sodium: 142 mmol/L (ref 135–145)
TOTAL PROTEIN: 7.6 g/dL (ref 6.0–8.3)

## 2014-10-01 LAB — RAPID URINE DRUG SCREEN, HOSP PERFORMED
Amphetamines: NOT DETECTED
Barbiturates: NOT DETECTED
Benzodiazepines: NOT DETECTED
Cocaine: NOT DETECTED
Opiates: NOT DETECTED
TETRAHYDROCANNABINOL: NOT DETECTED

## 2014-10-01 LAB — ETHANOL

## 2014-10-01 LAB — CBG MONITORING, ED: Glucose-Capillary: 123 mg/dL — ABNORMAL HIGH (ref 70–99)

## 2014-10-01 MED ORDER — LORAZEPAM 1 MG PO TABS: 1.0000 mg | ORAL_TABLET | Freq: Three times a day (TID) | ORAL | Status: DC | PRN

## 2014-10-01 MED ORDER — INSULIN ASPART 100 UNIT/ML ~~LOC~~ SOLN
0.0000 [IU] | Freq: Three times a day (TID) | SUBCUTANEOUS | Status: DC
Start: 2014-10-01 — End: 2014-10-01

## 2014-10-01 MED ORDER — BENZTROPINE MESYLATE 1 MG PO TABS: 0.5000 mg | ORAL_TABLET | Freq: Two times a day (BID) | ORAL | Status: DC

## 2014-10-01 MED ORDER — NICOTINE 21 MG/24HR TD PT24: 21.0000 mg | MEDICATED_PATCH | Freq: Every day | TRANSDERMAL | Status: DC

## 2014-10-01 MED ORDER — ACETAMINOPHEN 325 MG PO TABS: 650.0000 mg | ORAL_TABLET | ORAL | Status: DC | PRN

## 2014-10-01 MED ORDER — IBUPROFEN 200 MG PO TABS: 600.0000 mg | ORAL_TABLET | Freq: Three times a day (TID) | ORAL | Status: DC | PRN

## 2014-10-01 MED ORDER — RISPERIDONE 1 MG PO TABS: 1.0000 mg | ORAL_TABLET | Freq: Every day | ORAL | Status: DC

## 2014-10-01 MED ORDER — DIVALPROEX SODIUM ER 500 MG PO TB24
500.0000 mg | ORAL_TABLET | Freq: Three times a day (TID) | ORAL | Status: DC
  Filled 2014-10-01 (×4): qty 1

## 2014-10-01 MED ORDER — ALUM & MAG HYDROXIDE-SIMETH 200-200-20 MG/5ML PO SUSP: 30.0000 mL | ORAL | Status: DC | PRN

## 2014-10-01 MED ORDER — ONDANSETRON HCL 4 MG PO TABS: 4.0000 mg | ORAL_TABLET | Freq: Three times a day (TID) | ORAL | Status: DC | PRN

## 2014-10-01 NOTE — ED Notes (Signed)
Pt flat affect and states he is going to kill himself. Pt request we let him go and asked what happens if he just walks out. Pt informed he is involuntarily committed and cannot leave.

## 2014-10-01 NOTE — ED Notes (Signed)
Pt comes via [employer], pt called 911 saying he wanted to commit suicide, [place] stating he wanted to go see his dad. Pt being IVC by [employer].

## 2014-10-01 NOTE — Consult Note (Signed)
[facility] Face-to-Face Psychiatry Consult   Reason for Consult: Major Depressive dosrder Referring Physician: EDP Patient Identification: Richard [name] MRN:  [id number] Principal Diagnosis: Major depressive disorder, single episode, severe without psychotic features Diagnosis:   Patient Active Problem List   Diagnosis Date Noted  . Major depressive disorder, single episode, severe without psychotic features [F32.2]     Priority: High  . Uncomplicated opioid dependence [F11.20]   . Opiate dependence [F11.20] 07/10/2014  . Severe major depression without psychotic features [F32.2] 01/22/2014  . Suicidal ideations [R45.851] 10/27/2013    Total Time spent with patient: 1 hour  Subjective:   Richard [name] is a 44 y.o. male patient admitted with Major depression.  HPI:  AA male was evaluated for exacerbation of major depression.  He reported feeling suicidal yesterday because he cannot see his daughters.  He reports feeling hopeless and helpless and cannot  See his kids. Patient reports that he is still married to his wife but is separated from her.  Patient stated that he thought about his frequent visits to the hospital and suicidal thoughts.  Patient is homeless and unemployed.  Patient has agreed to to go home and get counseling.  He denies SI/HI/AVH.   He is being discharged now.  We will discharge him to follow up with [facility] for counseling.   He has agreed to take his Bipolar medications as prescribed and hoping to stabilize and be able to see his children.Marland Kitchen  HPI Elements:   Location:  Bipolar disorder, depressed, Major depressive disorder, recurrent depressed. Quality:  severe. Severity:  severe. Timing:  Acute. Duration:  Chronic mental illness. Context:  IVC BY [employer] for wanting to jump overa Bridge.  Past Medical History:  Past Medical History  Diagnosis Date  . Diabetes mellitus   . Hypertension   . Diabetic neuropathy     Past Surgical History  Procedure Laterality Date  . Mouth  surgery    . Tympanostomy tube placement     Family History: History reviewed. No pertinent family history. Social History:  History  Alcohol Use No     History  Drug Use  . Yes    Comment: opiates    History   Social History  . Marital Status: Single    Spouse Name: N/A  . Number of Children: N/A  . Years of Education: N/A   Social History Main Topics  . Smoking status: Current Every Day Smoker  . Smokeless tobacco: Not on file  . Alcohol Use: No  . Drug Use: Yes     Comment: opiates  . Sexual Activity: Not on file   Other Topics Concern  . None   Social History Narrative   Additional Social History:    History of alcohol / drug use?: Yes Longest period of sobriety (when/how long): UTA Negative Consequences of Use: Personal relationships Name of Substance 1: Pain pills (Oxycodone, etc.) 1 - Age of First Use: 33 1 - Amount (size/oz): 13 pills daily at the most 1 - Frequency: Previously regularly; Now pt reports occasional use "once every few weeks" 1 - Duration: 10 years 1 - Last Use / Amount: March 2016                   Allergies:  No Known Allergies  Labs:  Results for orders placed or performed during the hospital encounter of 10/01/14 (from the past 48 hour(s))  Acetaminophen level     Status: Abnormal   Collection Time: 10/01/14  2:01 AM  Result Value Ref Range   Acetaminophen (Tylenol), Serum <10.0 (L) 10 - 30 ug/mL    Comment:        THERAPEUTIC CONCENTRATIONS VARY SIGNIFICANTLY. A RANGE OF 10-30 ug/mL MAY BE AN EFFECTIVE CONCENTRATION FOR MANY PATIENTS. HOWEVER, SOME ARE BEST TREATED AT CONCENTRATIONS OUTSIDE THIS RANGE. ACETAMINOPHEN CONCENTRATIONS >150 ug/mL AT 4 HOURS AFTER INGESTION AND >50 ug/mL AT 12 HOURS AFTER INGESTION ARE OFTEN ASSOCIATED WITH TOXIC REACTIONS.   CBC     Status: None   Collection Time: 10/01/14  2:01 AM  Result Value Ref Range   WBC 5.5 4.0 - 10.5 K/uL   RBC 5.05 4.22 - 5.81 MIL/uL   Hemoglobin 14.5  13.0 - 17.0 g/dL   HCT 42.5 39.0 - 52.0 %   MCV 84.2 78.0 - 100.0 fL   MCH 28.7 26.0 - 34.0 pg   MCHC 34.1 30.0 - 36.0 g/dL   RDW 12.9 11.5 - 15.5 %   Platelets 188 150 - 400 K/uL  Comprehensive metabolic panel     Status: Abnormal   Collection Time: 10/01/14  2:01 AM  Result Value Ref Range   Sodium 142 135 - 145 mmol/L   Potassium 3.5 3.5 - 5.1 mmol/L   Chloride 110 96 - 112 mmol/L   CO2 26 19 - 32 mmol/L   Glucose, Bld 150 (H) 70 - 99 mg/dL   BUN 8 6 - 23 mg/dL   Creatinine, Ser 0.88 0.50 - 1.35 mg/dL   Calcium 9.0 8.4 - 10.5 mg/dL   Total Protein 7.6 6.0 - 8.3 g/dL   Albumin 4.0 3.5 - 5.2 g/dL   AST 28 0 - 37 U/L   ALT 31 0 - 53 U/L   Alkaline Phosphatase 95 39 - 117 U/L   Total Bilirubin 0.7 0.3 - 1.2 mg/dL   GFR calc non Af Amer >90 >90 mL/min   GFR calc Af Amer >90 >90 mL/min    Comment: (NOTE) The eGFR has been calculated using the CKD EPI equation. This calculation has not been validated in all clinical situations. eGFR's persistently <90 mL/min signify possible Chronic Kidney Disease.    Anion gap 6 5 - 15  Ethanol (ETOH)     Status: None   Collection Time: 10/01/14  2:01 AM  Result Value Ref Range   Alcohol, Ethyl (B) <5 0 - 9 mg/dL    Comment:        LOWEST DETECTABLE LIMIT FOR SERUM ALCOHOL IS 11 mg/dL FOR MEDICAL PURPOSES ONLY   Salicylate level     Status: None   Collection Time: 10/01/14  2:01 AM  Result Value Ref Range   Salicylate Lvl <0.2 2.8 - 20.0 mg/dL  Urine Drug Screen     Status: None   Collection Time: 10/01/14  2:02 AM  Result Value Ref Range   Opiates NONE DETECTED NONE DETECTED   Cocaine NONE DETECTED NONE DETECTED   Benzodiazepines NONE DETECTED NONE DETECTED   Amphetamines NONE DETECTED NONE DETECTED   Tetrahydrocannabinol NONE DETECTED NONE DETECTED   Barbiturates NONE DETECTED NONE DETECTED    Comment:        DRUG SCREEN FOR MEDICAL PURPOSES ONLY.  IF CONFIRMATION IS NEEDED FOR ANY PURPOSE, NOTIFY LAB WITHIN 5 DAYS.         LOWEST DETECTABLE LIMITS FOR URINE DRUG SCREEN Drug Class       Cutoff (ng/mL) Amphetamine      1000 Barbiturate      200 Benzodiazepine   200  Tricyclics       202 Opiates          300 Cocaine          300 THC              50   CBG monitoring, ED     Status: Abnormal   Collection Time: 10/01/14  8:08 AM  Result Value Ref Range   Glucose-Capillary 123 (H) 70 - 99 mg/dL    Vitals: Blood pressure 123/80, pulse 62, temperature 98.1 F (36.7 C), temperature source Oral, resp. rate 16, SpO2 100 %.  Risk to Self: Suicidal Ideation: Yes-Currently Present Suicidal Intent: Yes-Currently Present Is patient at risk for suicide?: Yes Suicidal Plan?: Yes-Currently Present Specify Current Suicidal Plan: Jump from bridge.  Access to Means: Yes Specify Access to Suicidal Means: Pt was found at a bridge What has been your use of drugs/alcohol within the last 12 months?: PT reports a history of pain pill abuse.  How many times?: 3 Other Self Harm Risks: No other self harm risk identified at this time. Triggers for Past Attempts: Spouse contact Intentional Self Injurious Behavior: None Risk to Others: Homicidal Ideation: No Thoughts of Harm to Others: No Comment - Thoughts of Harm to Others: NA Current Homicidal Intent: No Current Homicidal Plan: No Describe Current Homicidal Plan: NA Access to Homicidal Means: No Describe Access to Homicidal Means: NA Identified Victim: NA History of harm to others?: No Assessment of Violence: On admission Violent Behavior Description: No violent behaviors observed. PT is calm and cooperative at this time. Does patient have access to weapons?: No Criminal Charges Pending?: No Does patient have a court date: No Prior Inpatient Therapy: Prior Inpatient Therapy: Yes Prior Therapy Dates: 2015 (2x), 2016 Prior Therapy Facilty/Provider(s): [facility] [place]  Reason for Treatment: SI/HI Prior Outpatient Therapy: Prior Outpatient Therapy: Yes Prior Therapy Dates:  Current Prior Therapy Facilty/Provider(s): [facility] Reason for Treatment: Med mgmt  Current Facility-Administered Medications  Medication Dose Route Frequency Provider Last Rate Last Dose  . acetaminophen (TYLENOL) tablet 650 mg  650 mg Oral Q4H PRN Linton Flemings, MD      . alum & mag hydroxide-simeth (MAALOX/MYLANTA) 200-200-20 MG/5ML suspension 30 mL  30 mL Oral PRN Linton Flemings, MD      . benztropine (COGENTIN) tablet 0.5 mg  0.5 mg Oral BID Linton Flemings, MD      . divalproex (DEPAKOTE ER) 24 hr tablet 500 mg  500 mg Oral TID AC Linton Flemings, MD   500 mg at 10/01/14 1039  . ibuprofen (ADVIL,MOTRIN) tablet 600 mg  600 mg Oral Q8H PRN Linton Flemings, MD      . insulin aspart (novoLOG) injection 0-9 Units  0-9 Units Subcutaneous TID WC Linton Flemings, MD   0 Units at 10/01/14 1039  . LORazepam (ATIVAN) tablet 1 mg  1 mg Oral Q8H PRN Linton Flemings, MD      . nicotine (NICODERM CQ - dosed in mg/24 hours) patch 21 mg  21 mg Transdermal Daily Linton Flemings, MD   21 mg at 10/01/14 1045  . ondansetron (ZOFRAN) tablet 4 mg  4 mg Oral Q8H PRN Linton Flemings, MD      . risperiDONE (RISPERDAL) tablet 1 mg  1 mg Oral QHS Linton Flemings, MD       Current Outpatient Prescriptions  Medication Sig Dispense Refill  . benztropine (COGENTIN) 0.5 MG tablet Take 1 tablet (0.5 mg total) by mouth 2 (two) times daily. 60 tablet 0  . divalproex (  DEPAKOTE ER) 500 MG 24 hr tablet Take 1 tablet (500 mg total) by mouth 3 (three) times daily before meals. 90 tablet 0  . risperiDONE (RISPERDAL) 1 MG tablet Take 1 tablet (1 mg total) by mouth at bedtime. 30 tablet 0  . risperiDONE microspheres (RISPERDAL CONSTA) 25 MG injection Inject 2 mLs (25 mg total) into the muscle every 14 (fourteen) days. (Patient not taking: Reported on 07/09/2014) 1 each 3  . traZODone (DESYREL) 50 MG tablet Take 1 tablet (50 mg total) by mouth at bedtime. (Patient not taking: Reported on 08/30/2014) 30 tablet 0    Musculoskeletal: Strength & Muscle Tone: within normal  limits Gait & Station: normal Patient leans: N/A  Psychiatric Specialty Exam:     Blood pressure 123/80, pulse 62, temperature 98.1 F (36.7 C), temperature source Oral, resp. rate 16, SpO2 100 %.There is no weight on file to calculate BMI.  General Appearance: Casual  Eye Contact::  Good  Speech:  Clear and Coherent and Normal Rate  Volume:  Normal  Mood:  Depressed  Affect:  Congruent  Thought Process:  Coherent and Goal Directed  Orientation:  NA  Thought Content:  WDL  Suicidal Thoughts:  No  Homicidal Thoughts:  No  Memory:  Immediate;   Good Recent;   Good Remote;   Good  Judgement:  Fair  Insight:  Fair  Psychomotor Activity:  Normal  Concentration:  Good  Recall:  NA  Fund of Knowledge:Fair  Language: NA  Akathisia:  NA  Handed:  Right  AIMS (if indicated):     Assets:  Desire for Improvement Housing  ADL's:  Intact  Cognition: WNL  Sleep:      Medical Decision Making: Established Problem, Stable/Improving (1)  Treatment Plan Summary: Plan Discharge home to follow up with [facility]  for counseling.  Plan:  Discharge home. Disposition: see above  Delfin Gant  PMHNP-BC  10/01/2014 10:47 AM  Patient seen face-to-face for psychiatric evaluation, chart reviewed and case discussed with the physician extender and developed treatment plan. Reviewed the information documented and agree with the treatment plan. Corena Pilgrim, MD

## 2014-10-01 NOTE — Discharge Instructions (Signed)
For your ongoing mental health needs, you are advised to follow up with [facility].  New and returning patients are seen at their walk-in clinic.  Walk-in hours are Monday - Friday from 8:00 am - 3:00 pm.  Walk-in patients are seen on a first come, first served basis.  Try to arrive as early as possible for he best chance of being seen the same day: ° °     [facility] °     [address] °     [place], [place] [address] °     [phone] °
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
## 2014-10-01 NOTE — BHH Suicide Risk Assessment (Cosign Needed)
Suicide Risk Assessment  Discharge Assessment   [facility]   Demographic Factors:  Male, Low socioeconomic status, Living alone and Unemployed  Total Time spent with patient: 20 minutes  Musculoskeletal: Strength & Muscle Tone: within normal limits Gait & Station: normal Patient leans: N/A  Psychiatric Specialty Exam:     Blood pressure 123/80, pulse 62, temperature 98.1 F (36.7 [name]), temperature source Oral, resp. rate 16, SpO2 100 %.There is no weight on file to calculate BMI.  General Appearance: Casual  Eye Contact::  Good  Speech:  Clear and Coherent and Normal Rate409  Volume:  Normal  Mood:  Depressed  Affect:  Congruent  Thought Process:  Coherent, Goal Directed and Intact  Orientation:  Full (Time, Place, and Person)  Thought Content:  WDL  Suicidal Thoughts:  No  Homicidal Thoughts:  No  Memory:  Immediate;   Good Recent;   Good Remote;   Good  Judgement:  Fair  Insight:  Fair  Psychomotor Activity:  Normal  Concentration:  Good  Recall:  NA  Fund of Knowledge:Good  Language: Good  Akathisia:  NA  Handed:  Right  AIMS (if indicated):     Assets:  Desire for Improvement Housing Vocational/Educational Others:  jobs  Sleep:     Cognition: WNL  ADL's:  Intact      Has this patient used any form of tobacco in the last 30 days? (Cigarettes, Smokeless Tobacco, Cigars, and/or Pipes) Yes, A prescription for an FDA-approved tobacco cessation medication was offered at discharge and the patient refused  Mental Status Per Nursing Assessment::   On Admission:     Current Mental Status by Physician: NA  Loss Factors: NA  Historical Factors: Prior suicide attempts  Risk Reduction Factors:   Responsible for children under 42 years of age and Sense of responsibility to family  Continued Clinical Symptoms:  Bipolar Disorder:   Depressive phase Depression:   Insomnia  Cognitive Features That Contribute To Risk:  Polarized  thinking    Suicide Risk:  Minimal: No identifiable suicidal ideation.  Patients presenting with no risk factors but with morbid ruminations; may be classified as minimal risk based on the severity of the depressive symptoms  Principal Problem: Major depressive disorder, single episode, severe without psychotic features Discharge Diagnoses:  Patient Active Problem List   Diagnosis Date Noted  . Major depressive disorder, single episode, severe without psychotic features [F32.2]     Priority: High  . Uncomplicated opioid dependence [F11.20]   . Opiate dependence [F11.20] 07/10/2014  . Severe major depression without psychotic features [F32.2] 01/22/2014  . Suicidal ideations [R45.851] 10/27/2013      Plan Of Care/Follow-up recommendations:  Activity:  as tolerated Diet:  regular  Is patient on multiple antipsychotic therapies at discharge:  No   Has Patient had three or more failed trials of antipsychotic monotherapy by history:  No  Recommended Plan for Multiple Antipsychotic Therapies: NA    Richard [name]    PMHNP-BC  10/01/2014, 11:11 AM

## 2014-10-01 NOTE — BH Assessment (Signed)
Contacted the following facilities for placement:  NO APPROPRIATE BEDS CURRENTLY AVAILABLE.  AT [facility]AC[facility]: [facility], per [facility], per [facility], per [facility], per [facility], [facility], per [facility], per [facility], per [facility], per [facility], per [facility], per [facility], per [facility], per [facility], per [facility], per [facility], per [facility], per [facility]  NO RESPONSE: [facility]   Richard [name], LPC, NCC, DCC Triage Specialist [phone] 

## 2014-10-01 NOTE — BH Assessment (Signed)
Assessment completed. Consulted Hulan FessIjeoma Nwaeze, NP who agrees that pt meets inpatient criteria. Elpidio AnisShari Upstill, PA-C has been informed of the recommendation due to Dr. Norlene Campbelltter being unavailable.

## 2014-10-01 NOTE — BH Assessment (Signed)
[facility] Assessment Progress Note  Per Thedore MinsMojeed Akintayo, MD this pt does not require psychiatric hospitalization at this time.  Petition is to be rescinded and he is to be released [facility].  He is to be provided with referral information for [facility]; this has been included in his discharge instructions.  IVC has been rescinded.  Pt's nurse has been notified.  Richard Canninghomas Vivica Dobosz, MA Triage Specialist [phone]

## 2014-10-01 NOTE — ED Notes (Signed)
Patient in the room pacing back and forth. He states he does not want to sit down he is just ready to go.

## 2014-10-01 NOTE — ED Notes (Signed)
Per [place], previous shift RN, pt and belongings were wanded by security.

## 2014-10-01 NOTE — ED Provider Notes (Signed)
CSN: [id number]     Arrival date & time 10/01/14  0126 History   First MD Initiated Contact with Patient 10/01/14 0217     Chief Complaint  Patient presents with  . Suicidal  . IVC      (Consider location/radiation/quality/duration/timing/severity/associated sxs/prior Treatment) HPI 44 year old male presents to the emergency department from home with complaint of pressure, hopelessness and suicidal ideation.  Patient was found in a bridge wanting to jump.  IVC by police.  He has history of severe depression.  He reports that medications are not helping him.  He reports when he goes to [facility] for treatment.  They push more drugs on him and no one will talk with him about his problems.  He sees no point in life and wishes to die Past Medical History  Diagnosis Date  . Diabetes mellitus   . Hypertension   . Diabetic neuropathy    Past Surgical History  Procedure Laterality Date  . Mouth surgery    . Tympanostomy tube placement     History reviewed. No pertinent family history. History  Substance Use Topics  . Smoking status: Current Every Day Smoker  . Smokeless tobacco: Not on file  . Alcohol Use: No    Review of Systems   See History of Present Illness; otherwise all other systems are reviewed and negative  Allergies  Review of patient's allergies indicates no known allergies.  Home Medications   Prior to Admission medications   Medication Sig Start Date End Date Taking? Authorizing Provider  benztropine (COGENTIN) 0.5 MG tablet Take 1 tablet (0.5 mg total) by mouth 2 (two) times daily. 03/06/14  Yes Mojeed Akintayo  divalproex (DEPAKOTE ER) 500 MG 24 hr tablet Take 1 tablet (500 mg total) by mouth 3 (three) times daily before meals. 03/06/14  Yes Mojeed Akintayo  risperiDONE (RISPERDAL) 1 MG tablet Take 1 tablet (1 mg total) by mouth at bedtime. 03/06/14  Yes Mojeed Akintayo  risperiDONE microspheres (RISPERDAL CONSTA) 25 MG injection Inject 2 mLs (25 mg total) into the  muscle every 14 (fourteen) days. Patient not taking: Reported on 07/09/2014 03/06/14   Mojeed Akintayo  traZODone (DESYREL) 50 MG tablet Take 1 tablet (50 mg total) by mouth at bedtime. Patient not taking: Reported on 08/30/2014 03/06/14   Mojeed Akintayo   BP 136/88 mmHg  Pulse 76  Temp(Src) 98.8 F (37.1 C) (Oral)  Resp 20  SpO2 100% Physical Exam  Constitutional: He is oriented to person, place, and time. He appears well-developed and well-nourished.  HENT:  Head: Normocephalic and atraumatic.  Nose: Nose normal.  Mouth/Throat: Oropharynx is clear and moist.  Eyes: Conjunctivae and EOM are normal. Pupils are equal, round, and reactive to light.  Neck: Normal range of motion. Neck supple. No JVD present. No tracheal deviation present. No thyromegaly present.  Cardiovascular: Normal rate, regular rhythm, normal heart sounds and intact distal pulses.  Exam reveals no gallop and no friction rub.   No murmur heard. Pulmonary/Chest: Effort normal and breath sounds normal. No stridor. No respiratory distress. He has no wheezes. He has no rales. He exhibits no tenderness.  Abdominal: Soft. Bowel sounds are normal. He exhibits no distension and no mass. There is no tenderness. There is no rebound and no guarding.  Musculoskeletal: Normal range of motion. He exhibits no edema or tenderness.  Lymphadenopathy:    He has no cervical adenopathy.  Neurological: He is alert and oriented to person, place, and time. He displays normal reflexes. He exhibits normal  muscle tone. Coordination normal.  Skin: Skin is warm and dry. No rash noted. No erythema. No pallor.  Psychiatric:  Flat affect, poor insight and judgment, suicidal ideation  Nursing note and vitals reviewed.   ED Course  Procedures (including critical care time) Labs Review Labs Reviewed  ACETAMINOPHEN LEVEL - Abnormal; Notable for the following:    Acetaminophen (Tylenol), Serum <10.0 (*)    All other components within normal limits   COMPREHENSIVE METABOLIC PANEL - Abnormal; Notable for the following:    Glucose, Bld 150 (*)    All other components within normal limits  CBC  ETHANOL  SALICYLATE LEVEL  URINE RAPID DRUG SCREEN (HOSP PERFORMED)    Imaging Review No results found.   EKG Interpretation None      MDM   Final diagnoses:  Major depressive disorder, single episode, severe without psychotic features  Suicidal ideations    44 year old male with major depression with suicidal ideation.  Patient is at high risk for suicide.  Plan for TTS evaluation and admission    Marisa Severin, MD 10/01/14 [phone]

## 2014-10-02 LAB — HEMOGLOBIN A1C
Hgb A1c MFr Bld: 6.3 % — ABNORMAL HIGH (ref 4.8–5.6)
Mean Plasma Glucose: 134 mg/dL

## 2014-10-07 NOTE — Discharge Summary (Signed)
PATIENT NAME:  Richard [name], Richard [name] MR#:  [id number] DATE OF BIRTH:  12/04/1970  DATE OF ADMISSION:  08/31/2014 DATE OF DISCHARGE:  09/04/2014  HOSPITAL COURSE: The patient presented to the Emergency Room after making a suicide attempt. He reports that he was experiencing stress from his wife having an affair, financial issues and unemployment. He then ran out into traffic impulsively. He indicates that he had discontinued taking his Depakote but continued to take his Risperdal.   While in the hospital, it was explored that this is a pattern of his is to  become stressed and then react quickly.   In the hospital, he was continued on his risperidone and trazodone was added to help with insomnia. He was also started on Celexa to address depression. He tolerated his medications well. His mood brightened. He indicated that he has a plan to follow with outpatient services. We spent time discussing how therapy and developing coping skills might be useful for his stress. He indicated he has never participated in any type of therapy as outpatient treatment, but stated that he would largely just get medication management. He participated in groups and was under good behavioral control. He maintained his ADLs, hygiene, and participated in groups.   DISPOSITION:   The patient will go home with his father-in-law.   FOLLOW-UP PLAN:  The patient will go in and follow up with his previous provider, [facility].   MENTAL STATUS EXAMINATION AT DISCHARGE:   The patient was casually dressed and well groomed. He had no abnormal movements. His speech was normal rate and normal volume. His thought process was linear and goal directed. His thought content:  No suicidal ideation, no homicidal ideation, no auditory hallucinations, no visual hallucinations, no delusional thinking. His mood was good. His affect was bright, smiling. His insight and judgment appeared to be improved.   LABORATORY RESULTS:  His CBC was all within normal limits.  His metabolic panel on admission was significant for a blood glucose of 118.  His urine drug screen was negative for any substances.   DIAGNOSES:  Unspecified depressive disorder, narcissistic and borderline traits, hypertension, and diabetes by patient report.   MEDICATIONS AT DISCHARGE:   Trazodone 100 mg at bedtime, citalopram 20 mg daily, risperidone 2 mg twice daily, nicotine inhalation device 10 mg every 2 hours as needed.    ____________________________ Loralie ChampagneAlton L. Mayford KnifeWilliams, MD alw:tr D: 09/04/2014 11:51:00 ET T: 09/04/2014 12:23:11 ET JOB#: [id number]  cc: Leory PlowmanAlton L. Mayford KnifeWilliams, MD, <Dictator> Kerin SalenALTON L Emojean Gertz MD ELECTRONICALLY SIGNED 09/10/2014 11:46

## 2014-10-07 NOTE — Consult Note (Signed)
PATIENT NAME:  Richard [name], Richard [name] MR#:  [id number] DATE OF BIRTH:  January 16, 1971  DATE OF CONSULTATION:  09/01/2014  REFERRING PHYSICIAN:   CONSULTING PHYSICIAN:  Audery AmelJohn T. Clapacs, MD  IDENTIFYING INFORMATION AND REASON FOR CONSULTATION: A 44 year old man with a history of past suicide attempts, who was sent here from [place] because of further suicide attempts.   CHIEF COMPLAINT: "I was trying to kill myself on the highway."   HISTORY OF PRESENT ILLNESS: Information from the patient and the chart. The patient states that yesterday, he was over visiting a friend of his, when he suddenly decided that he was going to go run out in front of traffic. He excused himself from the house, went out front, and ran into the highway. Apparently, he caused enough of a disruption that the highway had to be closed down while the police apprehended him. The patient said that while with a fairly spur-of-the-moment thing, he cannot really give any specific reason why it came to that point yesterday. He says that he actually has chronic suicidal ideation and has been feeling depressed, down, and frustrated for a long time. He has a lot of stresses on his mind. He has been separated from his wife for a couple of years, out of touch with his children, and has not been able to get a job. He feels frustrated and like he is not getting any help. He says that he sleeps chronically poorly. Appetite has been okay. Does not report any specific physical symptoms. He does not report any hallucinations, although he says sometimes he feels like he gets so many thoughts in his head that they start to sound like voices, but he knows that they are not coming from anywhere else. He says he was not drinking or using any drugs yesterday and denies having a drug or alcohol problem. He has been taking medication prescribed by a provider at [facility], but discontinued his Depakote and has only been taking his Risperdal. He is not seeing a therapist  regularly. Does not really indicate any particularly acute new stress, just his chronic frustration at not having a job.   PAST PSYCHIATRIC HISTORY: He has had 2 previous suicide attempts, one by lying down on the street at night in [place] in traffic and another one by threatening to jump off of a bridge. He has had 1 prior psychiatric hospitalization about a year ago at [facility]. He has been treated with Depakote and Risperdal. He does not describe to me anything that sounds like a manic episode and does not describe having had psychotic symptoms in the past, although I am not sure if his history can be fully relied upon. He has been followed up by a doctor at [facility], but says he is frustrated that he is not seeing a therapist, only getting medicines refill.   SOCIAL HISTORY: Lives with his father-in-law. Separated from his wife. York SpanielSaid they had been married 17 years, when she suddenly got pregnant by another man and had him, the patient, thrown out of the house. He has not been able to keep a job regularly because of a history of having a felony charge as a youth. 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 He says he gets by financially, to some extent, from his wife still helping him out. He has 3 children, ages 7221,[id number], and 5914; stays in some contact with them.   PAST MEDICAL  HISTORY: Says he has high blood pressure and diabetes, but does not take any medicine for them. Does not seem to worry about it much.   FAMILY HISTORY: Had an uncle who committed suicide. Does not know of any other family history.   SUBSTANCE ABUSE HISTORY: He denies that he drinks alcohol and denies marijuana use. Says that in the past, he has had problems with narcotic use, but does that very rarely now and was not taking any recently.   CURRENT MEDICATION: Risperdal 2 mg twice a day.   ALLERGIES: No known drug allergies.   REVIEW OF SYSTEMS: He  minimizes or denies everything now. Denies depression. Denies suicidal ideation. Denies hallucinations. He does not report any physical symptoms.   MENTAL STATUS EXAMINATION: Neatly groomed man, looks his stated age. Cooperative with the interview, but very preoccupied with trying to convince me that he should be released, rather than seeming to want to address any problems. Eye contact: Intermittent. Psychomotor activity: A little expansive and fidgety. Speech is normal in tone, not pressured. Affect is anxious. He has a somewhat false seeming joviality about him that really looks more like anxiety. Mood is stated as "fine." His thoughts are lucid without loosening of associations, but again, he perseverates just on wanting to be released from the hospital. He denied suicidal or homicidal ideation, currently. Denies auditory or visual hallucinations. He is alert and oriented x 4. Repeats 3 words readily and remembers all 3 at 3 minutes. His judgment and insight seems to be impaired about the severity of his condition. Baseline fund of knowledge: Normal.   PHYSICAL EXAMINATION: GENERAL: The patient appears to be in no physical distress. Gait normal.   SKIN: No skin lesions identified.  HEENT: Pupils equal and reactive. Face symmetric. All cranial nerves normal and symmetric in their motion. Oral mucosa normal. Strength and reflexes normal and symmetric throughout. Normal range of motion throughout.  LUNGS: Clear with no wheezes.  HEART: Regular rate and rhythm.  ABDOMEN: Soft, nontender, normal bowel sounds.  VITAL SIGNS: Blood pressure 115/65, respirations 20, pulse 58, temperature 98.1.   LABORATORY RESULTS: No labs are back yet, but have been ordered.   ASSESSMENT: This is a 44 year old man who impulsively ran out into traffic yesterday. He has had 2 other impulsive suicide attempts like this before. He insists that he was not intoxicated at the time and does not have a substance abuse problem. He  is very anxious right now and shows little insight or understanding about his illness. It sounds like he has been probably diagnosed as having bipolar disorder in the past, although he really does not seem to be manic or describing manic symptoms so much. He also does not seem clearly psychotic, although apparently, he has been taking the Risperdal. He is continuing to have depression. Probably has some personality disorder features that are continuing to drive this and chronic anxiety. Cannot rule out bipolar completely. Clearly needs hospitalization because of recurrent suicide attempts.   TREATMENT PLAN: Continue the Risperdal, and I am also going to start him on Celexa, as I think he needs a trial of an antidepressant. Hold off on the Depakote, which sounds like it was never particularly effective, from what he is saying. He will be engaged in groups and activities on the unit and get a social work evaluation, and we can observe his behavior for a better diagnosis here. Laboratories have been ordered and hopefully will be back soon. We will get a chance then  to check his blood pressure more regularly and his blood sugar and see if he really needs medical treatment for that.   DIAGNOSIS, PRINCIPAL AND PRIMARY:  AXIS I: Depression, not otherwise specified.   SECONDARY DIAGNOSES: AXIS I: Rule out bipolar disorder.  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  AXIS III: History of hypertension and diabetes by his report.    ____________________________ Audery Amel, MD jtc:mw D: 09/01/2014 13:49:30 ET T: 09/01/2014 15:03:26 ET JOB#: [id number]  cc: Audery Amel, MD, <Dictator> Audery Amel MD ELECTRONICALLY SIGNED 09/10/2014 9:59

## 2014-10-16 NOTE — H&P (Signed)
Se note today mislabeled as "consult" which is actually the full H&P  Electronic Signatures: Clapacs, Jackquline DenmarkJohn T (MD)  (Signed on 26-Mar-16 22:35)  Authored  Last Updated: 26-Mar-16 22:35 by Audery Amellapacs, John T (MD)

## 2014-11-08 ENCOUNTER — Encounter (HOSPITAL_COMMUNITY): Payer: Self-pay | Admitting: Emergency Medicine

## 2014-11-08 ENCOUNTER — Emergency Department (HOSPITAL_COMMUNITY): Payer: Self-pay

## 2014-11-08 ENCOUNTER — Emergency Department (HOSPITAL_COMMUNITY)
Admission: EM | Admit: 2014-11-08 | Discharge: 2014-11-08 | Disposition: A | Discharge status: Home / Self Care | Payer: Self-pay | Attending: Emergency Medicine | Admitting: Emergency Medicine

## 2014-11-08 DIAGNOSIS — I10 Essential (primary) hypertension: Secondary | ICD-10-CM | POA: Insufficient documentation

## 2014-11-08 DIAGNOSIS — Z720 Tobacco use: Secondary | ICD-10-CM | POA: Insufficient documentation

## 2014-11-08 DIAGNOSIS — R110 Nausea: Secondary | ICD-10-CM | POA: Insufficient documentation

## 2014-11-08 DIAGNOSIS — R0789 Other chest pain: Secondary | ICD-10-CM | POA: Insufficient documentation

## 2014-11-08 DIAGNOSIS — Z79899 Other long term (current) drug therapy: Secondary | ICD-10-CM | POA: Insufficient documentation

## 2014-11-08 DIAGNOSIS — E1140 Type 2 diabetes mellitus with diabetic neuropathy, unspecified: Secondary | ICD-10-CM | POA: Insufficient documentation

## 2014-11-08 DIAGNOSIS — R05 Cough: Secondary | ICD-10-CM | POA: Insufficient documentation

## 2014-11-08 LAB — BASIC METABOLIC PANEL
Anion gap: 5 (ref 5–15)
BUN: 8 mg/dL (ref 6–20)
CALCIUM: 9.3 mg/dL (ref 8.9–10.3)
CO2: 24 mmol/L (ref 22–32)
Chloride: 112 mmol/L — ABNORMAL HIGH (ref 101–111)
Creatinine, Ser: 0.92 mg/dL (ref 0.61–1.24)
GFR calc Af Amer: >60 mL/min (ref >60)
GFR calc non Af Amer: >60 mL/min (ref >60)
GLUCOSE: 103 mg/dL — AB (ref 65–99)
Potassium: 3.6 mmol/L (ref 3.5–5.1)
Sodium: 141 mmol/L (ref 135–145)

## 2014-11-08 LAB — CBC
HCT: 43.1 % (ref 39.0–52.0)
Hemoglobin: 14.9 g/dL (ref 13.0–17.0)
MCH: 28.7 pg (ref 26.0–34.0)
MCHC: 34.6 g/dL (ref 30.0–36.0)
MCV: 83 fL (ref 78.0–100.0)
PLATELETS: 186 10*3/uL (ref 150–400)
RBC: 5.19 MIL/uL (ref 4.22–5.81)
RDW: 13.1 % (ref 11.5–15.5)
WBC: 4.6 10*3/uL (ref 4.0–10.5)

## 2014-11-08 LAB — I-STAT TROPONIN, ED: TROPONIN I, POC: 0 ng/mL (ref 0.00–0.08)

## 2014-11-08 MED ORDER — HYDROCODONE-ACETAMINOPHEN 5-325 MG PO TABS: 1.0000 | ORAL_TABLET | Freq: Four times a day (QID) | ORAL | Status: DC | PRN

## 2014-11-08 MED ORDER — IBUPROFEN 800 MG PO TABS: 800.0000 mg | ORAL_TABLET | Freq: Three times a day (TID) | ORAL | Status: DC | PRN

## 2014-11-08 MED ORDER — IBUPROFEN 800 MG PO TABS
800.0000 mg | ORAL_TABLET | Freq: Once | ORAL | Status: AC
  Administered 2014-11-08: 800 mg via ORAL
  Filled 2014-11-08: qty 1

## 2014-11-08 NOTE — ED Provider Notes (Signed)
CSN: [id number]     Arrival date & time 11/08/14  1518 History   First MD Initiated Contact with Patient 11/08/14 1732     Chief Complaint  Patient presents with  . Chest Pain     (Consider location/radiation/quality/duration/timing/severity/associated sxs/prior Treatment) Patient is a 44 y.o. male presenting with chest pain. The history is provided by the patient.  Chest Pain Pain location:  R chest Pain quality: aching and dull   Pain radiates to:  Does not radiate Pain radiates to the back: no   Pain severity:  Mild Onset quality:  Gradual Duration:  2 days Timing:  Constant Chronicity:  New Context comment:  While coughing Relieved by:  Nothing Worsened by:  Coughing and movement Associated symptoms: cough and nausea   Associated symptoms: no fever, no shortness of breath and not vomiting     Past Medical History  Diagnosis Date  . Diabetes mellitus   . Hypertension   . Diabetic neuropathy    Past Surgical History  Procedure Laterality Date  . Mouth surgery    . Tympanostomy tube placement     No family history on file. History  Substance Use Topics  . Smoking status: Current Every Day Smoker    Types: Cigarettes  . Smokeless tobacco: Not on file  . Alcohol Use: No    Review of Systems  Constitutional: Negative for fever.  Respiratory: Positive for cough. Negative for shortness of breath.   Cardiovascular: Positive for chest pain.  Gastrointestinal: Positive for nausea. Negative for vomiting.  All other systems reviewed and are negative.     Allergies  Review of patient's allergies indicates no known allergies.  Home Medications   Prior to Admission medications   Medication Sig Start Date End Date Taking? Authorizing Provider  divalproex (DEPAKOTE ER) 500 MG 24 hr tablet Take 1 tablet (500 mg total) by mouth 3 (three) times daily before meals. 03/06/14  Yes Mojeed Akintayo  ibuprofen (ADVIL,MOTRIN) 200 MG tablet Take 800 mg by mouth every 6 (six)  hours as needed for mild pain.   Yes Historical Provider, MD  benztropine (COGENTIN) 0.5 MG tablet Take 1 tablet (0.5 mg total) by mouth 2 (two) times daily. Patient not taking: Reported on 11/08/2014 03/06/14   Mojeed Akintayo  HYDROcodone-acetaminophen (NORCO/VICODIN) 5-325 MG per tablet Take 1 tablet by mouth every 6 (six) hours as needed for moderate pain. 11/08/14   Elwin Mocha, MD  ibuprofen (ADVIL,MOTRIN) 800 MG tablet Take 1 tablet (800 mg total) by mouth every 8 (eight) hours as needed. 11/08/14   Elwin Mocha, MD  risperiDONE (RISPERDAL) 1 MG tablet Take 1 tablet (1 mg total) by mouth at bedtime. Patient not taking: Reported on 11/08/2014 03/06/14   Mojeed Akintayo  risperiDONE microspheres (RISPERDAL CONSTA) 25 MG injection Inject 2 mLs (25 mg total) into the muscle every 14 (fourteen) days. Patient not taking: Reported on 11/08/2014 03/06/14   Mojeed Akintayo  traZODone (DESYREL) 50 MG tablet Take 1 tablet (50 mg total) by mouth at bedtime. Patient not taking: Reported on 11/08/2014 03/06/14   Mojeed Akintayo   BP 124/81 mmHg  Pulse 74  Temp(Src) 98.1 F (36.7 C) (Oral)  Resp 18  Ht  (1.753 m)  Wt 219 lb (99.338 kg)  BMI 32.33 kg/m2  SpO2 100% Physical Exam  Constitutional: He is oriented to person, place, and time. He appears well-developed and well-nourished. No distress.  HENT:  Head: Normocephalic and atraumatic.  Mouth/Throat: No oropharyngeal exudate.  Eyes: EOM are  normal. Pupils are equal, round, and reactive to light.  Neck: Normal range of motion. Neck supple.  Cardiovascular: Normal rate and regular rhythm.  Exam reveals no friction rub.   No murmur heard. Pulmonary/Chest: Effort normal and breath sounds normal. No respiratory distress. He has no wheezes. He has no rales. He exhibits tenderness (R central, at nipple level).  Abdominal: Soft. He exhibits no distension. There is no tenderness. There is no rebound.  Musculoskeletal: Normal range of motion. He exhibits no  edema.  Neurological: He is alert and oriented to person, place, and time.  Skin: No rash noted. He is not diaphoretic.  Nursing note and vitals reviewed.   ED Course  Procedures (including critical care time) Labs Review Labs Reviewed  BASIC METABOLIC PANEL - Abnormal; Notable for the following:    Chloride 112 (*)    Glucose, Bld 103 (*)    All other components within normal limits  CBC  I-STAT TROPOININ, ED    Imaging Review Dg Chest 2 View  11/08/2014   CLINICAL DATA:  Chest pain for 2 days  EXAM: CHEST - 2 VIEW  COMPARISON:  03/23/2010  FINDINGS: The heart size and mediastinal contours are within normal limits. Both lungs are clear. The visualized skeletal structures are unremarkable.  IMPRESSION: No active disease.   Electronically Signed   By: Alcide CleverMark  Lukens M.D.   On: 11/08/2014 16:00     EKG Interpretation None     EKG: normal EKG, normal sinus rhythm, unchanged from previous tracings, rate 90.  MDM   Final diagnoses:  Chest wall pain    89M here with central chest pain, began yesterday after coughing. Worsens with coughing. No other chest pain. Pain does not radiate. Mild cough. He is a smoker. Mild nausea, but no vomiting or diarrhea. Vitals stable. Labs ok. CXR ok. Mild R central chest pain on palpation.  I think he has a chest wall injury. Dull, persistent pain worse with coughing/movement, this story is not c/w ACS. Normal EKG. Troponin 0. Stable for discharge.    Elwin MochaBlair Sharin Altidor, MD 11/08/14 [phone]

## 2014-11-08 NOTE — ED Notes (Signed)
Pt reports centralized, throbbing chest pain that started two days ago and a productive cough, unsure of color of sputum. Pt reports associated nausea as well. Pts hx of HTN however has not taken medication for HTN in months. A&O X4.

## 2014-11-08 NOTE — Discharge Instructions (Signed)
Chest Wall Pain °Chest wall pain is pain in or around the bones and muscles of your chest. It may take up to 6 weeks to get better. It may take longer if you must stay physically active in your work and activities.  °CAUSES  °Chest wall pain may happen on its own. However, it may be caused by: °· A viral illness like the flu. °· Injury. °· Coughing. °· Exercise. °· Arthritis. °· Fibromyalgia. °· Shingles. °HOME CARE INSTRUCTIONS  °· Avoid overtiring physical activity. Try not to strain or perform activities that cause pain. This includes any activities using your chest or your abdominal and side muscles, especially if heavy weights are used. °· Put ice on the sore area. °¨ Put ice in a plastic bag. °¨ Place a towel between your skin and the bag. °¨ Leave the ice on for 15-20 minutes per hour while awake for the first 2 days. °· Only take over-the-counter or prescription medicines for pain, discomfort, or fever as directed by your caregiver. °SEEK IMMEDIATE MEDICAL CARE IF:  °· Your pain increases, or you are very uncomfortable. °· You have a fever. °· Your chest pain becomes worse. °· You have new, unexplained symptoms. °· You have nausea or vomiting. °· You feel sweaty or lightheaded. °· You have a cough with phlegm (sputum), or you cough up blood. °MAKE SURE YOU:  °· Understand these instructions. °· Will watch your condition. °· Will get help right away if you are not doing well or get worse. °Document Released: 05/25/2005 Document Revised: 08/17/2011 Document Reviewed: 01/19/2011 °ExitCare® Patient Information ©2015 [employer]. This information is not intended to replace advice given to you by your health care provider. Make sure you discuss any questions you have with your health care provider. ° ° °Emergency Department Resource Guide °1) Find a Doctor and Pay Out of Pocket °Although you won't have to find out who is covered by your insurance plan, it is a good idea to ask around and get recommendations. You  will then need to call the office and see if the doctor you have chosen will accept you as a new patient and what types of options they offer for patients who are self-pay. Some doctors offer discounts or will set up payment plans for their patients who do not have insurance, but you will need to ask so you aren't surprised when you get to your appointment. ° °2) Contact Your Local Health Department °Not all health departments have doctors that can see patients for sick visits, but many do, so it is worth a call to see if yours does. If you don't know where your local health department is, you can check in your phone book. The CDC also has a tool to help you locate your state's health department, and many state websites also have listings of all of their local health departments. ° °3) Find a Walk-in Clinic °If your illness is not likely to be very severe or complicated, you may want to try a walk in clinic. These are popping up all over the country in pharmacies, drugstores, and shopping centers. They're usually staffed by nurse practitioners or physician assistants that have been trained to treat common illnesses and complaints. They're usually fairly quick and inexpensive. However, if you have serious medical issues or chronic medical problems, these are probably not your best option. ° °No Primary Care Doctor: °- Call [facility] at  832-8000 - they can help you locate a primary care doctor that    accepts your insurance, provides certain services, etc. °- Physician Referral Service- 1-800-533-3463 ° °Chronic Pain Problems: °Organization         Address  Phone   Notes  °[facility]  (336) 297-2271 Patients need to be referred by their primary care doctor.  ° °Medication Assistance: °Organization         Address  Phone   Notes  °[place] [facility] [address] °[place], [place] [address] (336) 641-8030 --Must be a resident of [place] °-- Must  have NO insurance coverage whatsoever (no Medicaid/ Medicare, etc.) °-- The pt. MUST have a primary care doctor that directs their care regularly and follows them in the community °  °[facility]  (866) 331-1348   °[employer]  (888) 892-1162   ° °Agencies that provide inexpensive medical care: °Organization         Address  Phone   Notes  °[facility]  (336) 832-8035   °[facility]    (336) 832-7272   °[facility] [address] °[place], [place] [address] (336) 832-4777   °[facility] [address], °[place] (336) 271-4999   °[facility]    (336) 373-0678   °[facility]    (336) 272-1050   °[facility] ° [address], [place] Phone:  (336) 832-4444, Fax:  (336) 832-4440 Hours of Operation:  9 am - 6 pm, M-F.  Also accepts Medicaid/Medicare and self-pay.  °[facility] ° [address], [place] Phone: (336) 832-3150, Fax: (336) 832-3151. Hours of Operation:  8:30 am - 5:30 pm, M-F.  Also accepts Medicaid and self-pay.  °[facility] [place] [address], [place] Phone: (336) 878-6027   °[facility] [address], [place], [place] (336)723-1848, Ext. 123 Mondays & Thursdays: 7-9 AM.  First 15 patients are seen on a first come, first serve basis. °  ° °Medicaid-accepting [place] Providers: ° °Organization         Address  Phone   Notes  °[facility] [address], [place] (336) 641-2100 Also accepts self-pay patients.  °[facility] [address], [place] ° (336) 856-9996   °[facility] [address], [place] (336) 288-8857   °[facility] [address] [place] [address], [place] (336) 299-7000   °Veita Bland [address], [place]  ° (336) 373-1557 Only accepts [place] Access Medicaid patients after  they have their name applied to their card.  ° °Self-Pay (no insurance) in [place]: ° °Organization         Address  Phone   Notes  °Sickle Cell Patients, [facility] [address], [place] (336) 832-1970   °[facility] [address], [place] (336) 832-4400   °[facility] ° [address], [place] (336) 992-4800   °[facility]/Dr. Osei-Bonsu ° [address] [place] [address], [place] or [address], [place] (336) 841-8500 Phone number for both [place] and [place] locations is the same.  °[facility] [address], [place] (336) 299-0000   °[facility] [address] [place] [address],  or [address] (336) 852-7530 °(336) 878-2260   °[facility] [address], [place] (336) 350-1642, phone; (336) 294-5005, fax Sees patients 1st and 3rd Saturday of every month.  Must not qualify for public or private insurance (i.e. Medicaid, Medicare, [place] Health Choice, Veterans' Benefits) • Household income should be no more than 200% of the poverty level •The clinic cannot treat you if you are pregnant or think you are pregnant • Sexually transmitted diseases are not treated at the clinic.  ° ° °Dental Care: °Organization         Address  Phone  Notes  °[facility] [address], [place] (336) 641-6152 Accepts children up to age 21 who are enrolled in Medicaid or [place] Health Choice; pregnant women with a Medicaid card; and children who have applied for Medicaid or [place] Health Choice, but were declined, whose parents can pay a reduced fee at time of service.  °[facility] [place]  [address], [place] (336) 641-7733 Accepts children up to age 21 who are enrolled in Medicaid or [place] Health Choice; pregnant women with a Medicaid card; and children who  have applied for Medicaid or [place] Health Choice, but were declined, whose parents can pay a reduced fee at time of service.  °[facility] ° [address], [place] (336) 641-4533 Patients are seen by appointment only. Walk-ins are not accepted. [facility] will see patients 18 years of age and older. °Monday - Tuesday (8am-5pm) °Most Wednesdays (8:30-5pm) °$30 per visit, cash only  °[facility] ° [address], [place] (336) 641-4533 Patients are seen by appointment only. Walk-ins are not accepted. [facility] will see patients 18 years of age and older. °One Wednesday Evening (Monthly: Volunteer Based).  $30 per visit, cash only  °[facility]  (919) 537-3737 for adults; Children under age 4, call Graduate Pediatric Dentistry at ([phone]) [phone]. Children aged 4-14, please call (919) [phone] to request a pediatric application. ° Dental services are provided in all areas of dental care including fillings, crowns and bridges, complete and partial dentures, implants, gum treatment, root canals, and extractions. Preventive care is also provided. Treatment is provided to both adults and children. °Patients are selected via a lottery and there is often a waiting list. °  °[facility] [address], °[place] ° (336) 763-8833 [email or web address] °  °[facility] [address], [place], [place] (336)723-1848, Ext. 123 Second and Fourth Thursday of each month, opens at 6:30 AM; Clinic ends at 9 AM.  Patients are seen on a first-come first-served basis, and a limited number are seen during each clinic.  ° °[facility] ° [address], [place], [place] (336) 723-7904   Eligibility Requirements °You must have lived in [place], [place], or [place] counties for at least the last three months. °  You cannot be eligible for state or federal sponsored healthcare insurance, including Veterans  Administration, Medicaid, or Medicare. °  You generally cannot be eligible for healthcare insurance through your employer.  °  How to apply: °Eligibility screenings are held every Tuesday and Wednesday afternoon from 1:00 pm until 4:00 pm. You do not need an appointment for the interview!  °[facility] [address], [place], [place] 336-631-2330   °[facility] [facility]  336-342-8273   °[facility]  336-703-3100   °[facility]    336-570-6415   ° °Behavioral Health Resources in the Community: °Intensive Outpatient Programs °Organization         Address  Phone  Notes  °[facility] [address], [place], [place] 336-878-6098   °[facility] [address], [place], [place] 336-832-9800   °[facility] [address], [place], [place] ° 336-882-2125   °[facility] [address],  °[place], [place] 1-800-853-5163 or 336-641-4981   °Substance Abuse Resources °Organization         Address  Phone  Notes  °[facility]  336-882-2125   °[facility]  336-784-9470   °[facility]  336-285-9073   °[facility]  336-845-3988   °Residential & Outpatient Substance Abuse Program  1-800-659-3381   °Psychological Services °Organization         Address  Phone  Notes  °[facility]  336- 832-9600   °[facility]  336- 378-7881   °[facility] [address], [place] 1-800-853-5163 or 336-641-4981   ° °Mobile Crisis Teams °Organization         Address  Phone  Notes  °[facility], Mobile Crisis Care Unit  1-877-626-1772   °Assertive °[facility] ° [address] [place], [place] 336-834-9664   °Sharon DeEsch [address] °[place] 336-554-5454   ° °Self-Help/Support Groups °Organization         Address  Phone             Notes  °[facility] -  variety of support groups  336- 373-1402 Call for more information  °Narcotics Anonymous (NA), [facility] [address], °[place]  2 meetings at this location  ° °Residential Treatment Programs °Organization         Address  Phone  Notes  °[facility] [address],    °[place]  1-866-801-8205   °[facility] ° [address], [place], [place] 704-293-8524   °[facility] [address], [place] 336-845-3988 Admissions: 8am-3pm M-F  °[facility] [address]    °[place], [place] 336-841-1104   °[facility] [address], [place], [place] 336-379-7146   °[facility] [address]  °[place], [place] 336-285-9073   °[facility] Intensive Outpatient [address], [place], [place] 336-852-3033   °[facility] [address]  °[place], [place] 1-877-615-2722 or 336-784-9470   °[facility] [address] [place], [place] 336-227-7417 Accepts Medicaid  °[facility] [address]  °[place] 1-800-659-3381 Substance Abuse/Addiction Treatment  ° °[place] Behavioral Health Resources °Organization         Address  Phone  Notes  °[facility]  (888) 581-9988   °Julie Brannon, PhD [address] [place], [place]   (336) 349-5553 or (336) 951-0000   °[facility]   [address] °[place], [place] (336) 349-4454   °[facility] [address], [place], [place] (336) 342-8316 Insurance/Medicaid/sponsorship through [facility]  °[facility] [address]                                    [place],  (336) 342-8316 Therapy/tele-psych/case  °[facility] [address]  ° [place], [place] (336) 349-2233    °Dr. Arfeen  (336) 349-4544   °[facility] [place]  [employer] [place] [facility] 1) [address], [place] °2) [address], [place] °3)  [address], [place] (336) 349-3220 °(336) 342-7768 ° °(336) 342-8140   °[place] [facility] (336) 342-1394 or (336) 342-3537 (After Hours)    ° ° ° °

## 2014-11-08 NOTE — ED Notes (Signed)
Pt stable, ambulatory, states understanding of discharge instructions 

## 2014-12-29 ENCOUNTER — Emergency Department (HOSPITAL_COMMUNITY)
Admission: EM | Admit: 2014-12-29 | Discharge: 2014-12-31 | Disposition: A | Discharge status: Home / Self Care | Payer: Self-pay | Attending: Emergency Medicine | Admitting: Emergency Medicine

## 2014-12-29 ENCOUNTER — Encounter (HOSPITAL_COMMUNITY): Payer: Self-pay | Admitting: Emergency Medicine

## 2014-12-29 DIAGNOSIS — F322 Major depressive disorder, single episode, severe without psychotic features: Secondary | ICD-10-CM | POA: Diagnosis present

## 2014-12-29 DIAGNOSIS — R45851 Suicidal ideations: Secondary | ICD-10-CM

## 2014-12-29 DIAGNOSIS — E1140 Type 2 diabetes mellitus with diabetic neuropathy, unspecified: Secondary | ICD-10-CM | POA: Insufficient documentation

## 2014-12-29 DIAGNOSIS — Z720 Tobacco use: Secondary | ICD-10-CM | POA: Insufficient documentation

## 2014-12-29 DIAGNOSIS — Z79899 Other long term (current) drug therapy: Secondary | ICD-10-CM | POA: Insufficient documentation

## 2014-12-29 DIAGNOSIS — F332 Major depressive disorder, recurrent severe without psychotic features: Secondary | ICD-10-CM | POA: Insufficient documentation

## 2014-12-29 LAB — COMPREHENSIVE METABOLIC PANEL
ALK PHOS: 99 U/L (ref 38–126)
ALT: 31 U/L (ref 17–63)
ANION GAP: 7 (ref 5–15)
AST: 28 U/L (ref 15–41)
Albumin: 4.2 g/dL (ref 3.5–5.0)
BUN: 11 mg/dL (ref 6–20)
CHLORIDE: 108 mmol/L (ref 101–111)
CO2: 25 mmol/L (ref 22–32)
CREATININE: 0.95 mg/dL (ref 0.61–1.24)
Calcium: 9.6 mg/dL (ref 8.9–10.3)
GFR calc non Af Amer: >60 mL/min (ref >60)
Glucose, Bld: 149 mg/dL — ABNORMAL HIGH (ref 65–99)
Potassium: 3.4 mmol/L — ABNORMAL LOW (ref 3.5–5.1)
Sodium: 140 mmol/L (ref 135–145)
Total Bilirubin: 1.2 mg/dL (ref 0.3–1.2)
Total Protein: 8.2 g/dL — ABNORMAL HIGH (ref 6.5–8.1)

## 2014-12-29 LAB — SALICYLATE LEVEL: Salicylate Lvl: <4 mg/dL (ref 2.8–30.0)

## 2014-12-29 LAB — RAPID URINE DRUG SCREEN, HOSP PERFORMED
Amphetamines: NOT DETECTED
Barbiturates: NOT DETECTED
Benzodiazepines: NOT DETECTED
Cocaine: NOT DETECTED
OPIATES: NOT DETECTED
TETRAHYDROCANNABINOL: NOT DETECTED

## 2014-12-29 LAB — CBC
HCT: 44.5 % (ref 39.0–52.0)
Hemoglobin: 15.6 g/dL (ref 13.0–17.0)
MCH: 29 pg (ref 26.0–34.0)
MCHC: 35.1 g/dL (ref 30.0–36.0)
MCV: 82.7 fL (ref 78.0–100.0)
PLATELETS: 193 10*3/uL (ref 150–400)
RBC: 5.38 MIL/uL (ref 4.22–5.81)
RDW: 12.8 % (ref 11.5–15.5)
WBC: 4.7 10*3/uL (ref 4.0–10.5)

## 2014-12-29 LAB — ETHANOL: Alcohol, Ethyl (B): <5 mg/dL (ref <5)

## 2014-12-29 LAB — ACETAMINOPHEN LEVEL

## 2014-12-29 MED ORDER — RISPERIDONE 1 MG PO TABS
1.0000 mg | ORAL_TABLET | Freq: Every day | ORAL | Status: DC
  Filled 2014-12-29: qty 1

## 2014-12-29 MED ORDER — BENZTROPINE MESYLATE 1 MG PO TABS
0.5000 mg | ORAL_TABLET | Freq: Two times a day (BID) | ORAL | Status: DC
  Administered 2014-12-30: 0.5 mg via ORAL
  Filled 2014-12-29 (×3): qty 1

## 2014-12-29 MED ORDER — RISPERIDONE MICROSPHERES 25 MG IM SUSR
25.0000 mg | INTRAMUSCULAR | Status: DC
  Filled 2014-12-29: qty 2

## 2014-12-29 MED ORDER — DIVALPROEX SODIUM ER 500 MG PO TB24
500.0000 mg | ORAL_TABLET | Freq: Three times a day (TID) | ORAL | Status: DC
  Administered 2014-12-29 – 2014-12-31 (×4): 500 mg via ORAL
  Filled 2014-12-29 (×8): qty 1

## 2014-12-29 MED ORDER — TRAZODONE HCL 50 MG PO TABS
50.0000 mg | ORAL_TABLET | Freq: Every day | ORAL | Status: DC
  Administered 2014-12-29 – 2014-12-30 (×2): 50 mg via ORAL
  Filled 2014-12-29 (×2): qty 1
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
## 2014-12-29 NOTE — ED Notes (Signed)
Pt AAO x 3, no distress noted, calm & cooperative, watching TV at present. Remains passive SI, monitoring for safety, Q 15 min checks in effect.

## 2014-12-29 NOTE — ED Provider Notes (Signed)
CSN: [id number]     Arrival date & time 12/29/14  1108 History   First MD Initiated Contact with Patient 12/29/14 1112     Chief Complaint  Patient presents with  . Suicidal  . Homicidal      HPI  Richard [name] presents after considering running into traffic as a suicide attempt today. He has a long history of suicidal ideations and attempts. Has never followed up on any attempts. However today he called 911 stating he was going to run into traffic. He was approached by a Event organiser. [place] [address]. When he was approached she moved into traffic. When the officer backed off he stepped back to the shoulder. Traffic was held upstream by [employer]. He was taken into custody without incident.  Past Medical History  Diagnosis Date  . Diabetes mellitus   . Hypertension   . Diabetic neuropathy    Past Surgical History  Procedure Laterality Date  . Mouth surgery    . Tympanostomy tube placement     No family history on file. History  Substance Use Topics  . Smoking status: Current Every Day Smoker    Types: Cigarettes  . Smokeless tobacco: Not on file  . Alcohol Use: No    Review of Systems  Constitutional: Negative for fever, chills, diaphoresis, appetite change and fatigue.  HENT: Negative for mouth sores, sore throat and trouble swallowing.   Eyes: Negative for visual disturbance.  Respiratory: Negative for cough, chest tightness, shortness of breath and wheezing.   Cardiovascular: Negative for chest pain.  Gastrointestinal: Negative for nausea, vomiting, abdominal pain, diarrhea and abdominal distention.  Endocrine: Negative for polydipsia, polyphagia and polyuria.  Genitourinary: Negative for dysuria, frequency and hematuria.  Musculoskeletal: Negative for gait problem.  Skin: Negative for color change, pallor and rash.  Neurological: Negative for dizziness, syncope, light-headedness and headaches.  Hematological: Does not bruise/bleed easily.   Psychiatric/Behavioral: Positive for suicidal ideas. Negative for behavioral problems and confusion.      Allergies  Review of patient's allergies indicates no known allergies.  Home Medications   Prior to Admission medications   Medication Sig Start Date End Date Taking? Authorizing Provider  benztropine (COGENTIN) 0.5 MG tablet Take 1 tablet (0.5 mg total) by mouth 2 (two) times daily. Patient not taking: Reported on 11/08/2014 03/06/14   Mojeed Akintayo  divalproex (DEPAKOTE ER) 500 MG 24 hr tablet Take 1 tablet (500 mg total) by mouth 3 (three) times daily before meals. Patient not taking: Reported on 12/29/2014 03/06/14   Mojeed Akintayo  HYDROcodone-acetaminophen (NORCO/VICODIN) 5-325 MG per tablet Take 1 tablet by mouth every 6 (six) hours as needed for moderate pain. Patient not taking: Reported on 12/29/2014 11/08/14   Elwin Mocha, MD  ibuprofen (ADVIL,MOTRIN) 800 MG tablet Take 1 tablet (800 mg total) by mouth every 8 (eight) hours as needed. Patient not taking: Reported on 12/29/2014 11/08/14   Elwin Mocha, MD  risperiDONE (RISPERDAL) 1 MG tablet Take 1 tablet (1 mg total) by mouth at bedtime. Patient not taking: Reported on 11/08/2014 03/06/14   Mojeed Akintayo  risperiDONE microspheres (RISPERDAL CONSTA) 25 MG injection Inject 2 mLs (25 mg total) into the muscle every 14 (fourteen) days. Patient not taking: Reported on 11/08/2014 03/06/14   Mojeed Akintayo  traZODone (DESYREL) 50 MG tablet Take 1 tablet (50 mg total) by mouth at bedtime. Patient not taking: Reported on 11/08/2014 03/06/14   Mojeed Akintayo   BP 142/91 mmHg  Pulse 65  Temp(Src) 99.1 F (37.3 C) (Oral)  Resp 16  SpO2 99% Physical Exam  Constitutional: He is oriented to person, place, and time. He appears well-developed and well-nourished. No distress.  HENT:  Head: Normocephalic.  Eyes: Conjunctivae are normal. Pupils are equal, round, and reactive to light. No scleral icterus.  Neck: Normal range of motion. Neck  supple. No thyromegaly present.  Cardiovascular: Normal rate and regular rhythm.  Exam reveals no gallop and no friction rub.   No murmur heard. Pulmonary/Chest: Effort normal and breath sounds normal. No respiratory distress. He has no wheezes. He has no rales.  Abdominal: Soft. Bowel sounds are normal. He exhibits no distension. There is no tenderness. There is no rebound.  Musculoskeletal: Normal range of motion.  Neurological: He is alert and oriented to person, place, and time.  Skin: Skin is warm and dry. No rash noted.  Psychiatric: He has a normal mood and affect. His behavior is normal.    ED Course  Procedures (including critical care time) Labs Review Labs Reviewed  COMPREHENSIVE METABOLIC PANEL - Abnormal; Notable for the following:    Potassium 3.4 (*)    Glucose, Bld 149 (*)    Total Protein 8.2 (*)    All other components within normal limits  ACETAMINOPHEN LEVEL - Abnormal; Notable for the following:    Acetaminophen (Tylenol), Serum <10 (*)    All other components within normal limits  ETHANOL  SALICYLATE LEVEL  CBC  URINE RAPID DRUG SCREEN, HOSP PERFORMED    Imaging Review No results found.   EKG Interpretation None      MDM   Final diagnoses:  None    Patient placed on IVC by [employer] prior to arrival. It completed first exam. He is awaiting TTS consultation.    Rolland Porter, MD 12/29/14 1259

## 2014-12-29 NOTE — ED Notes (Signed)
Pt w/ long hx of suicide attempts.  Was found by [employer] trying to walk out in front of traffic today.  Pt also has generalized HI thoughts but will not discuss plans with this Clinical research associate.  Denies drugs and alcohol.  Pending IVC by [employer].

## 2014-12-29 NOTE — ED Notes (Signed)
Pt reports he was on [address] and was attempting to walk in to traffic as a suicide attempt. Brought in and IVC'd by [employer]. Pt reports not wanting to be in hospital, that he has been evaluated and had inpt treatments many times and never wants treatment. Sts he always leaves AMA because he "can't handle being there." Reports HI towards particular people but no one in the hospital in vicinity of pt. Reports command auditory hallucinations, one "bad" unfamiliar voice, telling him to harm himself and kill other people. Denies etoh, rec drug use. Pt appears forthcoming with information but does state he doesn't want to talk much about the situation, but continues to answer questions. TTS at bedside assessing currently. Pt appears cooperative. [employer] remains at bedside currently.

## 2014-12-29 NOTE — ED Notes (Signed)
Patient and belongings were wanded by security 

## 2014-12-29 NOTE — ED Notes (Signed)
Patient is sad,flat and depressed.  States he is open to inpt treatment.  States "I just have a lot of issues".  Support offered and 15' checks initiated.

## 2014-12-30 DIAGNOSIS — R45851 Suicidal ideations: Secondary | ICD-10-CM

## 2014-12-30 DIAGNOSIS — F332 Major depressive disorder, recurrent severe without psychotic features: Secondary | ICD-10-CM

## 2014-12-30 LAB — CBG MONITORING, ED: GLUCOSE-CAPILLARY: 108 mg/dL — AB (ref 65–99)

## 2014-12-30 NOTE — Progress Notes (Signed)
Disposition CSW completed referrals to the following Psych Facilities:  [facility]  CSW will continue to assist with patient's placement needs.  Seward Speck [facility] Disposition CSW [phone]

## 2014-12-30 NOTE — ED Notes (Signed)
Requesting d/c. [facility] information reviewed.

## 2014-12-30 NOTE — ED Notes (Signed)
Richard [name] has been isolative and guarded this shift.  POC explained to him.  Compliant with medications and support offered.

## 2014-12-30 NOTE — ED Notes (Signed)
Pt AAO x 3, no distress noted, calm & cooperative, watching TV at present.  Remains passive SI.  Monitoring for safety, Q 15 min checks in effect. 

## 2014-12-30 NOTE — Consult Note (Signed)
[facility] Face-to-Face Psychiatry Consult   Reason for Consult:  Recurrent Major depressive disorder, severe without Psychosis  medication non compliant,Treatment non compliant Referring Physician:  EDP Patient Identification: Clarion Mooneyhan MRN:  [id number] Principal Diagnosis: Severe major depression without psychotic features Diagnosis:   Patient Active Problem List   Diagnosis Date Noted  . Major depressive disorder, single episode, severe without psychotic features [F32.2]     Priority: High  . Uncomplicated opioid dependence [F11.20]   . Opiate dependence [F11.20] 07/10/2014  . Severe major depression,recurrent without psychotic features [F32.2] 01/22/2014  . Suicidal ideations [R45.851] 10/27/2013    Total Time spent with patient: 45 minutes  Subjective:   Richard [name] is a 44 y.o. male patient admitted with MDD, Recurrent severe, medication non compliant,Treatment non compliant.  HPI:  AA male, 44 years old was evaluated for suicidal thought and exacerbation of depression.  Patient reports that his stressors includes his wife leaving him for another man and that she has a baby for the new man.  Patient reports that his wife has their two daughter.  Patient gives this hx each visit to the ER.  He states he still love his wife and is not ready for divorce.  Patient reports that he has not been taking his MH medications because that is not what he needs.  Patient was not able to say what he needed from the providers for this visit.  He stopped taking his medications and has not seen his providers in the community.  Patient became irritable when asked again what providers can do if he is refusing medications, counseling and does not  Follow up with his providers.  We will keep him overnight and re-evaluate in am.  HPI Elements:   Location:  MDD, RECURRENT SEVERE. Quality:  severe. Severity:  severe. Timing:  acute. Duration:  Chronic mental illness, . Context:  Seeking MH care but refusing  medications and counseling..  Past Medical History:  Past Medical History  Diagnosis Date  . Diabetes mellitus   . Hypertension   . Diabetic neuropathy     Past Surgical History  Procedure Laterality Date  . Mouth surgery    . Tympanostomy tube placement     Family History: No family history on file. Social History:  History  Alcohol Use No    Comment: Patient denies      History  Drug Use No    Comment: Patient denies current use     History   Social History  . Marital Status: Single    Spouse Name: N/A  . Number of Children: N/A  . Years of Education: N/A   Social History Main Topics  . Smoking status: Current Every Day Smoker    Types: Cigarettes  . Smokeless tobacco: Not on file  . Alcohol Use: No     Comment: Patient denies   . Drug Use: No     Comment: Patient denies current use   . Sexual Activity: Not on file   Other Topics Concern  . None   Social History Narrative   Additional Social History:    History of alcohol / drug use?: No history of alcohol / drug abuse                     Allergies:  No Known Allergies  Labs:  Results for orders placed or performed during the hospital encounter of 12/29/14 (from the past 48 hour(s))  Comprehensive metabolic panel     Status:  Abnormal   Collection Time: 12/29/14 11:25 AM  Result Value Ref Range   Sodium 140 135 - 145 mmol/L   Potassium 3.4 (L) 3.5 - 5.1 mmol/L   Chloride 108 101 - 111 mmol/L   CO2 25 22 - 32 mmol/L   Glucose, Bld 149 (H) 65 - 99 mg/dL   BUN 11 6 - 20 mg/dL   Creatinine, Ser 0.95 0.61 - 1.24 mg/dL   Calcium 9.6 8.9 - 10.3 mg/dL   Total Protein 8.2 (H) 6.5 - 8.1 g/dL   Albumin 4.2 3.5 - 5.0 g/dL   AST 28 15 - 41 U/L   ALT 31 17 - 63 U/L   Alkaline Phosphatase 99 38 - 126 U/L   Total Bilirubin 1.2 0.3 - 1.2 mg/dL   GFR calc non Af Amer >60 >60 mL/min   GFR calc Af Amer >60 >60 mL/min    Comment: (NOTE) The eGFR has been calculated using the CKD EPI equation. This  calculation has not been validated in all clinical situations. eGFR's persistently <60 mL/min signify possible Chronic Kidney Disease.    Anion gap 7 5 - 15  CBC     Status: None   Collection Time: 12/29/14 11:25 AM  Result Value Ref Range   WBC 4.7 4.0 - 10.5 K/uL   RBC 5.38 4.22 - 5.81 MIL/uL   Hemoglobin 15.6 13.0 - 17.0 g/dL   HCT 44.5 39.0 - 52.0 %   MCV 82.7 78.0 - 100.0 fL   MCH 29.0 26.0 - 34.0 pg   MCHC 35.1 30.0 - 36.0 g/dL   RDW 12.8 11.5 - 15.5 %   Platelets 193 150 - 400 K/uL  Ethanol (ETOH)     Status: None   Collection Time: 12/29/14 11:27 AM  Result Value Ref Range   Alcohol, Ethyl (B) <5 <5 mg/dL    Comment:        LOWEST DETECTABLE LIMIT FOR SERUM ALCOHOL IS 5 mg/dL FOR MEDICAL PURPOSES ONLY   Salicylate level     Status: None   Collection Time: 12/29/14 11:27 AM  Result Value Ref Range   Salicylate Lvl <1.9 2.8 - 30.0 mg/dL  Acetaminophen level     Status: Abnormal   Collection Time: 12/29/14 11:27 AM  Result Value Ref Range   Acetaminophen (Tylenol), Serum <10 (L) 10 - 30 ug/mL    Comment:        THERAPEUTIC CONCENTRATIONS VARY SIGNIFICANTLY. A RANGE OF 10-30 ug/mL MAY BE AN EFFECTIVE CONCENTRATION FOR MANY PATIENTS. HOWEVER, SOME ARE BEST TREATED AT CONCENTRATIONS OUTSIDE THIS RANGE. ACETAMINOPHEN CONCENTRATIONS >150 ug/mL AT 4 HOURS AFTER INGESTION AND >50 ug/mL AT 12 HOURS AFTER INGESTION ARE OFTEN ASSOCIATED WITH TOXIC REACTIONS.   Urine rapid drug screen (hosp performed) (Not at [facility])     Status: None   Collection Time: 12/29/14 11:40 AM  Result Value Ref Range   Opiates NONE DETECTED NONE DETECTED   Cocaine NONE DETECTED NONE DETECTED   Benzodiazepines NONE DETECTED NONE DETECTED   Amphetamines NONE DETECTED NONE DETECTED   Tetrahydrocannabinol NONE DETECTED NONE DETECTED   Barbiturates NONE DETECTED NONE DETECTED    Comment:        DRUG SCREEN FOR MEDICAL PURPOSES ONLY.  IF CONFIRMATION IS NEEDED FOR ANY PURPOSE, NOTIFY  LAB WITHIN 5 DAYS.        LOWEST DETECTABLE LIMITS FOR URINE DRUG SCREEN Drug Class       Cutoff (ng/mL) Amphetamine      1000 Barbiturate  200 Benzodiazepine   725 Tricyclics       366 Opiates          300 Cocaine          300 THC              50   CBG monitoring, ED     Status: Abnormal   Collection Time: 12/30/14  3:25 AM  Result Value Ref Range   Glucose-Capillary 108 (H) 65 - 99 mg/dL   Comment 1 Notify RN    Comment 2 Document in Chart     Vitals: Blood pressure 114/61, pulse 81, temperature 97.1 F (36.2 C), temperature source Oral, resp. rate 18, SpO2 100 %.  Risk to Self: Suicidal Ideation: Yes-Currently Present Suicidal Intent: Yes-Currently Present Is patient at risk for suicide?: Yes Suicidal Plan?: Yes-Currently Present Specify Current Suicidal Plan: Attemp to walk out in traffic Access to Means: Yes Specify Access to Suicidal Means: Access to roads and cars What has been your use of drugs/alcohol within the last 12 months?: None How many times?: 5 Triggers for Past Attempts: Unpredictable Intentional Self Injurious Behavior: None Risk to Others: Homicidal Ideation: Yes-Currently Present Thoughts of Harm to Others: Yes-Currently Present Comment - Thoughts of Harm to Others: Thoughts to harm others-unidentified Current Homicidal Intent: No-Not Currently/Within Last 6 Months Current Homicidal Plan: No-Not Currently/Within Last 6 Months Access to Homicidal Means: No Identified Victim: None History of harm to others?: No Assessment of Violence: None Noted Violent Behavior Description: Patient is calm and cooperative Does patient have access to weapons?: No Criminal Charges Pending?: No Does patient have a court date: No Prior Inpatient Therapy: Prior Inpatient Therapy: Yes Prior Therapy Dates: 2016 Prior Therapy Facilty/Provider(s): [facility] Reason for Treatment: Depression Prior Outpatient Therapy: Prior Outpatient Therapy: Yes Prior Therapy Dates:  2016 Prior Therapy Facilty/Provider(s): [facility] Reason for Treatment: Medications Does patient have an ACCT team?: No Does patient have Intensive In-House Services?  : No Does patient have [facility] services? : Yes Does patient have P4CC services?: Unknown  Current Facility-Administered Medications  Medication Dose Route Frequency Provider Last Rate Last Dose  . benztropine (COGENTIN) tablet 0.5 mg  0.5 mg Oral BID Tanna Furry, MD   0.5 mg at 12/30/14 1117  . divalproex (DEPAKOTE ER) 24 hr tablet 500 mg  500 mg Oral TID AC Tanna Furry, MD   500 mg at 12/30/14 1647  . risperiDONE (RISPERDAL) tablet 1 mg  1 mg Oral QHS Tanna Furry, MD   1 mg at 12/29/14 2129  . risperiDONE microspheres (RISPERDAL CONSTA) injection 25 mg  25 mg Intramuscular Q14 Days Tanna Furry, MD   25 mg at 12/29/14 1623  . traZODone (DESYREL) tablet 50 mg  50 mg Oral QHS Tanna Furry, MD   50 mg at 12/29/14 2121   Current Outpatient Prescriptions  Medication Sig Dispense Refill  . benztropine (COGENTIN) 0.5 MG tablet Take 1 tablet (0.5 mg total) by mouth 2 (two) times daily. (Patient not taking: Reported on 11/08/2014) 60 tablet 0  . divalproex (DEPAKOTE ER) 500 MG 24 hr tablet Take 1 tablet (500 mg total) by mouth 3 (three) times daily before meals. (Patient not taking: Reported on 12/29/2014) 90 tablet 0  . HYDROcodone-acetaminophen (NORCO/VICODIN) 5-325 MG per tablet Take 1 tablet by mouth every 6 (six) hours as needed for moderate pain. (Patient not taking: Reported on 12/29/2014) 10 tablet 0  . ibuprofen (ADVIL,MOTRIN) 800 MG tablet Take 1 tablet (800 mg total) by mouth every 8 (eight)  hours as needed. (Patient not taking: Reported on 12/29/2014) 10 tablet 0  . risperiDONE (RISPERDAL) 1 MG tablet Take 1 tablet (1 mg total) by mouth at bedtime. (Patient not taking: Reported on 11/08/2014) 30 tablet 0  . risperiDONE microspheres (RISPERDAL CONSTA) 25 MG injection Inject 2 mLs (25 mg total) into the muscle every 14 (fourteen) days.  (Patient not taking: Reported on 11/08/2014) 1 each 3  . traZODone (DESYREL) 50 MG tablet Take 1 tablet (50 mg total) by mouth at bedtime. (Patient not taking: Reported on 11/08/2014) 30 tablet 0    Musculoskeletal: Strength & Muscle Tone: within normal limits Gait & Station: normal Patient leans: N/A  Psychiatric Specialty Exam: Physical Exam  Review of Systems  Constitutional: Negative.   HENT: Negative.   Eyes: Negative.   Respiratory: Negative.   Cardiovascular: Negative.   Gastrointestinal: Negative.   Genitourinary: Negative.   Musculoskeletal: Negative.   Skin: Negative.   Neurological: Negative.   Endo/Heme/Allergies: Negative.     Blood pressure 114/61, pulse 81, temperature 97.1 F (36.2 C), temperature source Oral, resp. rate 18, SpO2 100 %.There is no weight on file to calculate BMI.  General Appearance: Casual and Fairly Groomed  Eye Contact::  Minimal  Speech:  Clear and Coherent and Normal Rate  Volume:  Normal  Mood:  Angry and Depressed  Affect:  Congruent  Thought Process:  Coherent, Goal Directed and Intact  Orientation:  Full (Time, Place, and Person)  Thought Content:  WDL  Suicidal Thoughts:  Yes.  without intent/plan  Homicidal Thoughts:  No  Memory:  Immediate;   Good Recent;   Good Remote;   Good  Judgement:  Poor  Insight:  Shallow  Psychomotor Activity:  Normal  Concentration:  Good  Recall:  NA  Fund of Knowledge:Poor  Language: Good  Akathisia:  NA  Handed:  Right  AIMS (if indicated):     Assets:  Desire for Improvement  ADL's:  Intact  Cognition: WNL  Sleep:      Medical Decision Making: Review of Psycho-Social Stressors (1)  Treatment Plan Summary: Daily contact with patient to assess and evaluate symptoms and progress in treatment and Medication management  Plan:  Resume home medications. Disposition: Observe overnight and re-evaluate in am  Delfin Gant   PMHNP-BC 12/30/2014 5:47 PM  Patient seen face-to-face for  psychiatric evaluation along with psychiatric nurse practitioner and case discussed with the treatment team. Formulated treatment plan and reviewed the information documented and agree with the treatment plan. Khallid Pasillas,JANARDHAHA R. 12/30/2014 7:33 PM

## 2014-12-31 DIAGNOSIS — F332 Major depressive disorder, recurrent severe without psychotic features: Secondary | ICD-10-CM | POA: Insufficient documentation

## 2014-12-31 MED ORDER — RISPERIDONE 1 MG PO TABS
1.0000 mg | ORAL_TABLET | Freq: Every day | ORAL | Status: DC
Start: 2014-12-31 — End: 2015-10-20

## 2014-12-31 MED ORDER — DIVALPROEX SODIUM ER 500 MG PO TB24: 500.0000 mg | ORAL_TABLET | Freq: Three times a day (TID) | ORAL | Status: DC

## 2014-12-31 NOTE — BHH Suicide Risk Assessment (Signed)
[facility] Discharge Suicide Risk Assessment  Richard [name] is an 44 y.o. male who presented under IVC due to suicidal ideations with attempt by walking into traffic. Patient was transported to [facility] emergency department by [facility].  Patient reports that he's been depressed for several years now, adds that his stress has been his relationship with his wife. On being asked to elaborate, patient reports that his wife is living with her boyfriend, does not let him have [employer] his children and he finds this frustrating. He states that he gets overwhelmed because of this and also because his brother suffering from cancer. He adds that he's been suicidal for many years now, currently this morning has no plans of hurting himself and does want help so he can have [employer] his children. He states that he would never completed suicide because of his sense of responsibility to his children. He also reports that he cannot find a job and is currently living with his father in Social worker.  Discussed in length with patient the need for him to have a plan outpatient to help him stabilize and work towards his goal of finding a job. Information about the [facility] along with follow-up to [facility] was given to the patient.  Demographic Factors:  Male, Low socioeconomic status, Living alone and Unemployed  Total Time spent with patient: 30 minutes  Musculoskeletal: Strength & Muscle Tone: within normal limits Gait & Station: normal Patient leans: N/A  Psychiatric Specialty Exam: Physical Exam  Review of Systems  Constitutional: Negative.  Negative for fever, weight loss and malaise/fatigue.  HENT: Negative.  Negative for congestion and sore throat.   Eyes: Negative.  Negative for blurred vision, double vision and discharge.  Respiratory: Negative.  Negative for cough, shortness of breath and wheezing.   Cardiovascular: Negative.  Negative for chest pain, palpitations and PND.   Gastrointestinal: Negative.  Negative for heartburn, nausea, vomiting and constipation.  Genitourinary: Negative.  Negative for dysuria and frequency.  Musculoskeletal: Negative.  Negative for myalgias and falls.  Skin: Negative.  Negative for rash.  Neurological: Negative.  Negative for dizziness, focal weakness, seizures and headaches.  Endo/Heme/Allergies: Negative.  Negative for environmental allergies.  Psychiatric/Behavioral: Positive for depression and suicidal ideas. Negative for hallucinations and memory loss. The patient is not nervous/anxious and does not have insomnia.        Chronic SI but no plans to act on them    Blood pressure 104/60, pulse 70, temperature 98.2 F (36.8 C), temperature source Oral, resp. rate 15, SpO2 99 %.There is no weight on file to calculate BMI.  General Appearance: Casual  Eye Contact::  Fair  Speech:  Clear and Coherent and Normal Rate  Volume:  Normal  Mood:  Dysphoric and Euthymic  Affect:  Appropriate, Congruent and Full Range  Thought Process:  Goal Directed and Intact  Orientation:  Full (Time, Place, and Person)  Thought Content:  WDL  Suicidal Thoughts:  Yes.  without intent/plan  Homicidal Thoughts:  No  Memory:  Immediate;   Fair Recent;   Fair Remote;   Fair  Judgement:  Poor  Insight:  Shallow  Psychomotor Activity:  Normal  Concentration:  Fair  Recall:  [employer] of Knowledge:Fair  Language: Fair  Akathisia:  No  Handed:  Right  AIMS (if indicated):     Assets:  Communication Skills Physical Health  Sleep:     Cognition: WNL  ADL's:  Intact  Has this patient used any form of tobacco in the last 30 days? (Cigarettes, Smokeless Tobacco, Cigars, and/or Pipes) Yes, Prescription not provided because: as patient refused  Mental Status Per Nursing Assessment::   On Admission:     Current Mental Status by Physician: NA  Loss Factors: Loss of significant relationship  Historical Factors: Impulsivity  Risk  Reduction Factors:   Sense of responsibility to family  Continued Clinical Symptoms:  More than one psychiatric diagnosis Unstable or Poor Therapeutic Relationship Previous Psychiatric Diagnoses and Treatments  Cognitive Features That Contribute To Risk:  Thought constriction (tunnel vision)    Suicide Risk:  Moderate:  Frequent suicidal ideation with limited intensity, and duration, some specificity in terms of plans, no associated intent, good self-control, limited dysphoria/symptomatology, some risk factors present, and identifiable protective factors, including available and accessible social support.  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 Uncomplicated opioid dependence [F11.20]   . Major depressive disorder, single episode, severe without psychotic features [F32.2]   . Opiate dependence [F11.20] 07/10/2014  . Severe major depression,recurrent without psychotic features [F32.2] 01/22/2014  . Suicidal ideations [R45.851] 10/27/2013      Plan Of Care/Follow-up recommendations:  Activity:  as tolerated Diet:  regular Other:  Follow up with [facility]  Is patient on multiple antipsychotic therapies at discharge:  No   Has Patient had three or more failed trials of antipsychotic monotherapy by history:  No  Recommended Plan for Multiple Antipsychotic Therapies: NA    Maxtyn Nuzum 12/31/2014, 11:48 AM

## 2014-12-31 NOTE — Discharge Instructions (Addendum)
For your ongoing mental health needs, you are advised to follow up with [facility].  If you do not currently have an appointment, new and returning patients are seen at their walk-in clinic.  Walk-in hours are Monday - Friday from 8:00 am - 3:00 pm.  Walk-in patients are seen on a first come, first served basis.  Try to arrive as early as possible for he best chance of being seen the same day:       [facility]      [address]      [place], [place] [address]      [phone]   For legal issues related to family conflict, contact the [facility]:       [facility]      [address], [place] [address]      [phone]

## 2014-12-31 NOTE — ED Notes (Signed)
Patient discharged to home.  Denies thoughts of harm to self or others.  He was given information on follow up with [facility] as well as the [facility].  He left the unit ambulatory with all belongings.  He was also given a [place] bus pass.

## 2014-12-31 NOTE — Progress Notes (Signed)
Per discussion with psychiatrist, regarding patient having issues with seeing his children. CSW provided patient with resources for [facility] Patient plans to follow up once psychiatrically stable. Pt in agreemnt to talk with psychiatrist further about disposition.   Olga Coaster, LCSW  Clinical Social Work  [employer] [phone]

## 2014-12-31 NOTE — BH Assessment (Signed)
BHH Assessment Progress Note  Per Nelly Rout, MD, this pt does not require psychiatric hospitalization at this time.  He is to be released from [facility] and discharged from [facility] with outpatient resources.  IVC has been rescinded.  Pt's discharge instructions include referral information for [facility], as well as to the [facility] to address his child visitation concerns.  Pt's nurse has been notified.  Doylene Canning, MA Triage Specialist [phone]

## 2015-02-14 ENCOUNTER — Emergency Department (HOSPITAL_COMMUNITY)
Admission: EM | Admit: 2015-02-14 | Discharge: 2015-02-14 | Disposition: A | Discharge status: Home / Self Care | Payer: Self-pay | Attending: Emergency Medicine | Admitting: Emergency Medicine

## 2015-02-14 ENCOUNTER — Emergency Department (HOSPITAL_COMMUNITY): Payer: Self-pay

## 2015-02-14 ENCOUNTER — Encounter (HOSPITAL_COMMUNITY): Payer: Self-pay | Admitting: Emergency Medicine

## 2015-02-14 DIAGNOSIS — Z720 Tobacco use: Secondary | ICD-10-CM | POA: Insufficient documentation

## 2015-02-14 DIAGNOSIS — R0789 Other chest pain: Secondary | ICD-10-CM | POA: Insufficient documentation

## 2015-02-14 DIAGNOSIS — Z79899 Other long term (current) drug therapy: Secondary | ICD-10-CM | POA: Insufficient documentation

## 2015-02-14 DIAGNOSIS — I10 Essential (primary) hypertension: Secondary | ICD-10-CM | POA: Insufficient documentation

## 2015-02-14 DIAGNOSIS — E1140 Type 2 diabetes mellitus with diabetic neuropathy, unspecified: Secondary | ICD-10-CM | POA: Insufficient documentation

## 2015-02-14 LAB — BASIC METABOLIC PANEL
Anion gap: 9 (ref 5–15)
BUN: 6 mg/dL (ref 6–20)
CHLORIDE: 105 mmol/L (ref 101–111)
CO2: 26 mmol/L (ref 22–32)
Calcium: 9.4 mg/dL (ref 8.9–10.3)
Creatinine, Ser: 0.9 mg/dL (ref 0.61–1.24)
GFR calc Af Amer: >60 mL/min (ref >60)
GLUCOSE: 94 mg/dL (ref 65–99)
Potassium: 3.5 mmol/L (ref 3.5–5.1)
Sodium: 140 mmol/L (ref 135–145)

## 2015-02-14 LAB — CBC
HEMATOCRIT: 44 % (ref 39.0–52.0)
Hemoglobin: 15.3 g/dL (ref 13.0–17.0)
MCH: 28.8 pg (ref 26.0–34.0)
MCHC: 34.8 g/dL (ref 30.0–36.0)
MCV: 82.9 fL (ref 78.0–100.0)
Platelets: 193 10*3/uL (ref 150–400)
RBC: 5.31 MIL/uL (ref 4.22–5.81)
RDW: 12.6 % (ref 11.5–15.5)
WBC: 5.1 10*3/uL (ref 4.0–10.5)

## 2015-02-14 LAB — I-STAT TROPONIN, ED
TROPONIN I, POC: 0.01 ng/mL (ref 0.00–0.08)
Troponin i, poc: 0.01 ng/mL (ref 0.00–0.08)

## 2015-02-14 NOTE — ED Notes (Signed)
MD at bedside. 

## 2015-02-14 NOTE — ED Notes (Signed)
Patient here with complaint of central chest pain beginning last night after going to bed. Endorses eating "lots of heavy food" just prior to bedtime. Pain woke him from sleep and was described as a "deep strong pain". Denies other symptoms or history of heart problems.

## 2015-02-14 NOTE — ED Notes (Signed)
EMT advised RN that patient states he was walking out; RN in room with critical patient at the time; EMT advised 5 minutes later that Pt has walked out AMA; MD notified

## 2015-02-14 NOTE — ED Notes (Signed)
Pt refused IV until seen by MD.

## 2015-02-14 NOTE — Discharge Instructions (Signed)

## 2015-02-14 NOTE — ED Provider Notes (Addendum)
CSN: [id number]     Arrival date & time 02/14/15  1503 History   First MD Initiated Contact with Patient 02/14/15 1643     Chief Complaint  Patient presents with  . Chest Pain     (Consider location/radiation/quality/duration/timing/severity/associated sxs/prior Treatment) Patient is a 44 y.o. male presenting with chest pain. The history is provided by the patient.  Chest Pain Pain location:  Substernal area Pain quality: dull and sharp   Pain radiates to:  Does not radiate Pain radiates to the back: no   Pain severity:  Moderate Onset quality:  Sudden Duration:  1 day Timing:  Constant Progression:  Unchanged Chronicity:  New Context: not breathing, no movement and no trauma   Associated symptoms: no diaphoresis, no dizziness, no nausea, no numbness, no palpitations and no shortness of breath   Risk factors: diabetes mellitus, hypertension and smoking   Risk factors: no aortic disease, no coronary artery disease, no Ehlers-Danlos syndrome and no Marfan's syndrome     Past Medical History  Diagnosis Date  . Diabetes mellitus   . Hypertension   . Diabetic neuropathy    Past Surgical History  Procedure Laterality Date  . Mouth surgery    . Tympanostomy tube placement     History reviewed. No pertinent family history. Social History  Substance Use Topics  . Smoking status: Current Every Day Smoker    Types: Cigarettes  . Smokeless tobacco: None  . Alcohol Use: No     Comment: Patient denies     Review of Systems  Constitutional: Negative for diaphoresis.  Respiratory: Negative for shortness of breath.   Cardiovascular: Positive for chest pain. Negative for palpitations.  Gastrointestinal: Negative for nausea.  Neurological: Negative for dizziness and numbness.      Allergies  Review of patient's allergies indicates no known allergies.  Home Medications   Prior to Admission medications   Medication Sig Start Date End Date Taking? Authorizing Provider   divalproex (DEPAKOTE ER) 500 MG 24 hr tablet Take 1 tablet (500 mg total) by mouth 3 (three) times daily before meals. 12/31/14  Yes Nelly Rout, MD  lisinopril (PRINIVIL,ZESTRIL) 10 MG tablet Take 10 mg by mouth daily.   Yes Historical Provider, MD  metFORMIN (GLUCOPHAGE) 500 MG tablet Take 500 mg by mouth 2 (two) times daily with a meal.   Yes Historical Provider, MD  risperiDONE (RISPERDAL) 1 MG tablet Take 1 tablet (1 mg total) by mouth at bedtime. 12/31/14  Yes Nelly Rout, MD   BP 117/75 mmHg  Pulse 57  Temp(Src) 98.3 F (36.8 C) (Oral)  Resp 18  Ht  (1.727 m)  Wt 238 lb (107.956 kg)  BMI 36.20 kg/m2  SpO2 98% Physical Exam  Constitutional: He is oriented to person, place, and time. He appears well-developed.  HENT:  Head: Normocephalic and atraumatic.  Eyes: Conjunctivae and EOM are normal. Pupils are equal, round, and reactive to light.  Neck: Normal range of motion. Neck supple.  Cardiovascular: Normal rate and regular rhythm.   Pulmonary/Chest: Effort normal and breath sounds normal.  Abdominal: Soft. Bowel sounds are normal. He exhibits no distension. There is no tenderness. There is no rebound and no guarding.  Neurological: He is alert and oriented to person, place, and time.  Skin: Skin is warm.    ED Course  Procedures (including critical care time) Labs Review Labs Reviewed  BASIC METABOLIC PANEL  CBC  I-STAT TROPOININ, ED  I-STAT TROPOININ, ED    Imaging Review No  results found. I have personally reviewed and evaluated these images and lab results as part of my medical decision-making.   EKG Interpretation   Date/Time:  Thursday February 14 2015 15:05:03 EDT Ventricular Rate:  89 PR Interval:  144 QRS Duration: 82 QT Interval:  374 QTC Calculation: 455 R Axis:   56 Text Interpretation:  Normal sinus rhythm Nonspecific T wave abnormality  Abnormal ECG No acute changes No significant change since last tracing  Confirmed by Rhunette Croft, MD,  Emmet Messer [phone]) on 02/14/2015 7:32:07 PM      MDM   Final diagnoses:  Atypical chest pain    Pt with midsternal and substernal chest pain. Constant, atypical. HEAR score is 2 for the risk factors. Will get trops x 2. Pt denies CAD hx, denies substance abuse.  Pain started last night after he ate a meal last night, but the pain has persisted. Pain is nor pleuritic or reproducible with palpation - so i think costochondritis and PE is less likely.    Derwood Kaplan, MD 02/17/15 0086  Derwood Kaplan, MD 03/12/15 2350

## 2015-10-17 ENCOUNTER — Encounter (HOSPITAL_COMMUNITY): Payer: Self-pay | Admitting: *Deleted

## 2015-10-17 ENCOUNTER — Emergency Department (HOSPITAL_COMMUNITY)
Admission: EM | Admit: 2015-10-17 | Discharge: 2015-10-17 | Disposition: A | Discharge status: Other Acute Inpatient Hospital | Payer: No Typology Code available for payment source | Attending: Emergency Medicine | Admitting: Emergency Medicine

## 2015-10-17 ENCOUNTER — Inpatient Hospital Stay (HOSPITAL_COMMUNITY)
Admission: AD | Admit: 2015-10-17 | Discharge: 2015-10-20 | DRG: 885 | Disposition: A | Discharge status: Home / Self Care | Payer: No Typology Code available for payment source | Source: Intra-hospital | Attending: Psychiatry | Admitting: Psychiatry

## 2015-10-17 ENCOUNTER — Encounter (HOSPITAL_COMMUNITY): Payer: Self-pay | Admitting: Nurse Practitioner

## 2015-10-17 DIAGNOSIS — Z79899 Other long term (current) drug therapy: Secondary | ICD-10-CM | POA: Insufficient documentation

## 2015-10-17 DIAGNOSIS — E1140 Type 2 diabetes mellitus with diabetic neuropathy, unspecified: Secondary | ICD-10-CM | POA: Diagnosis present

## 2015-10-17 DIAGNOSIS — R45851 Suicidal ideations: Secondary | ICD-10-CM | POA: Diagnosis present

## 2015-10-17 DIAGNOSIS — F17210 Nicotine dependence, cigarettes, uncomplicated: Secondary | ICD-10-CM | POA: Diagnosis present

## 2015-10-17 DIAGNOSIS — F332 Major depressive disorder, recurrent severe without psychotic features: Principal | ICD-10-CM | POA: Diagnosis present

## 2015-10-17 DIAGNOSIS — F329 Major depressive disorder, single episode, unspecified: Secondary | ICD-10-CM | POA: Insufficient documentation

## 2015-10-17 DIAGNOSIS — I10 Essential (primary) hypertension: Secondary | ICD-10-CM | POA: Diagnosis present

## 2015-10-17 DIAGNOSIS — G4700 Insomnia, unspecified: Secondary | ICD-10-CM | POA: Diagnosis present

## 2015-10-17 DIAGNOSIS — Z7984 Long term (current) use of oral hypoglycemic drugs: Secondary | ICD-10-CM | POA: Insufficient documentation

## 2015-10-17 LAB — COMPREHENSIVE METABOLIC PANEL
ALBUMIN: 4 g/dL (ref 3.5–5.0)
ALK PHOS: 109 U/L (ref 38–126)
ALT: 26 U/L (ref 17–63)
ANION GAP: 12 (ref 5–15)
AST: 22 U/L (ref 15–41)
BILIRUBIN TOTAL: 1.5 mg/dL — AB (ref 0.3–1.2)
BUN: 5 mg/dL — AB (ref 6–20)
CALCIUM: 9.5 mg/dL (ref 8.9–10.3)
CO2: 25 mmol/L (ref 22–32)
CREATININE: 1.01 mg/dL (ref 0.61–1.24)
Chloride: 105 mmol/L (ref 101–111)
GFR calc Af Amer: >60 mL/min (ref >60)
GFR calc non Af Amer: >60 mL/min (ref >60)
GLUCOSE: 166 mg/dL — AB (ref 65–99)
Potassium: 3.3 mmol/L — ABNORMAL LOW (ref 3.5–5.1)
Sodium: 142 mmol/L (ref 135–145)
TOTAL PROTEIN: 7.4 g/dL (ref 6.5–8.1)

## 2015-10-17 LAB — SALICYLATE LEVEL: Salicylate Lvl: <4 mg/dL (ref 2.8–30.0)

## 2015-10-17 LAB — CBC
HEMATOCRIT: 43.5 % (ref 39.0–52.0)
Hemoglobin: 15.1 g/dL (ref 13.0–17.0)
MCH: 28.8 pg (ref 26.0–34.0)
MCHC: 34.7 g/dL (ref 30.0–36.0)
MCV: 82.9 fL (ref 78.0–100.0)
Platelets: 213 10*3/uL (ref 150–400)
RBC: 5.25 MIL/uL (ref 4.22–5.81)
RDW: 13.1 % (ref 11.5–15.5)
WBC: 5.6 10*3/uL (ref 4.0–10.5)

## 2015-10-17 LAB — RAPID URINE DRUG SCREEN, HOSP PERFORMED
Amphetamines: NOT DETECTED
BARBITURATES: NOT DETECTED
Benzodiazepines: NOT DETECTED
Cocaine: NOT DETECTED
Opiates: NOT DETECTED
TETRAHYDROCANNABINOL: NOT DETECTED

## 2015-10-17 LAB — ACETAMINOPHEN LEVEL

## 2015-10-17 LAB — ETHANOL: Alcohol, Ethyl (B): <5 mg/dL (ref <5)

## 2015-10-17 MED ORDER — ZOLPIDEM TARTRATE 5 MG PO TABS: 5.0000 mg | ORAL_TABLET | Freq: Every evening | ORAL | Status: DC | PRN

## 2015-10-17 MED ORDER — ONDANSETRON HCL 4 MG PO TABS: 4.0000 mg | ORAL_TABLET | Freq: Three times a day (TID) | ORAL | Status: DC | PRN

## 2015-10-17 MED ORDER — IBUPROFEN 400 MG PO TABS
600.0000 mg | ORAL_TABLET | Freq: Three times a day (TID) | ORAL | Status: DC | PRN
  Administered 2015-10-17: 600 mg via ORAL
  Filled 2015-10-17: qty 1

## 2015-10-17 MED ORDER — METFORMIN HCL 500 MG PO TABS
500.0000 mg | ORAL_TABLET | Freq: Two times a day (BID) | ORAL | Status: DC
  Administered 2015-10-18 – 2015-10-20 (×4): 500 mg via ORAL
  Filled 2015-10-17 (×9): qty 1

## 2015-10-17 MED ORDER — RISPERIDONE 0.5 MG PO TABS: 1.0000 mg | ORAL_TABLET | Freq: Every day | ORAL | Status: DC

## 2015-10-17 MED ORDER — DIVALPROEX SODIUM ER 500 MG PO TB24
500.0000 mg | ORAL_TABLET | Freq: Three times a day (TID) | ORAL | Status: DC
  Administered 2015-10-17: 500 mg via ORAL
  Filled 2015-10-17: qty 1

## 2015-10-17 MED ORDER — HYDROXYZINE HCL 50 MG PO TABS
50.0000 mg | ORAL_TABLET | Freq: Four times a day (QID) | ORAL | Status: DC | PRN
  Administered 2015-10-17 – 2015-10-19 (×4): 50 mg via ORAL
  Filled 2015-10-17 (×4): qty 1

## 2015-10-17 MED ORDER — ACETAMINOPHEN 325 MG PO TABS: 650.0000 mg | ORAL_TABLET | ORAL | Status: DC | PRN

## 2015-10-17 MED ORDER — NICOTINE 21 MG/24HR TD PT24: 21.0000 mg | MEDICATED_PATCH | Freq: Every day | TRANSDERMAL | Status: DC

## 2015-10-17 MED ORDER — POTASSIUM CHLORIDE CRYS ER 20 MEQ PO TBCR
20.0000 meq | EXTENDED_RELEASE_TABLET | Freq: Once | ORAL | Status: AC
  Administered 2015-10-17: 20 meq via ORAL
  Filled 2015-10-17: qty 1

## 2015-10-17 MED ORDER — LORAZEPAM 1 MG PO TABS: 1.0000 mg | ORAL_TABLET | Freq: Three times a day (TID) | ORAL | Status: DC | PRN

## 2015-10-17 MED ORDER — ACETAMINOPHEN 325 MG PO TABS: 650.0000 mg | ORAL_TABLET | Freq: Four times a day (QID) | ORAL | Status: DC | PRN

## 2015-10-17 MED ORDER — LISINOPRIL 10 MG PO TABS: 10.0000 mg | ORAL_TABLET | Freq: Every day | ORAL | Status: DC

## 2015-10-17 MED ORDER — TRAZODONE HCL 50 MG PO TABS
50.0000 mg | ORAL_TABLET | Freq: Every evening | ORAL | Status: DC | PRN
  Administered 2015-10-17: 50 mg via ORAL
  Filled 2015-10-17: qty 1

## 2015-10-17 MED ORDER — ALUM & MAG HYDROXIDE-SIMETH 200-200-20 MG/5ML PO SUSP: 30.0000 mL | ORAL | Status: DC | PRN

## 2015-10-17 MED ORDER — METFORMIN HCL 500 MG PO TABS
500.0000 mg | ORAL_TABLET | Freq: Two times a day (BID) | ORAL | Status: DC
  Administered 2015-10-17: 500 mg via ORAL
  Filled 2015-10-17: qty 1

## 2015-10-17 NOTE — ED Notes (Signed)
EMTALA form reviewed by Armen PickupEric Brewer RN

## 2015-10-17 NOTE — ED Provider Notes (Signed)
Accepted by Dr. Jama Flavorsobos. Vitals stable, blood work [employer]. Medically cleared for transfer  Lavera Guiseana Duo Ancil Dewan, MD 10/17/15 2024

## 2015-10-17 NOTE — ED Provider Notes (Signed)
CSN: [id number]     Arrival date & time 10/17/15  1543 History  By signing my name below, I, Emmanuella Mensah, attest that this documentation has been prepared under the direction and in the presence of Fayrene Helper, PA-C. Electronically Signed: Angelene Giovanni, ED Scribe. 10/17/2015. 4:35 PM.   Chief Complaint  Patient presents with  . Suicidal   The history is provided by the patient. No language interpreter was used.   HPI Comments: Richard [name] is a 45 y.o. male with a hx of DM and HTN ho presents to the Emergency Department requesting evaluation for suicidal ideations onset this week. He reports associated lack of sleep.Pt explains that he has been depressed since his father in law passed away from cancer recently and is to be buried tomorrow. He states that he planned to hang himself but called the police and asked for help. He reports a hx of suicidal attempts where he has tried to jump of a bridge and ran in front of a moving vehicle. He denies any alcohol or drug abuse. He denies any recent medication changes. He states that he no longer has a counselor/therapist. He denies any HI or any other physical complaints.    Past Medical History  Diagnosis Date  . Diabetes mellitus   . Hypertension   . Diabetic neuropathy [facility])    Past Surgical History  Procedure Laterality Date  . Mouth surgery    . Tympanostomy tube placement     History reviewed. No pertinent family history. Social History  Substance Use Topics  . Smoking status: Current Every Day Smoker    Types: Cigarettes  . Smokeless tobacco: None  . Alcohol Use: No     Comment: Patient denies     Review of Systems  Constitutional: Negative for fever.  Psychiatric/Behavioral: Positive for suicidal ideas.  All other systems reviewed and are negative.     Allergies  Review of patient's allergies indicates no known allergies.  Home Medications   Prior to Admission medications   Medication Sig Start Date End Date  Taking? Authorizing Provider  divalproex (DEPAKOTE ER) 500 MG 24 hr tablet Take 1 tablet (500 mg total) by mouth 3 (three) times daily before meals. 12/31/14   Nelly Rout, MD  lisinopril (PRINIVIL,ZESTRIL) 10 MG tablet Take 10 mg by mouth daily.    Historical Provider, MD  metFORMIN (GLUCOPHAGE) 500 MG tablet Take 500 mg by mouth 2 (two) times daily with a meal.    Historical Provider, MD  risperiDONE (RISPERDAL) 1 MG tablet Take 1 tablet (1 mg total) by mouth at bedtime. 12/31/14   Nelly Rout, MD   BP 123/84 mmHg  Pulse 74  Temp(Src) 98.5 F (36.9 C) (Oral)  Resp 18  SpO2 96% Physical Exam  Constitutional: He is oriented to person, place, and time. He appears well-developed and well-nourished.  Pt appears calm but does not make any eye contact.   HENT:  Head: Normocephalic and atraumatic.  Cardiovascular: Normal rate, regular rhythm and normal heart sounds.  Exam reveals no gallop and no friction rub.   No murmur heard. Pulmonary/Chest: Effort normal and breath sounds normal. He has no wheezes. He has no rales. He exhibits no tenderness.  Abdominal: Soft. There is no tenderness.  Neurological: He is alert and oriented to person, place, and time.  Skin: Skin is warm and dry.  Psychiatric: His speech is normal. Judgment normal. He is withdrawn. Thought content is not paranoid. Cognition and memory are normal. He exhibits a  depressed mood. He expresses suicidal ideation. He expresses no homicidal ideation.  Nursing note and vitals reviewed.   ED Course  Procedures (including critical care time) DIAGNOSTIC STUDIES: Oxygen Saturation is 96% on RA, normal by my interpretation.    COORDINATION OF CARE: 4:32 PM- Pt advised of plan for treatment and pt agrees. Pt will receive lab work for further evaluation. Pt will speak to behavioral health.    Labs Review Labs Reviewed  COMPREHENSIVE METABOLIC PANEL - Abnormal; Notable for the following:    Potassium 3.3 (*)    Glucose, Bld 166  (*)    BUN 5 (*)    Total Bilirubin 1.5 (*)    All other components within normal limits  ACETAMINOPHEN LEVEL - Abnormal; Notable for the following:    Acetaminophen (Tylenol), Serum <10 (*)    All other components within normal limits  ETHANOL  SALICYLATE LEVEL  CBC  URINE RAPID DRUG SCREEN, HOSP PERFORMED    Imaging Review No results found.   Fayrene HelperBowie Keonte Daubenspeck, PA-C has personally reviewed and evaluated these images and lab results as part of his medical decision-making.   EKG Interpretation None      MDM   Final diagnoses:  Suicidal ideation    BP 123/84 mmHg  Pulse 74  Temp(Src) 98.5 F (36.9 C) (Oral)  Resp 18  SpO2 96%   I personally performed the services described in this documentation, which was scribed in my presence. The recorded information has been reviewed and is accurate.   4:49 PM Pt with prior hx of SI here with SI likely 2/2 to societal and family stress.  Will consult TTS for further management.  Pt does not have any medical condition currently that would preclude further assessment by our psychiatry team.    Fayrene HelperBowie Sawyer Mentzer, PA-C 10/17/15 1650  Lavera Guiseana Duo Liu, MD 10/17/15 2010

## 2015-10-17 NOTE — ED Notes (Signed)
[facility] here [facility].

## 2015-10-17 NOTE — ED Notes (Signed)
Called for a sitter for patient.

## 2015-10-17 NOTE — ED Notes (Signed)
Pt c/o headache, Motrin given for same.

## 2015-10-17 NOTE — ED Notes (Signed)
Pt c/o feeling suicidal this week. Onset after his father in law passed away. He reports he thought of hanging himself. He denies thoughts of harming others. He denies physical complaints. He denies alcohol or substance use. He is alert and breathing easily.

## 2015-10-17 NOTE — BH Assessment (Addendum)
Tele Assessment Note   Richard [name] is an 45 y.o. male  who presents  reporting symptoms of depression and suicidal ideation with a plan to hang himself after his father in law died yesterday. Pt has a history of depression and 3 suicide attempts. Pt reports taking no medication and no OP treatment (he thinks he used to go to [facility]). He states he has been [facility] before, but can't remember where.   Pt acknowledges symptoms including crying spells, social withdrawal, loss of interest in usual pleasures, decreased concentration, fatigue, irritability, decreased sleep, decreased appetite and feelings of hopelessness. PT denies homicidal ideation or history of violence, but states that he did want to kill himself and his wife and kids when they separated a few years ago. He now lives with his sister and [place]. Pt admits to auditory  Hallucinations, but does not respond to them. Pt denies current alcohol or substance abuse, but says he has a history of this, but does not elaborate.  Pt states current stressors include financial, no job, "I feel like the devil is trying to keep me down because I keep trying and can't get anywhere". Pt denies history of abuse and trauma. Pt has fair insight and judgement. Pt denies memory problems.   Pt is casually dressed, alert, oriented x4 with normal speech and normal motor behavior. Eye contact is good.  Pt's mood is depressed and affect is depressed and blunted. Affect is congruent with mood. Thought process is coherent and relevant. There is no indication Pt is currently responding to internal stimuli or experiencing delusional thought content. Pt was cooperative throughout assessment. Pt is currently unable to contract for safety outside the hospital and wants inpatient psychiatric treatment.  Renata Capriceonrad, NP, recommends IP treatment. Per Mardella LaymanLindsey, pt accepted to [id number], Dr. Jama Flavorsobos. Pt can arrive after 7:30 and after he receives Potassium.    Diagnosis: Major  Depressive Disorder, Severe with psychosis  Past Medical History:  Past Medical History  Diagnosis Date  . Diabetes mellitus   . Hypertension   . Diabetic neuropathy [facility](HCC)     Past Surgical History  Procedure Laterality Date  . Mouth surgery    . Tympanostomy tube placement      Family History: History reviewed. No pertinent family history.  Social History:  reports that he has been smoking Cigarettes.  He does not have any smokeless tobacco history on file. He reports that he does not drink alcohol or use illicit drugs.  Additional Social History:  Alcohol / Drug Use Pain Medications: denies Prescriptions: denies Over the Counter: denies History of alcohol / drug use?:  (denies) Longest period of sobriety (when/how long): UTA Negative Consequences of Use:  (denies) Withdrawal Symptoms:  (denies)  CIWA: CIWA-Ar BP: 123/84 mmHg Pulse Rate: 74 COWS:    PATIENT STRENGTHS: (choose at least two) Capable of independent living Communication skills Motivation for treatment/growth Supportive family/friends  Allergies: No Known Allergies  Home Medications:  (Not in a hospital admission)  OB/GYN Status:  No LMP for male patient.  General Assessment Data [facility] Is this a Tele or Face-to-Face Assessment?: Tele Assessment Is this an Initial Assessment or a Re-assessment for this encounter?: Initial Assessment Marital status: Separated Living Arrangements:  (SIL) Can pt return to current living arrangement?: Yes Admission Status: Voluntary Is patient capable of signing voluntary admission?: Yes Referral Source: Self/Family/Friend Insurance type: SP     Crisis Care Plan Living Arrangements:  (SIL) [facility] Name  of Therapist: Engineer, maintenance Status Is patient currently in school?: No Highest grade of school patient has completed: 9th  Risk to self with the past 6 months Suicidal Ideation: Yes-Currently Present Has  patient been a risk to self within the past 6 months prior to admission? : No Suicidal Intent: Yes-Currently Present Has patient had any suicidal intent within the past 6 months prior to admission? : No Is patient at risk for suicide?: Yes Suicidal Plan?: Yes-Currently Present Has patient had any suicidal plan within the past 6 months prior to admission? : Yes Specify Current Suicidal Plan: hanging Access to Means: Yes Specify Access to Suicidal Means: household objects What has been your use of drugs/alcohol within the last 12 months?:  (denies) Previous Attempts/Gestures: Yes How many times?: 3 Other Self Harm Risks:  (no) Triggers for Past Attempts: Spouse contact Intentional Self Injurious Behavior: None Recent stressful life event(s): Loss (Comment), Financial Problems (father in law) Depression: Yes Depression Symptoms: Insomnia, Isolating, Loss of interest in usual pleasures, Feeling angry/irritable, Feeling worthless/self pity, Guilt, Fatigue, Tearfulness, Despondent Substance abuse history and/or treatment for substance abuse?: No Suicide prevention information given to non-admitted patients: Not applicable  Risk to Others within the past 6 months Homicidal Ideation: No Does patient have any lifetime risk of violence toward others beyond the six months prior to admission? : No Thoughts of Harm to Others: No Current Homicidal Intent: No Current Homicidal Plan: No Access to Homicidal Means: No History of harm to others?: Yes Assessment of Violence: In distant past Violent Behavior Description:  (wanted to kill wife and himself when they separated) Does patient have access to weapons?: No Criminal Charges Pending?: No Does patient have a court date: No Is patient on probation?: No  Psychosis Hallucinations: Auditory Delusions: None noted  Mental Status Report Appearance/Hygiene: In scrubs Eye Contact: Fair Motor Activity: Unremarkable Speech: Logical/coherent Level  of Consciousness: Alert Mood: Depressed, Sad Affect: Sad, Depressed Anxiety Level: None Thought Processes: Coherent, Relevant Judgement: Partial Orientation: Person, Place, Time, Situation, Appropriate for developmental age Obsessive Compulsive Thoughts/Behaviors: None  Cognitive Functioning Concentration: Decreased Memory: Recent Intact, Remote Intact IQ: Average Insight: Fair Impulse Control: Fair Appetite: Good Weight Loss: 0 Weight Gain: 0 Sleep: Decreased Total Hours of Sleep:  (4-6) Vegetative Symptoms: None  ADLScreening [facility] Assessment Services) Patient's cognitive ability adequate to safely complete daily activities?: Yes Patient able to express need for assistance with ADLs?: Yes Independently performs ADLs?: Yes (appropriate for developmental age)  Prior Inpatient Therapy Prior Inpatient Therapy: Yes Prior Therapy Dates: unk Prior Therapy Facilty/Provider(s): [facility], can't rmember Reason for Treatment: depression, SI  Prior Outpatient Therapy Prior Outpatient Therapy: Yes Prior Therapy Dates: unk Prior Therapy Facilty/Provider(s): [facility]? Reason for Treatment: depressed Does patient have an ACCT team?: No Does patient have Intensive In-House Services?  : No Does patient have [facility] services? : No Does patient have [facility] services?: No  ADL Screening (condition at time of admission) Patient's cognitive ability adequate to safely complete daily activities?: Yes Is the patient deaf or have difficulty hearing?: No Does the patient have difficulty seeing, even when wearing glasses/contacts?: No Does the patient have difficulty concentrating, remembering, or making decisions?: No Patient able to express need for assistance with ADLs?: Yes Does the patient have difficulty dressing or bathing?: No Independently performs ADLs?: Yes (appropriate for developmental age) Does the patient have difficulty walking or climbing stairs?: No Weakness of Legs: None Weakness of  Arms/Hands: None  Home Assistive Devices/Equipment Home Assistive Devices/Equipment: None  Abuse/Neglect Assessment (Assessment to be complete while patient is alone) Physical Abuse: Denies Verbal Abuse: Denies Sexual Abuse: Denies Exploitation of patient/patient's resources: Denies Self-Neglect: Denies Values / Beliefs Cultural Requests During Hospitalization: None Spiritual Requests During Hospitalization: None   Advance Directives (For Healthcare) Does patient have an advance directive?: No Would patient like information on creating an advanced directive?: No - patient declined information    Additional Information 1:1 In Past 12 Months?: No CIRT Risk: No Elopement Risk: No Does patient have medical clearance?: No     Disposition:  Disposition Initial Assessment Completed for this Encounter: Yes Disposition of Patient: Inpatient treatment program  [facility] 10/17/2015 5:29 PM

## 2015-10-17 NOTE — Tx Team (Signed)
Initial Interdisciplinary Treatment Plan   PATIENT STRESSORS: Financial difficulties Loss of father in law   PATIENT STRENGTHS: Average or above average intelligence Capable of independent living Communication skills General fund of knowledge   PROBLEM LIST: Problem List/Patient Goals Date to be addressed Date deferred Reason deferred Estimated date of resolution  depression 10/17/15     SI 10/17/15           "my anger"  10/17/15     "my depression"  10/17/15                              DISCHARGE CRITERIA:  Improved stabilization in mood, thinking, and/or behavior Need for constant or close observation no longer present  PRELIMINARY DISCHARGE PLAN: Outpatient therapy Return to previous living arrangement  PATIENT/FAMIILY INVOLVEMENT: This treatment plan has been presented to and reviewed with the patient, Richard [name].  The patient and family have been given the opportunity to ask questions and make suggestions.  Arrie AranChurch, Shermaine Rivet J 10/17/2015, 10:56 PM

## 2015-10-17 NOTE — Progress Notes (Signed)
Pt is a 45 year old male [facility] for SI with a plan to hang himself.  He reports he is here because "suicidal thoughts, my father-in-law passed away and he's going to be buried tomorrow."  Pt reports SI during admission assessment.  He verbally contracts for safety.  Pt denies HI, denies hallucinations, denies pain.  Pt denies alcohol and drug use.  He reports medical history of Diabetes, hypertension, and diabetic neuropathy.  Pt reports previous suicide attempt "about 2 years ago" when he "ran [place] front of traffic [place]  Pt denies history of abuse.  He reports he would like to work on "my anger, my depression."    Introduced self to pt.  Admission process and paperwork completed with pt.  Actively listened to pt and offered support and encouragement.  Pt oriented to unit.  Non-invasive body assessment completed.  Pt has tattoo on his upper right arm, scar on: right shoulder, mid abdomen, right knee, left lower leg.  Belongings searched for contraband.  Items not allowed on unit are in locker 32.  Pt brought glasses and earrings to unit.  PRN medication administered for anxiety and sleep.    Pt was cooperative with admission process.  He verbally contracts for safety.  Will continue to monitor and assess.

## 2015-10-18 ENCOUNTER — Encounter (HOSPITAL_COMMUNITY): Payer: Self-pay | Admitting: Psychiatry

## 2015-10-18 DIAGNOSIS — F332 Major depressive disorder, recurrent severe without psychotic features: Principal | ICD-10-CM

## 2015-10-18 MED ORDER — TRAZODONE HCL 50 MG PO TABS
50.0000 mg | ORAL_TABLET | Freq: Every evening | ORAL | Status: DC | PRN
  Administered 2015-10-18 – 2015-10-19 (×2): 50 mg via ORAL
  Filled 2015-10-18 (×3): qty 1

## 2015-10-18 MED ORDER — POTASSIUM CHLORIDE CRYS ER 10 MEQ PO TBCR
10.0000 meq | EXTENDED_RELEASE_TABLET | Freq: Two times a day (BID) | ORAL | Status: AC
  Administered 2015-10-18 – 2015-10-19 (×3): 10 meq via ORAL
  Filled 2015-10-18 (×4): qty 1

## 2015-10-18 MED ORDER — FLUOXETINE HCL 20 MG PO CAPS
20.0000 mg | ORAL_CAPSULE | Freq: Every day | ORAL | Status: DC
  Administered 2015-10-18 – 2015-10-20 (×3): 20 mg via ORAL
  Filled 2015-10-18 (×5): qty 1

## 2015-10-18 NOTE — Tx Team (Signed)
Interdisciplinary Treatment Plan Update (Adult) Date: 10/18/2015    Time Reviewed: 9:30 AM  Progress in Treatment: Attending groups: Continuing to assess, patient new to milieu Participating in groups: Continuing to assess, patient new to milieu Taking medication as prescribed: Yes Tolerating medication: Yes Family/Significant other contact made: No, CSW assessing for appropriate contacts Patient understands diagnosis: Yes Discussing patient identified problems/goals with staff: Yes Medical problems stabilized or resolved: Yes Denies suicidal/homicidal ideation: Treatment team continuing to assess, expressing passive SI at this time Issues/concerns per patient self-inventory: Yes Other:  New problem(s) identified: N/A  Discharge Plan or Barriers: CSW continuing to assess, patient new to milieu.  Reason for Continuation of Hospitalization:  Depression Anxiety Medication Stabilization   Comments: N/A  Estimated length of stay: 3-5 days    Patient is a 45 year old male who presented to the hospital with  Depression and SI with plan to hang himself after his father in law died 2 days ago. Pt reports primary trigger(s) for admission was family stressors, death of father in law, unemployment and financial concerns. Patient will benefit from crisis stabilization, medication evaluation, group therapy and psycho education in addition to case management for discharge planning. At discharge, it is recommended that Pt remain compliant with established discharge plan and continued treatment.   Review of initial/current patient goals per problem list:  1. Goal(s): Patient will participate in aftercare plan   Met: No   Target date: 3-5 days post admission date   As evidenced by: Patient will participate within aftercare plan AEB aftercare provider and housing plan at discharge being identified.  5/12: Goal not met: CSW assessing for appropriate referrals for pt and will have follow  up secured prior to d/c.    2. Goal (s): Patient will exhibit decreased depressive symptoms and suicidal ideations.   Met: No   Target date: 3-5 days post admission date   As evidenced by: Patient will utilize self rating of depression at 3 or below and demonstrate decreased signs of depression or be deemed stable for discharge by MD.  5/12: Goal not met: Pt presents with flat affect and depressed mood.  Pt admitted with depression rating of 10.  Pt to show decreased sign of depression and a rating of 3 or less before d/c.     Attendees: Patient:    Family:    Physician: Dr. Parke Poisson 10/18/2015 9:30 AM  Nursing: Loletta Specter, Vira Browns, RN 10/18/2015 9:30 AM  Clinical Social Worker: Tilden Fossa, LCSW 10/18/2015 9:30 AM  Other:  10/18/2015 9:30 AM  Other:  10/18/2015 9:30 AM  Other:  10/18/2015 9:30 AM  Other: Agustina Caroli, NP 10/18/2015 9:30 AM  Other:           Scribe for Treatment Team:  Tilden Fossa, Harold

## 2015-10-18 NOTE — Progress Notes (Signed)
Patient has been calmer since meeting with MD. Agreeable to trial of prozac which was given without incident. Patient remains guarded with avertive eye contact but is less agitated. Asked patient about his home routine to monitoring blood glucose and he indicates he has not checked it "in years." Will continue to monitor closely.

## 2015-10-18 NOTE — [facility] Group Notes (Signed)
[facility] LCSW Group Therapy Note  Date/Time: 10/18/2015 1:30PM  Type of Therapy and Topic: Group Therapy: Holding on to Grudges  Participation Level: Minimal  Description of Group:  In this group patients will be asked to explore and define a grudge. Patients will be guided to discuss their thoughts, feelings, and behaviors as to why one holds on to grudges and reasons why people have grudges. Patients will process the impact grudges have on daily life and identify thoughts and feelings related to holding on to grudges. Facilitator will challenge patients to identify ways of letting go of grudges and the benefits once released. Patients will be confronted to address why one struggles letting go of grudges. Lastly, patients will identify feelings and thoughts related to what life would look like without grudges. This group will be process-oriented, with patients participating in exploration of their own experiences as well as giving and receiving support and challenge from other group members.  Therapeutic Goals: 1. Patient will identify specific grudges related to their personal life. 2. Patient will identify feelings, thoughts, and beliefs around grudges. 3. Patient will identify how one releases grudges appropriately. 4. Patient will identify situations where they could have let go of the grudge, but instead chose to hold on.  Summary of Patient Progress  Patient participated minimally in group discussion and did not engage in therapeutic activity.    Therapeutic Modalities:  Cognitive Behavioral Therapy Solution Focused Therapy Motivational Interviewing Brief Therapy    Samuella BruinKristin Brooklen Runquist, LCSW Clinical Social Worker [facility] [phone]

## 2015-10-18 NOTE — [facility] Suicide Risk Assessment (Signed)
[facility]   Nursing information obtained from:  Patient Demographic factors:  Male Current Mental Status:  Suicidal ideation indicated by patient Loss Factors:  Loss of significant relationship, Financial problems / change in socioeconomic status Historical Factors:  Prior suicide attempts, Family history of mental illness or substance abuse Risk Reduction Factors:  Employed, Living with another person, especially a relative  Total Time spent with patient: 45 minutes Principal Problem:  MDD  Diagnosis:   Patient Active Problem List   Diagnosis Date Noted  . Major depression (HCC) [F32.9] 10/17/2015  . Major depressive disorder, recurrent, severe without psychotic features (HCC) [F33.2]   . Opiate dependence (HCC) [F11.20] 07/10/2014  . Severe major depression,recurrent without psychotic features [F32.2] 01/22/2014  . Suicidal ideations [R45.851] 10/27/2013     Continued Clinical Symptoms:  Alcohol Use Disorder Identification Test Final Score (AUDIT): 0 The "Alcohol Use Disorders Identification Test", Guidelines for Use in Primary Care, Second Edition.  World Science writerHealth Organization [facility](WHO). Score between 0-7:  no or low risk or alcohol related problems. Score between 8-15:  moderate risk of alcohol related problems. Score between 16-19:  high risk of alcohol related problems. Score 20 or above:  warrants further diagnostic evaluation for alcohol dependence and treatment.   CLINICAL FACTORS:  45 year old male, presents with depression, neuro-vegetative symptoms of depression and suicidal ideations . Reports depression exacerbated after recent death of his father in law, whom he was very close to    Psychiatric Specialty Exam: ROS  Blood pressure 105/61, pulse 68, temperature 97.8 F (36.6 C), temperature source Oral, resp. rate 16, height 5\' 8"  (1.727 m), weight 221 lb (100.245 kg).Body mass index is 33.61 kg/(m^2).   see admit note MSE   COGNITIVE  FEATURES THAT CONTRIBUTE TO RISK:  Loss of executive function    SUICIDE RISK:   Moderate:  Frequent suicidal ideation with limited intensity, and duration, some specificity in terms of plans, no associated intent, good self-control, limited dysphoria/symptomatology, some risk factors present, and identifiable protective factors, including available and accessible social support.  PLAN OF CARE: Patient will be admitted to inpatient psychiatric unit for stabilization and safety. Will provide and encourage milieu participation. Provide medication management and maked adjustments as needed.  Will follow daily.    I certify that inpatient services furnished can reasonably be expected to improve the patient's condition.   Nehemiah MassedOBOS, Christiann Hagerty, MD 10/18/2015, 5:04 PM

## 2015-10-18 NOTE — H&P (Addendum)
Psychiatric Admission Assessment Adult  Patient Identification: Richard [name] MRN:  [id number] Date of Evaluation:  10/18/2015 Chief Complaint:  " depression" Principal Diagnosis:  MDD  Diagnosis:   Patient Active Problem List   Diagnosis Date Noted  . Major depression ([place]) [F32.9] 10/17/2015  . Major depressive disorder, recurrent, severe without psychotic features ([place]) [F33.2]   . Opiate dependence ([place]) [F11.20] 07/10/2014  . Severe major depression,recurrent without psychotic features [F32.2] 01/22/2014  . Suicidal ideations [R45.851] 10/27/2013   History of Present Illness:: 45 year old man. Father in law passed away one week ago. States he was very close to him . States he has a long history of depression, but has felt more depressed since his death. Patient states " he took me off the streets and was good to me, the closest thing to a dad I have had" .  Patient presented to the ED reporting worsening depression and suicidal ideations of hanging self . States , " I was depressed before that, but I guess him dying just pushed me over the edge ".     Associated Signs/Symptoms: Depression Symptoms:  depressed mood, anhedonia, insomnia, suicidal thoughts with specific plan, loss of energy/fatigue, decreased appetite, has lost about 8 lbs over the last two to three weeks  (Hypo) Manic Symptoms:  none  Anxiety Symptoms:  States he worries excessively, even about minor issues, denies panic attacks , describes some agoraphobia  Psychotic Symptoms: denies, except for occasionally hearing name being called  PTSD Symptoms: Denies  Total Time spent with patient: 45 minutes  Past Psychiatric History:  Has had several psychiatric admissions in the past, last admission was 2 years ago, for depression, usually related to stressors, such as prior separation .  One prior admission to our unit in 2015 due to depression,anger, HI towards a man he stated was in a relationship with his wife  .  Denies history of psychosis, other than hearing his name being called " occasionally". One suicide attempt by walking into traffic . Denies any clear history of mania or hypomania, does have history of anger, explosiveness , usually of short duration   Is the patient at risk to self? Yes.    Has the patient been a risk to self in the past 6 months? Yes.    Has the patient been a risk to self within the distant past? Yes.    Is the patient a risk to others? No.  Has the patient been a risk to others in the past 6 months? No.  Has the patient been a risk to others within the distant past? Yes.     Prior Inpatient Therapy:  as above  Prior Outpatient Therapy:  none at this time  Alcohol Screening: 1. How often do you have a drink containing alcohol?: Never 2. How many drinks containing alcohol do you have on a typical day when you are drinking?: 1 or 2 (doesn't drink per pt) 3. How often do you have six or more drinks on one occasion?: Never Preliminary Score: 0 4. How often during the last year have you found that you were not able to stop drinking once you had started?: Never 5. How often during the last year have you failed to do what was normally expected from you becasue of drinking?: Never 6. How often during the last year have you needed a first drink in the morning to get yourself going after a heavy drinking session?: Never 7. How often during the last year  have you had a feeling of guilt of remorse after drinking?: Never 8. How often during the last year have you been unable to remember what happened the night before because you had been drinking?: Never 9. Have you or someone else been injured as a result of your drinking?: No 10. Has a relative or friend or a doctor or another health worker been concerned about your drinking or suggested you cut down?: No Alcohol Use Disorder Identification Test Final Score (AUDIT): 0 Brief Intervention: AUDIT score less than 7 or  less-screening does not suggest unhealthy drinking-brief intervention not indicated Substance Abuse History in the last 12 months:   Denies alcohol abuse, history of abusing opiates in the past, but had stopped  " a long time ago". States that he did recently use oxycodone x 1-2 days , but that it was isolated episode. Denies other drug abuse, denies IVDA Consequences of Substance Abuse: States substance abuse affected relationship and contributed to their separation  Previous Psychotropic Medications:  Has been on Depakote and Celexa in the past , but has not been on any psychiatric medication x close to two years  Psychological Evaluations:  No  Past Medical History:  Past Medical History  Diagnosis Date  . Diabetes mellitus   . Hypertension   . Diabetic neuropathy [facility])     Past Surgical History  Procedure Laterality Date  . Mouth surgery    . Tympanostomy tube placement     Family History:  Mother passed away in 13 from complications of DM, father died in 2010/11/17, but did not have relationship with patient . States " I only saw him twice in my life" . Three siblings,Has a twin brother Family Psychiatric  History:  States an uncle committed suicide, and reports that two siblings have history of anger, explosiveness. Denies substance abuse in family.  Tobacco Screening: smokes 1 PPD  Social History: Separated, but states that he now has a very good /cordial relationship with his wife, has three children, lives with sister in Social worker, currently unemployed, no current source of income , denies legal issues  History  Alcohol Use No    Comment: Patient denies      History  Drug Use No    Comment: Patient denies current use     Additional Social History:      Pain Medications: denies Prescriptions: denies Over the Counter: denies History of alcohol / drug use?: No history of alcohol / drug abuse  Allergies:  No Known Allergies Lab Results:  Results for orders placed or performed during  the hospital encounter of 10/17/15 (from the past 48 hour(s))  Comprehensive metabolic panel     Status: Abnormal   Collection Time: 10/17/15  3:53 PM  Result Value Ref Range   Sodium 142 135 - 145 mmol/L   Potassium 3.3 (L) 3.5 - 5.1 mmol/L   Chloride 105 101 - 111 mmol/L   CO2 25 22 - 32 mmol/L   Glucose, Bld 166 (H) 65 - 99 mg/dL   BUN 5 (L) 6 - 20 mg/dL   Creatinine, Ser 5.31 0.61 - 1.24 mg/dL   Calcium 9.5 8.9 - 03.3 mg/dL   Total Protein 7.4 6.5 - 8.1 g/dL   Albumin 4.0 3.5 - 5.0 g/dL   AST 22 15 - 41 U/L   ALT 26 17 - 63 U/L   Alkaline Phosphatase 109 38 - 126 U/L   Total Bilirubin 1.5 (H) 0.3 - 1.2 mg/dL   GFR calc non  Af Amer >60 >60 mL/min   GFR calc Af Amer >60 >60 mL/min    Comment: (NOTE) The eGFR has been calculated using the CKD EPI equation. This calculation has not been validated in all clinical situations. eGFR's persistently <60 mL/min signify possible Chronic Kidney Disease.    Anion gap 12 5 - 15  Ethanol     Status: None   Collection Time: 10/17/15  3:53 PM  Result Value Ref Range   Alcohol, Ethyl (B) <5 <5 mg/dL    Comment:        LOWEST DETECTABLE LIMIT FOR SERUM ALCOHOL IS 5 mg/dL FOR MEDICAL PURPOSES ONLY   Salicylate level     Status: None   Collection Time: 10/17/15  3:53 PM  Result Value Ref Range   Salicylate Lvl <9.5 2.8 - 30.0 mg/dL  Acetaminophen level     Status: Abnormal   Collection Time: 10/17/15  3:53 PM  Result Value Ref Range   Acetaminophen (Tylenol), Serum <10 (L) 10 - 30 ug/mL    Comment:        THERAPEUTIC CONCENTRATIONS VARY SIGNIFICANTLY. A RANGE OF 10-30 ug/mL MAY BE AN EFFECTIVE CONCENTRATION FOR MANY PATIENTS. HOWEVER, SOME ARE BEST TREATED AT CONCENTRATIONS OUTSIDE THIS RANGE. ACETAMINOPHEN CONCENTRATIONS >150 ug/mL AT 4 HOURS AFTER INGESTION AND >50 ug/mL AT 12 HOURS AFTER INGESTION ARE OFTEN ASSOCIATED WITH TOXIC REACTIONS.   cbc     Status: None   Collection Time: 10/17/15  3:53 PM  Result Value Ref  Range   WBC 5.6 4.0 - 10.5 K/uL   RBC 5.25 4.22 - 5.81 MIL/uL   Hemoglobin 15.1 13.0 - 17.0 g/dL   HCT 43.5 39.0 - 52.0 %   MCV 82.9 78.0 - 100.0 fL   MCH 28.8 26.0 - 34.0 pg   MCHC 34.7 30.0 - 36.0 g/dL   RDW 13.1 11.5 - 15.5 %   Platelets 213 150 - 400 K/uL  Rapid urine drug screen (hospital performed)     Status: None   Collection Time: 10/17/15  4:40 PM  Result Value Ref Range   Opiates NONE DETECTED NONE DETECTED   Cocaine NONE DETECTED NONE DETECTED   Benzodiazepines NONE DETECTED NONE DETECTED   Amphetamines NONE DETECTED NONE DETECTED   Tetrahydrocannabinol NONE DETECTED NONE DETECTED   Barbiturates NONE DETECTED NONE DETECTED    Comment:        DRUG SCREEN FOR MEDICAL PURPOSES ONLY.  IF CONFIRMATION IS NEEDED FOR ANY PURPOSE, NOTIFY LAB WITHIN 5 DAYS.        LOWEST DETECTABLE LIMITS FOR URINE DRUG SCREEN Drug Class       Cutoff (ng/mL) Amphetamine      1000 Barbiturate      200 Benzodiazepine   638 Tricyclics       756 Opiates          300 Cocaine          300 THC              50     Blood Alcohol level:  Lab Results  Component Value Date   ETH <5 10/17/2015   ETH <5 43/32/9518    Metabolic Disorder Labs:  Lab Results  Component Value Date   HGBA1C 6.3* 10/01/2014   MPG 134 10/01/2014   MPG 143* 11/11/2013   No results found for: PROLACTIN No results found for: CHOL, TRIG, HDL, CHOLHDL, VLDL, LDLCALC  Current Medications: Current Facility-Administered Medications  Medication Dose Route Frequency Provider Last Rate Last  Dose  . acetaminophen (TYLENOL) tablet 650 mg  650 mg Oral Q6H PRN Dara Hoyer, PA-C      . alum & mag hydroxide-simeth (MAALOX/MYLANTA) 200-200-20 MG/5ML suspension 30 mL  30 mL Oral Q4H PRN Dara Hoyer, PA-C      . hydrOXYzine (ATARAX/VISTARIL) tablet 50 mg  50 mg Oral Q6H PRN Dara Hoyer, PA-C   50 mg at 10/17/15 2251  . metFORMIN (GLUCOPHAGE) tablet 500 mg  500 mg Oral BID WC Dara Hoyer, PA-C   500 mg at  10/18/15 0800  . traZODone (DESYREL) tablet 50 mg  50 mg Oral QHS PRN,MR X 1 Dara Hoyer, PA-C   50 mg at 10/17/15 2251   PTA Medications: Prescriptions prior to admission  Medication Sig Dispense Refill Last Dose  . divalproex (DEPAKOTE ER) 500 MG 24 hr tablet Take 1 tablet (500 mg total) by mouth 3 (three) times daily before meals. 90 tablet 0 10/17/2015 at Unknown time  . lisinopril (PRINIVIL,ZESTRIL) 10 MG tablet Take 10 mg by mouth daily.   10/17/2015 at Unknown time  . metFORMIN (GLUCOPHAGE) 500 MG tablet Take 500 mg by mouth 2 (two) times daily with a meal.   10/17/2015 at Unknown time  . risperiDONE (RISPERDAL) 1 MG tablet Take 1 tablet (1 mg total) by mouth at bedtime. (Patient not taking: Reported on 10/17/2015) 30 tablet 0 Not Taking at Unknown time    Musculoskeletal: Strength & Muscle Tone: within normal limits Gait & Station: normal Patient leans: N/A  Psychiatric Specialty Exam: Physical Exam  Review of Systems  Constitutional: Negative.   HENT: Negative.   Eyes: Negative.   Respiratory: Negative.   Cardiovascular: Negative.   Gastrointestinal: Negative.   Genitourinary: Negative.   Musculoskeletal: Negative.   Skin: Negative.   Neurological: Negative for seizures.  Endo/Heme/Allergies: Negative.   Psychiatric/Behavioral: Positive for depression and suicidal ideas.  All other systems reviewed and are negative.   Blood pressure 105/61, pulse 68, temperature 97.8 F (36.6 C), temperature source Oral, resp. rate 16, height '5\' 8"'$  (1.727 m), weight 221 lb (100.245 kg).Body mass index is 33.61 kg/(m^2).  General Appearance: Well Groomed  Engineer, water::  Good  Speech:  Normal Rate  Volume:  Normal  Mood:  Depressed  Affect:  constricted, vaguely anxious   Thought Process:  Linear  Orientation:  Full (Time, Place, and Person)  Thought Content:  denies hallucinations, no delusions, not internally preoccupied   Suicidal Thoughts:  No denies any current suicidal  ideations or self injurious ideations and contracts for safety on the unit   Homicidal Thoughts:  No denies any homicidal ideations  Memory:  recent and remote grossly intact   Judgement:  Fair  Insight:  Fair  Psychomotor Activity:  Normal  Concentration:  Good  Recall:  Good  Fund of Knowledge:Good  Language: Good  Akathisia:  Negative  Handed:  Right  AIMS (if indicated):     Assets:  Desire for Improvement Resilience  ADL's:  Intact  Cognition: WNL  Sleep:  Number of Hours: 6.5     Treatment Plan Summary: Daily contact with patient to assess and evaluate symptoms and progress in treatment, Medication management, Plan inpatient treatment  and medications as below   Observation Level/Precautions:  15 minute checks  Laboratory:  As needed   Psychotherapy:  Milieu, support   Medications:  We discussed options, patient does want to be on an antidepressant,agrees to [facility] trial- side effects discussed. We also reviewed medication  options for history of explosiveness, such as Depakote, but at this time reports this issue has tended to improve, and major issue is depression.  KDUR supplementation to address hypokalemia  Consultations:  As needed   Discharge Concerns:  -  Estimated LOS: 4 days - of note, patient states he is hoping for discharge soon   Other:     I certify that inpatient services furnished can reasonably be expected to improve the patient's condition.    Neita Garnet, MD 5/12/20172:12 PM

## 2015-10-18 NOTE — Progress Notes (Signed)
Patient resting in bed this AM. Awakened for meds, assessment however minimal information given. Refused to get up for meds. Patient up after lunch, will not give this writer eye contact. Did complete self inventory and rates his depression at a 9/10, hopelessness and anxiety both at a 10/10. Goal is "going home." States, "I just gotta get out of here. I feel like I'm in prison. I don't want you all to call the police like last time. But it's going to be bad if the doctor doesn't let me go." Offered 72 hour RFD which patient signed. Attempted to process with patient that given the severity of his symptoms, it would be best and necessary for him to stay. Offered emotional support however patient minimally receptive. MD made aware along with DD, charge RN. Patient currently in group. He does verbally contract for safety. Denies AVH, thoughts to hurt others. Will continue to monitor closely.

## 2015-10-18 NOTE — Progress Notes (Signed)
Recreation Therapy Notes  Date: 05.12.2017 Time: 9:30am Location: [address] Dayroom   Group Topic: Stress Management  Goal Area(s) Addresses:  Patient will actively participate in stress management techniques presented during session.   Behavioral Response: Did not attend.   Kevontay Burks L Kourtland Coopman, LRT/CTRS        Sione Baumgarten L 10/18/2015 4:06 PM 

## 2015-10-18 NOTE — Progress Notes (Signed)
Adult Psychoeducational Group Note  Date:  10/18/2015 Time:  9:25 PM  Group Topic/Focus:  Wrap-Up Group:   The focus of this group is to help patients review their daily goal of treatment and discuss progress on daily workbooks.  Participation Level:  Active  Participation Quality:  Appropriate and Attentive  Affect:  Appropriate  Cognitive:  Appropriate  Insight: Appropriate and Good  Engagement in Group:  Engaged  Modes of Intervention:  Discussion  Additional Comments:  Pt rated her day a 8 out of 10. Pt goal for tomorrow is to go home.   Merlinda FrederickKeshia S Minsa Weddington 10/18/2015, 9:25 PM

## 2015-10-18 NOTE — [facility] Group Notes (Signed)
[facility] LCSW Aftercare Discharge Planning Group Note  10/18/2015  8:45 AM  Participation Quality: Did Not Attend. Patient invited to participate but declined.  Joby Richart, MSW, LCSW Clinical Social Worker [facility] [phone]   

## 2015-10-19 MED ORDER — TRAZODONE HCL 50 MG PO TABS
50.0000 mg | ORAL_TABLET | Freq: Once | ORAL | Status: AC
  Administered 2015-10-19: 50 mg via ORAL

## 2015-10-19 NOTE — [facility] Group Notes (Signed)
[facility] Group Notes: (Clinical Social Work)   10/19/2015      Type of Therapy:  Group Therapy   Participation Level:  Did Not Attend despite MHT prompting   Ambrose MantleMareida Grossman-Orr, LCSW 10/19/2015, 11:02 AM

## 2015-10-19 NOTE — [facility] Counselor (Signed)
Adult Comprehensive Assessment  Patient ID: Richard [name], male   DOB: 1971-02-16, 45 y.o.   MRN: [id number]  Information Source: Information source: Patient  Current Stressors:  Educational / Learning stressors: Denies stressors Employment / Job issues: Scientist, physiological find a job - since 2015 Family Relationships: Sometimes stressful Surveyor, quantity / Lack of resources (include bankruptcy): No income Housing / Lack of housing: Denies stressors Physical health (include injuries & life threatening diseases): Denies stressors Social relationships: Refuses to answer Substance abuse: Refuses to answer Bereavement / Loss: Father-in-law just died  Living/Environment/Situation:  Living Arrangements: Other relatives (sister-in-law, brother-in-law, their kids) Living conditions (as described by patient or guardian): Safe neighborhood, does not have his own room How long has patient lived in current situation?: "a little while" What is atmosphere in current home: Supportive  Family History:  Marital status:  (Refuses to answer) Does patient have children?: Yes How many children?: 3 How is patient's relationship with their children?: Good  Childhood History:  By whom was/is the patient raised?: Both parents Additional childhood history information: Okay Description of patient's relationship with caregiver when they were a child: Okay Patient's description of current relationship with people who raised him/her: Both deceased How were you disciplined when you got in trouble as a child/adolescent?: Refuses to answer Does patient have siblings?: Yes Number of Siblings: 3 Description of patient's current relationship with siblings: Good Did patient suffer any verbal/emotional/physical/sexual abuse as a child?: Yes Has patient ever been sexually abused/assaulted/raped as an adolescent or adult?: No Witnessed domestic violence?: Yes Has patient been effected by domestic violence as an adult?: No Description of  domestic violence: Refuses to answer  Education:  Highest grade of school patient has completed: 9th Currently a student?: No  Employment/Work Situation:   Employment situation: Unemployed Patient's job has been impacted by current illness: No What is the longest time patient has a held a job?: 10 years Where was the patient employed at that time?: [employer] Has patient ever been in the [employer]?: No Has patient ever served in combat?: No Are There Guns or Other Weapons in Your Home?: No  Financial Resources:   Financial resources: No income Does patient have a Lawyer or guardian?: No  Alcohol/Substance Abuse:   What has been your use of drugs/alcohol within the last 12 months?: None  Social Support System:   [employer] Support System: Good Type of faith/religion: Talks to God   Leisure/Recreation:   Leisure and Hobbies: Working out and basketball  Strengths/Needs:   In what areas does patient struggle / problems for patient: Grief, joblessness, life  Discharge Plan:   Does patient have access to transportation?: No Plan for no access to transportation at discharge: Needs a bus pass Will patient be returning to same living situation after discharge?: Yes (will go first to a friend's home for a couple of days, then return to sister-in-law's house) Currently receiving community mental health services: No If no, would patient like referral for services when discharged?: Yes (What county[place] Biomedical scientist to [place] - could not afford the medicine - )  Summary/Recommendations:   Summary and Recommendations (to be completed by the evaluator): Patient is a 44yo male admitted to the hospital with worsening depression and suicidal ideation with a plan to hang himself and reports primary trigger for admission was his father-in-law's death last week.  Patient will benefit from crisis stabilization, medication evaluation, group therapy and psychoeducation, in addition to  case management for discharge planning. At discharge it is recommended that  Patient adhere to the established discharge plan and continue in treatment.  Sarina SerGrossman-Orr, Colson Barco Jo. 10/19/2015

## 2015-10-19 NOTE — Progress Notes (Signed)
Patient has been up in the dayroom laughing and talking with select peers. He attended group this evening and is hopeful to discharge on Sunday. Patient currently denies having pain, -si/hi/a/v hall. Support and encouragement offered, safety maintained on unit, will continue to monitor.

## 2015-10-19 NOTE — Progress Notes (Signed)
Pt has been observed most of the evening sitting to himself in the dayroom with little to no interaction with his peers.  He reports to this Clinical research associatewriter that he is feeling very anxious and wants to be discharge tomorrow.  He denies having any suicidal thoughts this evening.  He denies HI/AVH.  He admits that he came voluntarily, but now says that this is not what he needs.  He has been cooperative with staff tonight, and his behavior has been appropriate.  He received Trazodone 50 mg along with Vistaril 50 mg at bedtime per request.  Pt was encouraged to speak with the MD in the morning about his concerns.  Support and encouragement offered.  Discharge plans are in process.  Safety maintained with q15 minute checks.

## 2015-10-19 NOTE — Progress Notes (Signed)
[facility] MD Progress Note  10/19/2015 11:57 AM Richard [name]  MRN:  [id number] Subjective:  Depressed, poor sleep and energy HPI: 45 year old man. Admitted with the following report Father in law passed away one week ago. States he was very close to him . States he has a long history of depression, but has felt more depressed since his death. Patient states " he took me off the streets and was good to me, the closest thing to a dad I have had" .  Patient presented to the ED reporting worsening depression and suicidal ideations of hanging self . States , " I was depressed before that, but I guess him dying just pushed me over the edge   On evaluation today still endorses feeling depressed over his father in law and coudnt handle the stress. Feeling down. Decrease energy and motivation. No psychotic symptoms  Principal Problem: Major depression ([place]) Diagnosis:   Patient Active Problem List   Diagnosis Date Noted  . Major depression ([place]) [F32.9] 10/17/2015  . Major depressive disorder, recurrent, severe without psychotic features ([place]) [F33.2]   . Opiate dependence ([place]) [F11.20] 07/10/2014  . Severe major depression,recurrent without psychotic features [F32.2] 01/22/2014  . Suicidal ideations [R45.851] 10/27/2013   Total Time spent with patient: 30 minutes  Past Psychiatric History: Depression  Past Medical History:  Past Medical History  Diagnosis Date  . Diabetes mellitus   . Hypertension   . Diabetic neuropathy [facility])     Past Surgical History  Procedure Laterality Date  . Mouth surgery    . Tympanostomy tube placement     Family History: History reviewed. No pertinent family history.  Social History:  History  Alcohol Use No    Comment: Patient denies      History  Drug Use No    Comment: Patient denies current use     Social History   Social History  . Marital Status: Single    Spouse Name: N/A  . Number of Children: N/A  . Years of Education: N/A   Social  History Main Topics  . Smoking status: Current Every Day Smoker -- 1.00 packs/day    Types: Cigarettes  . Smokeless tobacco: None  . Alcohol Use: No     Comment: Patient denies   . Drug Use: No     Comment: Patient denies current use   . Sexual Activity: No   Other Topics Concern  . None   Social History Narrative   Additional Social History:    Pain Medications: denies Prescriptions: denies Over the Counter: denies History of alcohol / drug use?: No history of alcohol / drug abuse                    Sleep: Fair  Appetite:  Fair  Current Medications: Current Facility-Administered Medications  Medication Dose Route Frequency Provider Last Rate Last Dose  . acetaminophen (TYLENOL) tablet 650 mg  650 mg Oral Q6H PRN Dara Hoyer, PA-C      . alum & mag hydroxide-simeth (MAALOX/MYLANTA) 200-200-20 MG/5ML suspension 30 mL  30 mL Oral Q4H PRN Dara Hoyer, PA-C      . FLUoxetine (PROZAC) capsule 20 mg  20 mg Oral Daily Jenne Campus, MD   20 mg at 10/19/15 0816  . hydrOXYzine (ATARAX/VISTARIL) tablet 50 mg  50 mg Oral Q6H PRN Dara Hoyer, PA-C   50 mg at 10/19/15 0816  . metFORMIN (GLUCOPHAGE) tablet 500 mg  500 mg Oral  BID WC Dara Hoyer, PA-C   500 mg at 10/19/15 0816  . potassium chloride (K-DUR,KLOR-CON) CR tablet 10 mEq  10 mEq Oral BID Jenne Campus, MD   10 mEq at 10/19/15 0816  . traZODone (DESYREL) tablet 50 mg  50 mg Oral QHS PRN Jenne Campus, MD   50 mg at 10/18/15 2112    Lab Results:  Results for orders placed or performed during the hospital encounter of 10/17/15 (from the past 48 hour(s))  Comprehensive metabolic panel     Status: Abnormal   Collection Time: 10/17/15  3:53 PM  Result Value Ref Range   Sodium 142 135 - 145 mmol/L   Potassium 3.3 (L) 3.5 - 5.1 mmol/L   Chloride 105 101 - 111 mmol/L   CO2 25 22 - 32 mmol/L   Glucose, Bld 166 (H) 65 - 99 mg/dL   BUN 5 (L) 6 - 20 mg/dL   Creatinine, Ser 1.01 0.61 - 1.24 mg/dL    Calcium 9.5 8.9 - 10.3 mg/dL   Total Protein 7.4 6.5 - 8.1 g/dL   Albumin 4.0 3.5 - 5.0 g/dL   AST 22 15 - 41 U/L   ALT 26 17 - 63 U/L   Alkaline Phosphatase 109 38 - 126 U/L   Total Bilirubin 1.5 (H) 0.3 - 1.2 mg/dL   GFR calc non Af Amer >60 >60 mL/min   GFR calc Af Amer >60 >60 mL/min    Comment: (NOTE) The eGFR has been calculated using the CKD EPI equation. This calculation has not been validated in all clinical situations. eGFR's persistently <60 mL/min signify possible Chronic Kidney Disease.    Anion gap 12 5 - 15  Ethanol     Status: None   Collection Time: 10/17/15  3:53 PM  Result Value Ref Range   Alcohol, Ethyl (B) <5 <5 mg/dL    Comment:        LOWEST DETECTABLE LIMIT FOR SERUM ALCOHOL IS 5 mg/dL FOR MEDICAL PURPOSES ONLY   Salicylate level     Status: None   Collection Time: 10/17/15  3:53 PM  Result Value Ref Range   Salicylate Lvl <5.3 2.8 - 30.0 mg/dL  Acetaminophen level     Status: Abnormal   Collection Time: 10/17/15  3:53 PM  Result Value Ref Range   Acetaminophen (Tylenol), Serum <10 (L) 10 - 30 ug/mL    Comment:        THERAPEUTIC CONCENTRATIONS VARY SIGNIFICANTLY. A RANGE OF 10-30 ug/mL MAY BE AN EFFECTIVE CONCENTRATION FOR MANY PATIENTS. HOWEVER, SOME ARE BEST TREATED AT CONCENTRATIONS OUTSIDE THIS RANGE. ACETAMINOPHEN CONCENTRATIONS >150 ug/mL AT 4 HOURS AFTER INGESTION AND >50 ug/mL AT 12 HOURS AFTER INGESTION ARE OFTEN ASSOCIATED WITH TOXIC REACTIONS.   cbc     Status: None   Collection Time: 10/17/15  3:53 PM  Result Value Ref Range   WBC 5.6 4.0 - 10.5 K/uL   RBC 5.25 4.22 - 5.81 MIL/uL   Hemoglobin 15.1 13.0 - 17.0 g/dL   HCT 43.5 39.0 - 52.0 %   MCV 82.9 78.0 - 100.0 fL   MCH 28.8 26.0 - 34.0 pg   MCHC 34.7 30.0 - 36.0 g/dL   RDW 13.1 11.5 - 15.5 %   Platelets 213 150 - 400 K/uL  Rapid urine drug screen (hospital performed)     Status: None   Collection Time: 10/17/15  4:40 PM  Result Value Ref Range   Opiates NONE  DETECTED NONE DETECTED  Cocaine NONE DETECTED NONE DETECTED   Benzodiazepines NONE DETECTED NONE DETECTED   Amphetamines NONE DETECTED NONE DETECTED   Tetrahydrocannabinol NONE DETECTED NONE DETECTED   Barbiturates NONE DETECTED NONE DETECTED    Comment:        DRUG SCREEN FOR MEDICAL PURPOSES ONLY.  IF CONFIRMATION IS NEEDED FOR ANY PURPOSE, NOTIFY LAB WITHIN 5 DAYS.        LOWEST DETECTABLE LIMITS FOR URINE DRUG SCREEN Drug Class       Cutoff (ng/mL) Amphetamine      1000 Barbiturate      200 Benzodiazepine   750 Tricyclics       518 Opiates          300 Cocaine          300 THC              50     Blood Alcohol level:  Lab Results  Component Value Date   ETH <5 10/17/2015   ETH <5 12/29/2014    Physical Findings: AIMS: Facial and Oral Movements Muscles of Facial Expression: None, normal Lips and Perioral Area: None, normal Jaw: None, normal Tongue: None, normal,Extremity Movements Upper (arms, wrists, hands, fingers): None, normal Lower (legs, knees, ankles, toes): None, normal, Trunk Movements Neck, shoulders, hips: None, normal, Overall Severity Severity of abnormal movements (highest score from questions above): None, normal Incapacitation due to abnormal movements: None, normal Patient's awareness of abnormal movements (rate only patient's report): No Awareness, Dental Status Current problems with teeth and/or dentures?: No Does patient usually wear dentures?: No  CIWA:  CIWA-Ar Total: 2 COWS:  COWS Total Score: 2  Musculoskeletal: Strength & Muscle Tone: within normal limits Gait & Station: normal Patient leans: no lean  Psychiatric Specialty Exam: Review of Systems  Constitutional: Negative for fever.  Cardiovascular: Negative for chest pain.  Skin: Negative for rash.  Psychiatric/Behavioral: Positive for depression. The patient is nervous/anxious.     Blood pressure 105/61, pulse 68, temperature 97.8 F (36.6 C), temperature source Oral, resp.  rate 16, height '5\' 8"'  (1.727 m), weight 100.245 kg (221 lb).Body mass index is 33.61 kg/(m^2).  General Appearance: Casual  Eye Contact::  Fair  Speech:  Normal Rate  Volume:  Decreased  Mood:  Depressed and Dysphoric  Affect:  Constricted and Depressed  Thought Process:  Coherent  Orientation:  Full (Time, Place, and Person)  Thought Content:  Rumination  Suicidal Thoughts:  No  Homicidal Thoughts:  No  Memory:  Immediate;   Fair Recent;   Fair  Judgement:  Poor  Insight:  Shallow  Psychomotor Activity:  Decreased  Concentration:  Fair  Recall:  [place]: Fair  Akathisia:  Negative  Handed:  Right  AIMS (if indicated):     Assets:  Desire for Improvement  ADL's:  Intact  Cognition: WNL  Sleep:  Number of Hours: 6.75   Treatment Plan Summary: Daily contact with patient to assess and evaluate symptoms and progress in treatment  Plan as follows Depression: started on prozac. Tolerating it. Will review if increase dose needed Grief: attend groups and prozac as above Vitals stable.    Merian Capron, MD 10/19/2015, 11:57 AM

## 2015-10-19 NOTE — Progress Notes (Signed)
[facility] Group Notes:  (Nursing/MHT/Case Management/Adjunct)  Date:  10/19/2015  Time:  11:43 PM  Type of Therapy:  Psychoeducational Skills  Participation Level:  Active  Participation Quality:  Appropriate  Affect:  Appropriate  Cognitive:  Appropriate  Insight:  Good  Engagement in Group:  Engaged  Modes of Intervention:  Education  Summary of Progress/Problems: The patient indicated that his day was "all right" since he was thinking about his recent loss. The patient spoke at great length about his late father-in-law and what he meant to him. In addition, the patient mentioned that he had an incident in the cafeteria earlier in the day and how he calmed himself down. As for the theme of the day, his coping skill will be to continue to talk about his loss and learn how to grieve.   Richard [name], Richard [name] 10/19/2015, 11:43 PM

## 2015-10-19 NOTE — [facility] Suicide Risk Assessment (Signed)
[facility] INPATIENT:  Family/Significant Other Suicide Prevention Education  Suicide Prevention Education:  Patient Refusal for Family/Significant Other Suicide Prevention Education: The patient Richard [name] has refused to provide written consent for family/significant other to be provided Family/Significant Other Suicide Prevention Education during admission and/or prior to discharge.  Physician notified.  Pt adamantly refused for anyone in his family or life to be contacted, stating that he is a grown man and can take care of himself, does not need to bother anyone else.  The brochure was reviewed with patient who was not receptive, but listened nonetheless and accepted a copy of the brochure.  Richard [name], Richard [name] 10/19/2015, 5:29 PM

## 2015-10-19 NOTE — [facility] Group Notes (Signed)
[facility] Group Notes:  (Nursing/MHT/Case Management/Adjunct)  Date:  10/19/2015  Time:  0900 am  Type of Therapy:  Psychoeducational Skills  Participation Level:  Did Not Attend  Patient invited; declined to attend.  Cranford MonBeaudry, Nisreen Guise Evans 10/19/2015, 9:11 AM

## 2015-10-19 NOTE — Plan of Care (Signed)
Problem: Ineffective individual coping Goal: STG: Patient will remain free from self harm Outcome: Progressing Patient has not engaged in self harm. Denies SI this AM.  Problem: Alteration in mood Goal: STG-Patient is able to discuss feelings and issues (Patient is able to discuss feelings and issues leading to depression)  Outcome: Not Progressing Patient guarded, minimal information forwarded. Minimizing symptoms, current status.

## 2015-10-19 NOTE — Progress Notes (Signed)
Patient isolative to room this AM. Will not engage in eye contact, minimal conversation. Mood irritable. Refusing to get out of bed for meds. Refusing self inventory. Minimizing symptoms as patient with significant sadness, depression and SI as recently as last evening. Medicated per orders, vistaril given for anxiety/irritabilty/agitation and hx of aggression. Med education provided along with emotional support. Encouraged completion of self inventory. Denies pain, physical problems. No SI/HI/AVH and remains safe on level III obs. Will continue to monitor closely.

## 2015-10-20 DIAGNOSIS — F332 Major depressive disorder, recurrent severe without psychotic features: Secondary | ICD-10-CM | POA: Insufficient documentation

## 2015-10-20 MED ORDER — HYDROXYZINE HCL 50 MG PO TABS: 50.0000 mg | ORAL_TABLET | Freq: Four times a day (QID) | ORAL | Status: DC | PRN

## 2015-10-20 MED ORDER — METFORMIN HCL 500 MG PO TABS: 500.0000 mg | ORAL_TABLET | Freq: Two times a day (BID) | ORAL | Status: DC

## 2015-10-20 MED ORDER — FLUOXETINE HCL 20 MG PO CAPS: 20.0000 mg | ORAL_CAPSULE | Freq: Every day | ORAL | Status: DC

## 2015-10-20 MED ORDER — TRAZODONE HCL 50 MG PO TABS: 50.0000 mg | ORAL_TABLET | Freq: Every evening | ORAL | Status: DC | PRN

## 2015-10-20 NOTE — Discharge Summary (Signed)
Physician Discharge Summary Note  Patient:  Richard [name] is an 45 y.o., male MRN:  [id number] DOB:  08/06/1970 Patient phone:  [phone] (home)  Patient address:   [address] [place] [address],  Total Time spent with patient: Greater than 30 minutes  Date of Admission:  10/17/2015  Date of Discharge: 10-20-15  Reason for Admission: Worsening symptoms of depression.  Discharge Diagnoses: Principal Problem:   Major depression ([place]) Active Problems:   Severe episode of recurrent major depressive disorder, without psychotic features [facility])  Psychiatric Specialty Exam: Physical Exam  Constitutional: He is oriented to person, place, and time. He appears well-developed and well-nourished.  HENT:  Head: Normocephalic.  Eyes: Pupils are equal, round, and reactive to light.  Neck: Normal range of motion.  Cardiovascular: Normal rate.   Respiratory: Effort normal.  GI: Soft.  Genitourinary:  Denies any issues in this area  Musculoskeletal: Normal range of motion.  Neurological: He is alert and oriented to person, place, and time.  Skin: Skin is warm and dry.  Psychiatric: His speech is normal and behavior is normal. Judgment normal. His mood appears not anxious. His affect is not angry, not blunt, not labile and not inappropriate. He is not actively hallucinating. Thought content is not paranoid and not delusional. Cognition and memory are normal. He does not exhibit a depressed mood. He expresses no homicidal and no suicidal ideation.    Review of Systems  Constitutional: Negative.   HENT: Negative.   Eyes: Negative.   Respiratory: Negative.   Cardiovascular: Negative.   Gastrointestinal: Negative.   Genitourinary: Negative.   Musculoskeletal: Negative.   Skin: Negative.   Neurological: Negative.   Endo/Heme/Allergies: Negative.   Psychiatric/Behavioral: Positive for depression (Stabilized with medication prior to discharge) and substance abuse (Hx. opioid  dependence  ). Negative for suicidal ideas, hallucinations and memory loss. The patient has insomnia (Stabilized with medication prior to dicharge). The patient is not nervous/anxious.     Blood pressure 104/63, pulse 74, temperature 97.5 F (36.4 C), temperature source Oral, resp. rate 16, height '5\' 8"'  (1.727 m), weight 100.245 kg (221 lb).Body mass index is 33.61 kg/(m^2).    See Md's SRA   Musculoskeletal:  Strength & Muscle Tone: within normal limits  Gait & Station: normal  Patient leans: N/A  Depressive Disorders:  MDD (major depressive disorder)  AXIS III:   Past Medical History  Diagnosis Date  . Diabetes mellitus   . Hypertension   . Diabetic neuropathy ([place])    Level of Care:  OP  Hospital Course:  87 year old man. Father in-law passed away one week ago. States he was very close to him .States he has a long history of depression, but has felt more depressed since his death. Patient states " he took me off the streets and was good to me, the closest thing to a dad I have had" .  Patient presented to the ED reporting worsening depression and suicidal ideations of hanging self . States , " I was depressed before that, but I guess him dying just pushed me over the edge ".  Richard [name] was admitted to the hospital for worsening symptoms of depression. He cited the recent date of his father-inlaw as the trigger. He was here for mood stabilization treatments. After his admission assessment, Richard [name]'s presenting symptoms were identified. The medication regimen targeting those symptoms were initiated. She was medicated & discharged on; Fluoxetine 20 mg for depression, Hydroxyzine 50 mg for anxiety &  Trazodone 50 mg for insomnia. His other pre-existing medical problems were identified & treated by resuming his pertinent home medications for those health issues. He was enrolled & participated in the group counseling sessions being offered & held on this unit. He learned coping skills..  During  the course of his hospitalization, Richard [name]'s improvement was monitored by observation & her daily report of symptom reduction. His emotional and mental status was monitored by daily self-inventory reports completed by him & the clinical staff. He was evaluated by the treatment team for mood stability and plans for continued recovery after discharge. Richard [name]'s motivation was an integral factor in his mood stability. He was offered further treatment options upon discharge at discharge to continue further mental health care.   Upon discharge, Richard [name] was both mentally and medically stable. He denies suicidal/homicidal ideations, auditory/visual/tactile hallucinations, delusional thoughts or paranoia. He received from the pharmacy, a 7 days worth, supply samples of her [facility] discharge medications. He left [facility] with all belongings in no distress. Transportation per friend.  Consults:  psychiatry  Significant Diagnostic Studies:  Unremarkable at this time  Discharge Vitals:   Blood pressure 104/63, pulse 74, temperature 97.5 F (36.4 C), temperature source Oral, resp. rate 16, height '5\' 8"'  (1.727 m), weight 100.245 kg (221 lb). Body mass index is 33.61 kg/(m^2). Lab Results:   Results for orders placed or performed during the hospital encounter of 10/17/15 (from the past 72 hour(s))  Comprehensive metabolic panel     Status: Abnormal   Collection Time: 10/17/15  3:53 PM  Result Value Ref Range   Sodium 142 135 - 145 mmol/L   Potassium 3.3 (L) 3.5 - 5.1 mmol/L   Chloride 105 101 - 111 mmol/L   CO2 25 22 - 32 mmol/L   Glucose, Bld 166 (H) 65 - 99 mg/dL   BUN 5 (L) 6 - 20 mg/dL   Creatinine, Ser 1.01 0.61 - 1.24 mg/dL   Calcium 9.5 8.9 - 10.3 mg/dL   Total Protein 7.4 6.5 - 8.1 g/dL   Albumin 4.0 3.5 - 5.0 g/dL   AST 22 15 - 41 U/L   ALT 26 17 - 63 U/L   Alkaline Phosphatase 109 38 - 126 U/L   Total Bilirubin 1.5 (H) 0.3 - 1.2 mg/dL   GFR calc non Af Amer >60 >60 mL/min   GFR calc Af Amer >60 >60  mL/min    Comment: (NOTE) The eGFR has been calculated using the CKD EPI equation. This calculation has not been validated in all clinical situations. eGFR's persistently <60 mL/min signify possible Chronic Kidney Disease.    Anion gap 12 5 - 15  Ethanol     Status: None   Collection Time: 10/17/15  3:53 PM  Result Value Ref Range   Alcohol, Ethyl (B) <5 <5 mg/dL    Comment:        LOWEST DETECTABLE LIMIT FOR SERUM ALCOHOL IS 5 mg/dL FOR MEDICAL PURPOSES ONLY   Salicylate level     Status: None   Collection Time: 10/17/15  3:53 PM  Result Value Ref Range   Salicylate Lvl <8.1 2.8 - 30.0 mg/dL  Acetaminophen level     Status: Abnormal   Collection Time: 10/17/15  3:53 PM  Result Value Ref Range   Acetaminophen (Tylenol), Serum <10 (L) 10 - 30 ug/mL    Comment:        THERAPEUTIC CONCENTRATIONS VARY SIGNIFICANTLY. A RANGE OF 10-30 ug/mL MAY BE AN EFFECTIVE CONCENTRATION FOR MANY PATIENTS.  HOWEVER, SOME ARE BEST TREATED AT CONCENTRATIONS OUTSIDE THIS RANGE. ACETAMINOPHEN CONCENTRATIONS >150 ug/mL AT 4 HOURS AFTER INGESTION AND >50 ug/mL AT 12 HOURS AFTER INGESTION ARE OFTEN ASSOCIATED WITH TOXIC REACTIONS.   cbc     Status: None   Collection Time: 10/17/15  3:53 PM  Result Value Ref Range   WBC 5.6 4.0 - 10.5 K/uL   RBC 5.25 4.22 - 5.81 MIL/uL   Hemoglobin 15.1 13.0 - 17.0 g/dL   HCT 43.5 39.0 - 52.0 %   MCV 82.9 78.0 - 100.0 fL   MCH 28.8 26.0 - 34.0 pg   MCHC 34.7 30.0 - 36.0 g/dL   RDW 13.1 11.5 - 15.5 %   Platelets 213 150 - 400 K/uL  Rapid urine drug screen (hospital performed)     Status: None   Collection Time: 10/17/15  4:40 PM  Result Value Ref Range   Opiates NONE DETECTED NONE DETECTED   Cocaine NONE DETECTED NONE DETECTED   Benzodiazepines NONE DETECTED NONE DETECTED   Amphetamines NONE DETECTED NONE DETECTED   Tetrahydrocannabinol NONE DETECTED NONE DETECTED   Barbiturates NONE DETECTED NONE DETECTED    Comment:        DRUG SCREEN FOR MEDICAL  PURPOSES ONLY.  IF CONFIRMATION IS NEEDED FOR ANY PURPOSE, NOTIFY LAB WITHIN 5 DAYS.        LOWEST DETECTABLE LIMITS FOR URINE DRUG SCREEN Drug Class       Cutoff (ng/mL) Amphetamine      1000 Barbiturate      200 Benzodiazepine   045 Tricyclics       409 Opiates          300 Cocaine          300 THC              50     Physical Findings: AIMS: Facial and Oral Movements Muscles of Facial Expression: None, normal Lips and Perioral Area: None, normal Jaw: None, normal Tongue: None, normal,Extremity Movements Upper (arms, wrists, hands, fingers): None, normal Lower (legs, knees, ankles, toes): None, normal, Trunk Movements Neck, shoulders, hips: None, normal, Overall Severity Severity of abnormal movements (highest score from questions above): None, normal Incapacitation due to abnormal movements: None, normal Patient's awareness of abnormal movements (rate only patient's report): No Awareness, Dental Status Current problems with teeth and/or dentures?: No Does patient usually wear dentures?: No  CIWA:  CIWA-Ar Total: 2 COWS:  COWS Total Score: 2  Psychiatric Specialty Exam: See Psychiatric Specialty Exam and Suicide Risk Assessment completed by Attending Physician prior to discharge.  Discharge destination:  Home  Is patient on multiple antipsychotic therapies at discharge:  No   Has Patient had three or more failed trials of antipsychotic monotherapy by history:  No  Recommended Plan for Multiple Antipsychotic Therapies: NA    Medication List    STOP taking these medications        divalproex 500 MG 24 hr tablet  Commonly known as:  DEPAKOTE ER     lisinopril 10 MG tablet  Commonly known as:  PRINIVIL,ZESTRIL     risperiDONE 1 MG tablet  Commonly known as:  RISPERDAL      TAKE these medications      Indication   FLUoxetine 20 MG capsule  Commonly known as:  PROZAC  Take 1 capsule (20 mg total) by mouth daily. For depression   Indication:  Depression      hydrOXYzine 50 MG tablet  Commonly known as:  ATARAX/VISTARIL  Take 1 tablet (50 mg total) by mouth every 6 (six) hours as needed for anxiety.   Indication:  Anxiety     metFORMIN 500 MG tablet  Commonly known as:  GLUCOPHAGE  Take 1 tablet (500 mg total) by mouth 2 (two) times daily with a meal. For diabetes   Indication:  Type 2 Diabetes     traZODone 50 MG tablet  Commonly known as:  DESYREL  Take 1 tablet (50 mg total) by mouth at bedtime as needed for sleep.   Indication:  Trouble Sleeping       Follow-up Information    Go to [facility].   Specialty:  Behavioral Health   Why:  [facility] is open Monday-Friday 8:00AM-3:00PM.  You will be given an appointment for your intakes.  Remember to ask for a voucher for medicines when you see your doctor.   Contact information:   [place] [address] [phone]      Follow-up recommendations: Activity:  As tolerated Diet: As recommended by your primary care doctor. Keep all scheduled follow-up appointments as recommended.   Comments: Take all your medications as prescribed by your mental healthcare provider. Report any adverse effects and or reactions from your medicines to your outpatient provider promptly. Patient is instructed and cautioned to not engage in alcohol and or illegal drug use while on prescription medicines. In the event of worsening symptoms, patient is instructed to call the crisis hotline, 911 and or go to the nearest ED for appropriate evaluation and treatment of symptoms. Follow-up with your primary care provider for your other medical issues, concerns and or health care needs.  Signed: Encarnacion Slates, PMHNP, FNP-BC I have examined the patient and agree with the discharge plan and findings. I also did suicide assessment on this patient.

## 2015-10-20 NOTE — Progress Notes (Signed)
Pt discharged home with a bus pass. Pt was ambulatory, stable and appreciative at that time. All papers and prescriptions were given and valuables returned. Verbal understanding expressed. Denies SI/HI and A/VH. Pt given opportunity to express concerns and ask questions. 

## 2015-10-20 NOTE — Progress Notes (Signed)
  [facility] :  Will you be returning to the same living situation after discharge:  Yes,  to sister-in-law's after a couple of days at a friend's house At discharge, do you have transportation home?: Yes,  ride coming Do you have the ability to pay for your medications: No.  Discussed voucher and patient assistance at [facility].  Release of information consent forms completed and in the chart;  Patient's signature needed at discharge.  Patient to Follow up at: Follow-up Information    [facility]   Specialty:  Behavioral Health   Why:  Walk-In Clinic is open Monday-Friday 8:00AM-3:00PM.  You will be given an appointment for your intakes.  Remember to ask for a voucher for medicines when you see your doctor.   Contact information:   [address] [place] [address] [phone]       Next level of care provider has access to [facility] Link:no  Safety Planning and Suicide Prevention discussed: Yes,  with pt who was not receptive, refused with family  Have you used any form of tobacco in the last 30 days? (Cigarettes, Smokeless Tobacco, Cigars, and/or Pipes): Yes  Has patient been referred to the Quitline?: Patient refused referral  Patient has been referred for addiction treatment: N/A  Sarina SerGrossman-Orr, Mkenzie Dotts Jo 10/20/2015, 11:02 AM 2

## 2015-10-20 NOTE — [facility] Group Notes (Signed)
[facility] Group Notes:  (Clinical Social Work)  10/20/2015  10:00-11:00AM  Summary of Progress/Problems:   The main focus of today's process group was to give an opportunity for everyone to talk about their mothers, whether deceased or still living.  We then discussed what impact their mothers have had on their lives, negative or positive, and whether/how it has affected their interactions with their own children, themselves, or others in their lives.  This was viewed as a cathartic exercise.  A song was played at the beginning of group and another at the end to help group members look inward.    The patient expressed that his mother is deceased and that he appreciated his mother, even though she was not always the best, because she did try.  As a result, he stated he really tries with his own children.  He came in and out of the room quite a bit.  Type of Therapy:  Process Group with Motivational Interviewing  Participation Level:  Active  Participation Quality:  Attentive  Affect:  Appropriate  Cognitive:  Oriented  Insight:  Developing/Improving  Engagement in Therapy:  Improving  Modes of Intervention:   Education, Support and Processing, Activity  Ambrose MantleMareida Grossman-Orr, LCSW 10/20/2015

## 2015-10-20 NOTE — [facility] Suicide Risk Assessment (Signed)
[facility]   Principal Problem: Major depression [facility](HCC) Discharge Diagnoses:  Patient Active Problem List   Diagnosis Date Noted  . Major depression (HCC) [F32.9] 10/17/2015  . Major depressive disorder, recurrent, severe without psychotic features (HCC) [F33.2]   . Opiate dependence (HCC) [F11.20] 07/10/2014  . Severe major depression,recurrent without psychotic features [F32.2] 01/22/2014  . Suicidal ideations [R45.851] 10/27/2013    Total Time spent with patient: 30 minutes  Musculoskeletal: Strength & Muscle Tone: within normal limits Gait & Station: normal Patient leans: no lean  Psychiatric Specialty Exam: Review of Systems  Constitutional: Negative for fever.  Cardiovascular: Negative for chest pain.  Skin: Negative for rash.  Psychiatric/Behavioral: Negative for depression and suicidal ideas.    Blood pressure 104/63, pulse 74, temperature 97.5 F (36.4 C), temperature source Oral, resp. rate 16, height 5\' 8"  (1.727 m), weight 100.245 kg (221 lb).Body mass index is 33.61 kg/(m^2).  General Appearance: Casual  Eye Contact::  Fair  Speech:  Normal Rate409  Volume:  Normal  Mood:  Euthymic  Affect:  Congruent  Thought Process:  Coherent  Orientation:  Full (Time, Place, and Person)  Thought Content:  Rumination  Suicidal Thoughts:  No  Homicidal Thoughts:  No  Memory:  Immediate;   Fair Recent;   Fair  Judgement:  Fair  Insight:  Present  Psychomotor Activity:  Normal  Concentration:  Fair  Recall:  Fis[employer]Fair  Fund of Knowledge:Fair  Language: Fair  Akathisia:  Negative  Handed:  Right  AIMS (if indicated):     Assets:  Desire for Improvement  Sleep:  Number of Hours: 5.5  Cognition: WNL  ADL's:  Intact   Mental Status Per Nursing Assessment::   On Admission:  Suicidal ideation indicated by patient  Demographic Factors:  Male and lost Father in law  Loss Factors: Loss of significant relationship  Historical  Factors: Impulsivity  Risk Reduction Factors:   Sense of responsibility to family and Positive coping skills or problem solving skills  Continued Clinical Symptoms:  More than one psychiatric diagnosis Unstable or Poor Therapeutic Relationship Previous Psychiatric Diagnoses and Treatments  Cognitive Features That Contribute To Risk:  None    Suicide Risk:  Minimal: No identifiable suicidal ideation.  Patients presenting with no risk factors but with morbid ruminations; may be classified as minimal risk based on the severity of the depressive symptoms  Follow-up Information    [facility]   Specialty:  Behavioral Health   Why:  Walk-In Clinic is open Monday-Friday 8:00AM-3:00PM.  You will be given an appointment for your intakes.  Remember to ask for a voucher for medicines when you see your doctor.   Contact information:   [address] [place] [address] [phone]      Follow up with recommendations and appointment. Compliance with medications.  Patient not suicidal  Mood improved See Discharge summary for details Plan Of Care/Follow-up recommendations:  Activity:  as tolerated Diet:  regular  Thresa RossAKHTAR, Lyndie Vanderloop, MD 10/20/2015, 10:32 AM

## 2016-07-30 ENCOUNTER — Inpatient Hospital Stay (HOSPITAL_COMMUNITY)
Admission: AD | Admit: 2016-07-30 | Discharge: 2016-08-04 | DRG: 885 | Disposition: A | Discharge status: Home / Self Care | Payer: Federal, State, Local not specified - Other | Attending: Psychiatry | Admitting: Psychiatry

## 2016-07-30 DIAGNOSIS — G4700 Insomnia, unspecified: Secondary | ICD-10-CM

## 2016-07-30 DIAGNOSIS — Z9114 Patient's other noncompliance with medication regimen: Secondary | ICD-10-CM

## 2016-07-30 DIAGNOSIS — F333 Major depressive disorder, recurrent, severe with psychotic symptoms: Principal | ICD-10-CM | POA: Diagnosis present

## 2016-07-30 DIAGNOSIS — F17210 Nicotine dependence, cigarettes, uncomplicated: Secondary | ICD-10-CM | POA: Diagnosis present

## 2016-07-30 DIAGNOSIS — F200 Paranoid schizophrenia: Secondary | ICD-10-CM

## 2016-07-30 DIAGNOSIS — Z818 Family history of other mental and behavioral disorders: Secondary | ICD-10-CM | POA: Diagnosis not present

## 2016-07-30 DIAGNOSIS — R45850 Homicidal ideations: Secondary | ICD-10-CM | POA: Diagnosis present

## 2016-07-30 DIAGNOSIS — F17200 Nicotine dependence, unspecified, uncomplicated: Secondary | ICD-10-CM | POA: Clinically undetermined

## 2016-07-30 DIAGNOSIS — E119 Type 2 diabetes mellitus without complications: Secondary | ICD-10-CM

## 2016-07-30 DIAGNOSIS — F329 Major depressive disorder, single episode, unspecified: Secondary | ICD-10-CM | POA: Diagnosis present

## 2016-07-30 DIAGNOSIS — Z794 Long term (current) use of insulin: Secondary | ICD-10-CM | POA: Diagnosis not present

## 2016-07-30 DIAGNOSIS — F1610 Hallucinogen abuse, uncomplicated: Secondary | ICD-10-CM | POA: Clinically undetermined

## 2016-07-30 DIAGNOSIS — I10 Essential (primary) hypertension: Secondary | ICD-10-CM | POA: Diagnosis present

## 2016-07-30 DIAGNOSIS — E1140 Type 2 diabetes mellitus with diabetic neuropathy, unspecified: Secondary | ICD-10-CM | POA: Diagnosis present

## 2016-07-30 DIAGNOSIS — F322 Major depressive disorder, single episode, severe without psychotic features: Secondary | ICD-10-CM | POA: Diagnosis present

## 2016-07-30 DIAGNOSIS — Z79899 Other long term (current) drug therapy: Secondary | ICD-10-CM | POA: Diagnosis not present

## 2016-07-30 DIAGNOSIS — F1510 Other stimulant abuse, uncomplicated: Secondary | ICD-10-CM | POA: Diagnosis present

## 2016-07-30 DIAGNOSIS — R45851 Suicidal ideations: Secondary | ICD-10-CM | POA: Diagnosis present

## 2016-07-30 DIAGNOSIS — F209 Schizophrenia, unspecified: Secondary | ICD-10-CM | POA: Diagnosis present

## 2016-07-30 HISTORY — DX: Major depressive disorder, single episode, unspecified: F32.9

## 2016-07-30 HISTORY — DX: Depression, unspecified: F32.A

## 2016-07-30 MED ORDER — TRAZODONE HCL 50 MG PO TABS
50.0000 mg | ORAL_TABLET | Freq: Every evening | ORAL | Status: DC | PRN
  Administered 2016-07-31 – 2016-08-03 (×3): 50 mg via ORAL
  Filled 2016-07-30: qty 7

## 2016-07-30 MED ORDER — METFORMIN HCL 500 MG PO TABS
500.0000 mg | ORAL_TABLET | Freq: Two times a day (BID) | ORAL | Status: DC
  Administered 2016-07-31 – 2016-08-04 (×9): 500 mg via ORAL
  Filled 2016-07-30 (×6): qty 1
  Filled 2016-07-30: qty 14
  Filled 2016-07-30 (×5): qty 1
  Filled 2016-07-30: qty 14
  Filled 2016-07-30: qty 1

## 2016-07-30 MED ORDER — OLANZAPINE 10 MG PO TBDP
10.0000 mg | ORAL_TABLET | Freq: Two times a day (BID) | ORAL | Status: DC
  Filled 2016-07-30 (×4): qty 1

## 2016-07-30 MED ORDER — ACETAMINOPHEN 325 MG PO TABS
650.0000 mg | ORAL_TABLET | Freq: Four times a day (QID) | ORAL | Status: DC | PRN
  Administered 2016-07-31 – 2016-08-04 (×2): 650 mg via ORAL

## 2016-07-30 MED ORDER — MAGNESIUM HYDROXIDE 400 MG/5ML PO SUSP
30.0000 mL | Freq: Every day | ORAL | Status: DC | PRN
  Administered 2016-08-04: 30 mL via ORAL
  Filled 2016-07-30: qty 30

## 2016-07-30 MED ORDER — HYDROXYZINE HCL 25 MG PO TABS
25.0000 mg | ORAL_TABLET | Freq: Four times a day (QID) | ORAL | Status: DC | PRN
  Filled 2016-07-30: qty 10

## 2016-07-30 MED ORDER — ALUM & MAG HYDROXIDE-SIMETH 200-200-20 MG/5ML PO SUSP: 30.0000 mL | ORAL | Status: DC | PRN

## 2016-07-30 NOTE — Progress Notes (Addendum)
07/30/16 [facility]. [facility], may arrive any time after 7 PM, Accepting Physician Is Dr. Elna BreslowEappen call report to [phone]   Timmothy EulerJean T. Kaylyn LimSutter, MSW, LCSWA Clinical Social Work Disposition [phone]

## 2016-07-30 NOTE — BH Assessment (Signed)
Tele Assessment Note   Richard [name] is an 46 y.o. male.  -Pt originally assessed by TTS on 07-26-16.  Patient at [facility] for assessment.  Patient presents to [facility] via police, he was found in a parking lot.  Patient was stating that brother in law was chasing him, trying to kill him.  Patient states that primary concern is of increasing SI and HI.  Patient is a poor historian, unable to comprehend throughout most of the assessment.  Patient was mumbling and kept repeating about how everything was getting him down and how he wants to kill himself.  Patient was unclear about living situation and was unable to obtain this information from patient.  Pt acknowledges past HI, no current, no person identified.  Patient acknowledges current A/V hallucinations, command voices.  Unclear on specifics of what voices are telling him.  When asked about outpatient care, patient was unresponsive.  Pt reportedly non-compliant with medications for last 6 months.    Patient thought process was circular in nature and hard to follow, and obsesses over paranoid thoughts.  Patient had very little eye contact and body movements and posture exaggerated.  Patient was rocking back and forth. Diagnosis: Schizophrenia  Past Medical History:  Past Medical History:  Diagnosis Date  . Diabetes mellitus   . Diabetic neuropathy (HCC)   . Hypertension     Past Surgical History:  Procedure Laterality Date  . MOUTH SURGERY    . TYMPANOSTOMY TUBE PLACEMENT      Family History: No family history on file.  Social History:  reports that he has been smoking Cigarettes.  He has been smoking about 1.00 pack per day. He does not have any smokeless tobacco history on file. He reports that he does not drink alcohol or use drugs.  Additional Social History:  Alcohol / Drug Use Pain Medications: See [facility] information Prescriptions: See [facility] information.  Patient reportedly off meds for about 6 months or more. Over  the Counter: Unknown  CIWA:   COWS:    PATIENT STRENGTHS: (choose at least two) Average or above average intelligence Communication skills  Allergies: No Known Allergies  Home Medications:  (Not in a hospital admission)  OB/GYN Status:  No LMP for male patient.  General Assessment Data Location of Assessment: [facility] Assessment Services TTS Assessment: Out of system [facility]) Is this a Tele or Face-to-Face Assessment?: Tele Assessment Is this an Initial Assessment or a Re-assessment for this encounter?: Initial Assessment Marital status: Single Is patient pregnant?: No Pregnancy Status: No Living Arrangements: Other relatives (Pt unclear about living situation.  Was living with in-laws.) Can pt return to current living arrangement?: Yes Admission Status: Involuntary Is patient capable of signing voluntary admission?: No Referral Source: Other (Police brought him in to [place].) Insurance type: SP?     Crisis Care Plan Living Arrangements: Other relatives (Pt unclear about living situation.  Was living with in-laws.) Name of Psychiatrist: None Name of Therapist: None  Education Status Is patient currently in school?: No Highest grade of school patient has completed: Unknown  Risk to self with the past 6 months Suicidal Ideation: Yes-Currently Present Has patient been a risk to self within the past 6 months prior to admission? : Yes Suicidal Intent: Yes-Currently Present Has patient had any suicidal intent within the past 6 months prior to admission? : No Is patient at risk for suicide?: Yes Suicidal Plan?: Yes-Currently Present Has patient had any suicidal plan within the past 6 months prior to  admission? : No Specify Current Suicidal Plan: Shoot self Access to Means: No What has been your use of drugs/alcohol within the last 12 months?: Unknown Previous Attempts/Gestures: Yes How many times?:  (Unknown) Other Self Harm Risks: None Triggers for Past  Attempts: Hallucinations Intentional Self Injurious Behavior: None Family Suicide History: Unknown Recent stressful life event(s): Turmoil (Comment) (Without medications) Persecutory voices/beliefs?: Yes Depression: Yes Depression Symptoms: Despondent, Guilt, Loss of interest in usual pleasures, Feeling worthless/self pity Substance abuse history and/or treatment for substance abuse?: Yes Suicide prevention information given to non-admitted patients: Not applicable  Risk to Others within the past 6 months Homicidal Ideation: Yes-Currently Present Does patient have any lifetime risk of violence toward others beyond the six months prior to admission? : Yes (comment) (Fights in the distant past) Thoughts of Harm to Others: Yes-Currently Present Comment - Thoughts of Harm to Others: Voices telling him to harm others Current Homicidal Intent: No Current Homicidal Plan: No Access to Homicidal Means: No Identified Victim: No one in particular History of harm to others?: Yes Assessment of Violence: In distant past Violent Behavior Description: Fights in distant past. Does patient have access to weapons?: No Criminal Charges Pending?: No (Unknown really) Does patient have a court date: No (Unknown) Is patient on probation?: Unknown  Psychosis Hallucinations: Auditory (Voices telling him to harm self and others) Delusions: Persecutory (People following him and out to harm him.)  Mental Status Report Appearance/Hygiene: Disheveled, Poor hygiene, In scrubs Eye Contact: Poor Motor Activity: Freedom of movement, Unremarkable Speech: Incoherent, Word salad Level of Consciousness: Quiet/awake Mood: Depressed, Anxious, Empty Affect: Apprehensive, Sad Anxiety Level: Severe Thought Processes: Irrelevant, Flight of Ideas Judgement: Unimpaired Orientation: Appropriate for developmental age Obsessive Compulsive Thoughts/Behaviors: Moderate  Cognitive Functioning Concentration:  Decreased Memory: Recent Impaired, Remote Intact IQ: Average Insight: Poor Impulse Control: Poor Appetite: Fair Weight Loss: 0 Weight Gain: 0 Sleep: Decreased Total Hours of Sleep:  (<4H/D) Vegetative Symptoms: Decreased grooming  ADLScreening [facility] Assessment Services) Patient's cognitive ability adequate to safely complete daily activities?: Yes Patient able to express need for assistance with ADLs?: Yes Independently performs ADLs?: Yes (appropriate for developmental age)  Prior Inpatient Therapy Prior Inpatient Therapy: Yes Prior Therapy Dates: May '17 Prior Therapy Facilty/Provider(s): [facility] Reason for Treatment: psychosis  Prior Outpatient Therapy Prior Outpatient Therapy: Yes Prior Therapy Dates: Unknown Prior Therapy Facilty/Provider(s): Pt does not say Reason for Treatment: med management Does patient have an ACCT team?: No Does patient have Intensive In-House Services?  : No Does patient have [facility] services? : No Does patient have P4CC services?: No  ADL Screening (condition at time of admission) Patient's cognitive ability adequate to safely complete daily activities?: Yes Is the patient deaf or have difficulty hearing?: No Does the patient have difficulty seeing, even when wearing glasses/contacts?: No Does the patient have difficulty concentrating, remembering, or making decisions?: Yes Patient able to express need for assistance with ADLs?: Yes Does the patient have difficulty dressing or bathing?: No Independently performs ADLs?: Yes (appropriate for developmental age) Does the patient have difficulty walking or climbing stairs?: No Weakness of Legs: None Weakness of Arms/Hands: None       Abuse/Neglect Assessment (Assessment to be complete while patient is alone) Physical Abuse: Denies Verbal Abuse: Denies Sexual Abuse: Denies Exploitation of patient/patient's resources: Denies Self-Neglect: Denies     Merchant navy officer (For Healthcare) Does  Patient Have a Medical Advance Directive?: No    Additional Information 1:1 In Past 12 Months?: No CIRT Risk: No Elopement Risk:  No Does patient have medical clearance?: Yes     Disposition:  Disposition Initial Assessment Completed for this Encounter: Yes Disposition of Patient: Inpatient treatment program, Referred to Type of inpatient treatment program: Adult Patient referred to:  ([facility] to Dr. Elna BreslowEappen)  Beatriz StallionHarvey, Akelia Husted Ray 07/30/2016 8:05 PM

## 2016-07-31 ENCOUNTER — Encounter (HOSPITAL_COMMUNITY): Payer: Self-pay

## 2016-07-31 DIAGNOSIS — E119 Type 2 diabetes mellitus without complications: Secondary | ICD-10-CM

## 2016-07-31 DIAGNOSIS — F1610 Hallucinogen abuse, uncomplicated: Secondary | ICD-10-CM | POA: Clinically undetermined

## 2016-07-31 DIAGNOSIS — I10 Essential (primary) hypertension: Secondary | ICD-10-CM | POA: Diagnosis present

## 2016-07-31 DIAGNOSIS — F17200 Nicotine dependence, unspecified, uncomplicated: Secondary | ICD-10-CM | POA: Clinically undetermined

## 2016-07-31 DIAGNOSIS — F333 Major depressive disorder, recurrent, severe with psychotic symptoms: Secondary | ICD-10-CM | POA: Diagnosis present

## 2016-07-31 LAB — LIPID PANEL
CHOL/HDL RATIO: 4 ratio
CHOLESTEROL: 189 mg/dL (ref 0–200)
HDL: 47 mg/dL (ref >40)
LDL CALC: 118 mg/dL — AB (ref 0–99)
TRIGLYCERIDES: 122 mg/dL (ref <150)
VLDL: 24 mg/dL (ref 0–40)

## 2016-07-31 LAB — GLUCOSE, CAPILLARY
GLUCOSE-CAPILLARY: 156 mg/dL — AB (ref 65–99)
GLUCOSE-CAPILLARY: 138 mg/dL — AB (ref 65–99)
Glucose-Capillary: 113 mg/dL — ABNORMAL HIGH (ref 65–99)
Glucose-Capillary: 126 mg/dL — ABNORMAL HIGH (ref 65–99)

## 2016-07-31 LAB — TSH: TSH: 2.91 u[IU]/mL (ref 0.350–4.500)

## 2016-07-31 MED ORDER — INSULIN ASPART 100 UNIT/ML ~~LOC~~ SOLN
0.0000 [IU] | Freq: Three times a day (TID) | SUBCUTANEOUS | Status: DC
  Administered 2016-07-31 – 2016-08-02 (×2): 2 [IU] via SUBCUTANEOUS

## 2016-07-31 MED ORDER — FLUOXETINE HCL 20 MG PO CAPS
20.0000 mg | ORAL_CAPSULE | Freq: Every day | ORAL | Status: DC
  Administered 2016-07-31 – 2016-08-04 (×5): 20 mg via ORAL
  Filled 2016-07-31 (×2): qty 1
  Filled 2016-07-31: qty 7
  Filled 2016-07-31 (×5): qty 1

## 2016-07-31 MED ORDER — INSULIN ASPART 100 UNIT/ML ~~LOC~~ SOLN: 0.0000 [IU] | Freq: Three times a day (TID) | SUBCUTANEOUS | Status: DC

## 2016-07-31 MED ORDER — INSULIN ASPART 100 UNIT/ML ~~LOC~~ SOLN: 0.0000 [IU] | Freq: Every day | SUBCUTANEOUS | Status: DC

## 2016-07-31 MED ORDER — OLANZAPINE 10 MG IM SOLR: 10.0000 mg | Freq: Three times a day (TID) | INTRAMUSCULAR | Status: DC | PRN

## 2016-07-31 MED ORDER — LISINOPRIL 5 MG PO TABS
5.0000 mg | ORAL_TABLET | Freq: Every day | ORAL | Status: DC
  Administered 2016-07-31 – 2016-08-04 (×4): 5 mg via ORAL
  Filled 2016-07-31: qty 1
  Filled 2016-07-31: qty 7
  Filled 2016-07-31 (×5): qty 1

## 2016-07-31 MED ORDER — BENZTROPINE MESYLATE 0.5 MG PO TABS
0.5000 mg | ORAL_TABLET | Freq: Every day | ORAL | Status: DC
  Administered 2016-07-31 – 2016-08-03 (×4): 0.5 mg via ORAL
  Filled 2016-07-31 (×5): qty 1
  Filled 2016-07-31: qty 7

## 2016-07-31 MED ORDER — ARIPIPRAZOLE 5 MG PO TABS
5.0000 mg | ORAL_TABLET | Freq: Every day | ORAL | Status: DC
  Administered 2016-07-31 – 2016-08-03 (×4): 5 mg via ORAL
  Filled 2016-07-31 (×4): qty 1
  Filled 2016-07-31: qty 7
  Filled 2016-07-31: qty 1

## 2016-07-31 MED ORDER — TRAZODONE HCL 50 MG PO TABS
ORAL_TABLET | ORAL | Status: AC
  Filled 2016-07-31: qty 1

## 2016-07-31 MED ORDER — ACETAMINOPHEN 325 MG PO TABS
ORAL_TABLET | ORAL | Status: AC
  Filled 2016-07-31: qty 2

## 2016-07-31 MED ORDER — OLANZAPINE 10 MG PO TBDP
10.0000 mg | ORAL_TABLET | Freq: Three times a day (TID) | ORAL | Status: DC | PRN
  Administered 2016-07-31 – 2016-08-03 (×3): 10 mg via ORAL
  Filled 2016-07-31 (×3): qty 1

## 2016-07-31 NOTE — Tx Team (Signed)
Initial Treatment Plan 07/31/2016 12:22 AM Richard ReelEddie [name] [id number]    PATIENT STRESSORS: Financial difficulties Marital or family conflict   PATIENT STRENGTHS: Ability for insight Physical Health Work skills   PATIENT IDENTIFIED PROBLEMS: Suicidal Ideations  "I want to run out in traffic or shot bleach in my veins"  Depression  "I can think right.  I need medication to help my mood"  Anxiety  "Im anxious and worry all the time.  I can sleep"           DISCHARGE CRITERIA:  Ability to meet basic life and health needs Adequate post-discharge living arrangements Improved stabilization in mood, thinking, and/or behavior Medical problems require only outpatient monitoring Need for constant or close observation no longer present  PRELIMINARY DISCHARGE PLAN: Outpatient therapy Return to previous living arrangement  PATIENT/FAMILY INVOLVEMENT: This treatment plan has been presented to and reviewed with the patient, Richard [name].  The patient has been given the opportunity to ask questions and make suggestions.  Sylvan CheeseSteven M Pacey Altizer, RN 07/31/2016, 12:22 AM

## 2016-07-31 NOTE — Progress Notes (Signed)
Writer observed patient sitting in the dayroom watching tv briefly but no interaction with peers. He had been in his room at shift change resting and reported that he was having a rough time getting any sleep earlier today because people kept opening his door. Writer explained to him that checks have to be done and we would try not to disturb his sleep tonight. He was complaint with his scheduled medications. Support given and safety maintained on unit with 15 min checks.

## 2016-07-31 NOTE — Progress Notes (Signed)
Did not attend group 
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
## 2016-07-31 NOTE — Progress Notes (Signed)
Recreation Therapy Notes  Date: 07/31/26 Time: 1000 Location: 500 Hall Dayroom  Group Topic: Communication, Team Building, Problem Solving  Goal Area(s) Addresses:  Patient will effectively work with peer towards shared goal.  Patient will identify skill used to make activity successful.  Patient will identify how skills used during activity can be used to reach post d/c goals.   Intervention: STEM Activity   Activity: [employer] Tower. In teams, patients were asked to build the tallest freestanding tower possible out of 15 pipe cleaners. Systematically resources were removed, for example patient ability to use both hands and patient ability to verbally communicate.    Education:Social Skills, Discharge Planning.   Education Outcome: Acknowledges education/In group clarification offered/Needs additional education.   Clinical Observations/Feedback: Pt did not attend group.   Caroll RancherMarjette Gerilynn Mccullars, LRT/CTRS         Caroll RancherLindsay, Roselyne Stalnaker A 07/31/2016 12:09 PM

## 2016-07-31 NOTE — [facility] Group Notes (Signed)
[facility] LCSW Group Therapy  07/31/2016  1:05 PM  Type of Therapy:  Group therapy  Participation Level:  Active  Participation Quality:  Attentive  Affect:  Flat  Cognitive:  Oriented  Insight:  Limited  Engagement in Therapy:  Limited  Modes of Intervention:  Discussion, Socialization  Summary of Progress/Problems:  Chaplain was here to lead a group on themes of hope and courage. Invited.  Chose to not attend.  Richard [name] B 07/31/2016 1:20 PM   
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
## 2016-07-31 NOTE — Progress Notes (Signed)
Recreation Therapy Notes  INPATIENT RECREATION THERAPY ASSESSMENT  Patient Details Name: Richard [name] MRN: [id number] DOB: 10-06-1970 Today's Date: 07/31/2016  Patient Stressors: Other (Comment) (People coming at me)  Pt stated he was here because he and his brother in law got into an argument.  Coping Skills:   Isolate, Arguments, Substance Abuse, Avoidance, Self-Injury, Talking, Music, Sports  Personal Challenges: Anger, Communication, Concentration, Decision-Making, Expressing Yourself, Problem-Solving, Relationships, Self-Esteem/Confidence, Social Interaction, Stress Management, Substance Abuse, Time Management  Leisure Interests (2+):  Sports - Basketball, Individual - TV  Awareness of Community Resources:  Yes  Community Resources:  Park  Current Use: Yes  Patient Strengths:  "I don't know"  Patient Identified Areas of Improvement:  Anger; more communication  Current Recreation Participation:  2-3 times a week  Patient Goal for Hospitalization:  "Try to get anger under control"  [place]  [place]idence[place]  Current [place] (including self-harm):  Yes (Rated an 8 out of 10; contracts for safety.)  Current HI:  No  Consent to Intern Participation: N/A   Caroll RancherMarjette Dortha Neighbors, LRT/CTRS  Lillia AbedLindsay, Ralphine Hinks A 07/31/2016, 12:50 PM
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
## 2016-07-31 NOTE — [facility] Suicide Risk Assessment (Signed)
[facility]   Nursing information obtained from:    Demographic factors:    Current Mental Status:    Loss Factors:    Historical Factors:    Risk Reduction Factors:     Total Time spent with patient: 20 minutes Principal Problem: MDD (major depressive disorder), recurrent, severe, with psychosis (HCC) Diagnosis:   Patient Active Problem List   Diagnosis Date Noted  . MDD (major depressive disorder), recurrent, severe, with psychosis (HCC) [F33.3] 07/31/2016  . 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 Tobacco use disorder [F17.200] 07/31/2016  . Severe episode of recurrent major depressive disorder, without psychotic features (HCC) [F33.2]   . Major depression [F32.9] 10/17/2015  . Major depressive disorder, recurrent, severe without psychotic features (HCC) [F33.2]   . Opiate dependence (HCC) [F11.20] 07/10/2014  . Suicidal ideations [R45.851] 10/27/2013   Subjective Data: Please see H&P.   Continued Clinical Symptoms:  Alcohol Use Disorder Identification Test Final Score (AUDIT): 2 The "Alcohol Use Disorders Identification Test", Guidelines for Use in Primary Care, Second Edition.  World [employer] [facility](WHO). Score between 0-7:  no or low risk or alcohol related problems. Score between 8-15:  moderate risk of alcohol related problems. Score between 16-19:  high risk of alcohol related problems. Score 20 or above:  warrants further diagnostic evaluation for alcohol dependence and treatment.   CLINICAL FACTORS:   Alcohol/Substance Abuse/Dependencies Unstable or Poor Therapeutic Relationship Previous Psychiatric Diagnoses and Treatments   Musculoskeletal: Strength & Muscle Tone: within normal limits Gait & Station: normal Patient leans: N/A  Psychiatric Specialty Exam: Physical Exam  ROS  Blood pressure 118/66, pulse 60, temperature 98.6 F (37 C), temperature source Oral, resp. rate 18, height 5\' 6"  (1.676 m), weight 93 kg (205 lb).Body mass  index is 33.09 kg/m.  Please see H&P.                                                         COGNITIVE FEATURES THAT CONTRIBUTE TO RISK:  Closed-mindedness, Polarized thinking and Thought constriction (tunnel vision)    SUICIDE RISK:   Moderate:  Frequent suicidal ideation with limited intensity, and duration, some specificity in terms of plans, no associated intent, good self-control, limited dysphoria/symptomatology, some risk factors present, and identifiable protective factors, including available and accessible social support.  PLAN OF CARE: Please see H&P.   I certify that inpatient services furnished can reasonably be expected to improve the patient's condition.   Kasmira Cacioppo, MD 07/31/2016, 11:05 AM

## 2016-07-31 NOTE — Progress Notes (Signed)
Patient has been isolative and minimally interactive, in bed most of day. With encouragement patient went to dayroom to eat lunch and then returned immediately to room. Insulin for 156 CBG - patient stated that he had never taken insulin before and was concerned. Tolerated well.

## 2016-07-31 NOTE — Progress Notes (Signed)
Inpatient Diabetes Program Recommendations  AACE/ADA: New Consensus Statement on Inpatient Glycemic Control (2015)  Target Ranges:  Prepandial:   less than 140 mg/dL      Peak postprandial:   less than 180 mg/dL (1-2 hours)      Critically ill patients:  140 - 180 mg/dL   Lab Results  Component Value Date   GLUCAP 113 (H) 07/31/2016   HGBA1C 6.3 (H) 10/01/2014    Review of Glycemic Control  Diabetes history: DM2 Outpatient Diabetes medications: metformin 500 mg bid Current orders for Inpatient glycemic control: metformin 500 mg bid, Novolog 0-15 units tidwc and hs  HgbA1C ordered and is pending.  Inpatient Diabetes Program Recommendations:    Consider reducing Novolog to 0-9 units tidwc and hs.  Only 1 blood sugar available at this time - 113 mg/dL. Last HgbA1C on 10/01/2014 - 6.3%, indicating good control.  If CBGs > 180 mg/dL, increase Novolog back to 0-15 units tidwc and hs If FBS > 180 mg/dL, consider low dose Lantus. Will await HgbA1C results.  Continue to follow. Thank you. Ailene Ardshonda Alejandra Hunt, RD, LDN, CDE Inpatient Diabetes Coordinator [phone]

## 2016-07-31 NOTE — Tx Team (Signed)
Interdisciplinary Treatment and Diagnostic Plan Update  07/31/2016 Time of Session: 11:08 AM  Richard [name] MRN: [id number]  Principal Diagnosis: MDD (major depressive disorder), recurrent, severe, with psychosis ([place])  Secondary Diagnoses: Principal Problem:   MDD (major depressive disorder), recurrent, severe, with psychosis ([place]) Active Problems:   MDMA abuse   Tobacco use disorder   Current Medications:  Current Facility-Administered Medications  Medication Dose Route Frequency Provider Last Rate Last Dose  . acetaminophen (TYLENOL) tablet 650 mg  650 mg Oral Q6H PRN Ethelene Hal, NP      . alum & mag hydroxide-simeth (MAALOX/MYLANTA) 200-200-20 MG/5ML suspension 30 mL  30 mL Oral Q4H PRN Ethelene Hal, NP      . ARIPiprazole (ABILIFY) tablet 5 mg  5 mg Oral QHS Saramma Eappen, MD      . benztropine (COGENTIN) tablet 0.5 mg  0.5 mg Oral QHS Saramma Eappen, MD      . FLUoxetine (PROZAC) capsule 20 mg  20 mg Oral Daily Ursula Alert, MD   20 mg at 07/31/16 1037  . hydrOXYzine (ATARAX/VISTARIL) tablet 25 mg  25 mg Oral Q6H PRN Ethelene Hal, NP      . insulin aspart (novoLOG) injection 0-15 Units  0-15 Units Subcutaneous TID WC Saramma Eappen, MD      . insulin aspart (novoLOG) injection 0-5 Units  0-5 Units Subcutaneous QHS Saramma Eappen, MD      . magnesium hydroxide (MILK OF MAGNESIA) suspension 30 mL  30 mL Oral Daily PRN Ethelene Hal, NP      . metFORMIN (GLUCOPHAGE) tablet 500 mg  500 mg Oral BID WC Rozetta Nunnery, NP   500 mg at 07/31/16 1037  . OLANZapine zydis (ZYPREXA) disintegrating tablet 10 mg  10 mg Oral TID PRN Ursula Alert, MD   10 mg at 07/31/16 1037   Or  . OLANZapine (ZYPREXA) injection 10 mg  10 mg Intramuscular TID PRN Ursula Alert, MD      . traZODone (DESYREL) tablet 50 mg  50 mg Oral QHS PRN Ethelene Hal, NP        PTA Medications: Prescriptions Prior to Admission  Medication Sig Dispense Refill Last Dose  .  divalproex (DEPAKOTE ER) 500 MG 24 hr tablet Take by mouth.     . risperiDONE (RISPERDAL) 1 MG tablet Take by mouth.     . risperiDONE microspheres (RISPERDAL CONSTA) 25 MG injection Inject into the muscle.     . traZODone (DESYREL) 50 MG tablet Take by mouth.     Marland Kitchen FLUoxetine (PROZAC) 20 MG capsule Take 1 capsule (20 mg total) by mouth daily. For depression 30 capsule 0   . hydrOXYzine (ATARAX/VISTARIL) 50 MG tablet Take 1 tablet (50 mg total) by mouth every 6 (six) hours as needed for anxiety. 60 tablet 0   . metFORMIN (GLUCOPHAGE) 500 MG tablet Take 1 tablet (500 mg total) by mouth 2 (two) times daily with a meal. For diabetes 60 tablet 0   . traZODone (DESYREL) 50 MG tablet Take 1 tablet (50 mg total) by mouth at bedtime as needed for sleep. 30 tablet 0     Treatment Modalities: Medication Management, Group therapy, Case management,  1 to 1 session with clinician, Psychoeducation, Recreational therapy.   Physician Treatment Plan for Primary Diagnosis: MDD (major depressive disorder), recurrent, severe, with psychosis ([place]) Long Term Goal(s): Improvement in symptoms so as ready for discharge  Short Term Goals: Ability to identify changes in lifestyle to reduce recurrence  of condition will improve and Compliance with prescribed medications will improve  Medication Management: Evaluate patient's response, side effects, and tolerance of medication regimen.  Therapeutic Interventions: 1 to 1 sessions, Unit Group sessions and Medication administration.  Evaluation of Outcomes: Not Met  Physician Treatment Plan for Secondary Diagnosis: Principal Problem:   MDD (major depressive disorder), recurrent, severe, with psychosis ([place]) Active Problems:   MDMA abuse   Tobacco use disorder   Long Term Goal(s): Improvement in symptoms so as ready for discharge  Short Term Goals: Compliance with prescribed medications will improve  Medication Management: Evaluate patient's response, side  effects, and tolerance of medication regimen.  Therapeutic Interventions: 1 to 1 sessions, Unit Group sessions and Medication administration.  Evaluation of Outcomes: Not Met   RN Treatment Plan for Primary Diagnosis: MDD (major depressive disorder), recurrent, severe, with psychosis ([place]) Long Term Goal(s): Knowledge of disease and therapeutic regimen to maintain health will improve  Short Term Goals: Ability to verbalize frustration and anger appropriately will improve and Compliance with prescribed medications will improve  Medication Management: RN will administer medications as ordered by provider, will assess and evaluate patient's response and provide education to patient for prescribed medication. RN will report any adverse and/or side effects to prescribing provider.  Therapeutic Interventions: 1 on 1 counseling sessions, Psychoeducation, Medication administration, Evaluate responses to treatment, Monitor vital signs and CBGs as ordered, Perform/monitor CIWA, COWS, AIMS and Fall Risk screenings as ordered, Perform wound care treatments as ordered.  Evaluation of Outcomes: Not Met   Recreational Therapy Treatment Plan for Primary Diagnosis: MDD (major depressive disorder), recurrent, severe, with psychosis ([place]) Long Term Goal(s): LTG- Patient will participate in recreation therapy tx in at least 2 group sessions without prompting from LRT.  Short Term Goals: Pt will be able to identify at least 5 coping skills for admitting dx by conclusion of recreation therapy tx.  Treatment Modalities: Group and Pet Therapy  Therapeutic Interventions: Psychoeducation  Evaluation of Outcomes: Progressing   LCSW Treatment Plan for Primary Diagnosis: MDD (major depressive disorder), recurrent, severe, with psychosis ([place]) Long Term Goal(s): Safe transition to appropriate next level of care at discharge, Engage patient in therapeutic group addressing interpersonal concerns.  Short Term Goals:  Engage patient in aftercare planning with referrals and resources and Increase skills for wellness and recovery  Therapeutic Interventions: Assess for all discharge needs, 1 to 1 time with Social worker, Explore available resources and support systems, Assess for adequacy in community support network, Educate family and significant other(s) on suicide prevention, Complete Psychosocial Assessment, Interpersonal group therapy.  Evaluation of Outcomes: Not Met   Progress in Treatment: Attending groups: Yes Participating in groups: Yes Taking medication as prescribed: Yes, MD continues to assess for medication changes as needed Toleration medication: Yes, no side effects reported at this time Family/Significant other contact made: Attempted; Ardis Hughs, [phone])  Patient understands diagnosis: Limited insight  Discussing patient identified problems/goals with staff: Yes Medical problems stabilized or resolved: Yes Denies suicidal/homicidal ideation: No  Issues/concerns per patient self-inventory: None Other: N/A  New problem(s) identified: None identified at this time.   New Short Term/Long Term Goal(s): None identified at this time.   Discharge Plan or Barriers: Return home and follow up outpatient with [facility].  Reason for Continuation of Hospitalization: Anxiety Depression Hallucinations Medication stabilization Suicidal ideation   Estimated Length of Stay: 3-5 days  Attendees: Patient: 07/31/2016  11:08 AM  Physician: Dr. Shea Evans 07/31/2016  11:08 AM  Nursing: Jeni Salles, RN  07/31/2016  11:08 AM  RN Care Manager: Lars Pinks 07/31/2016  11:08 AM  Social Worker: Ripley Fraise, LCSW 07/31/2016  11:08 AM  Recreational Therapist: Winfield Cunas 07/31/2016  11:08 AM  Other: Radonna Ricker, Social Work Intern  07/31/2016  11:08 AM  Other:  07/31/2016  11:08 AM  Other: 07/31/2016  11:08 AM    Scribe for Treatment Team: Radonna Ricker, Social Work Intern 07/31/2016 11:08  AM

## 2016-07-31 NOTE — Progress Notes (Deleted)
Recreation Therapy Notes  Date: 07/31/26 Time: 1000 Location: 500 Hall Dayroom  Group Topic: Communication, Team Building, Problem Solving  Goal Area(s) Addresses:  Patient will effectively work with peer towards shared goal.  Patient will identify skill used to make activity successful.  Patient will identify how skills used during activity can be used to reach post d/c goals.   Behavioral Response:  Minimal  Intervention: STEM Activity   Activity: [employer] Tower. In teams, patients were asked to build the tallest freestanding tower possible out of 15 pipe cleaners. Systematically resources were removed, for example patient ability to use both hands and patient ability to verbally communicate.    Education: Pharmacist, communityocial Skills, Building control surveyorDischarge Planning.   Education Outcome: Acknowledges education/In group clarification offered/Needs additional education.   Clinical Observations/Feedback: Pt mainly observed but did help at one point make a section of the tower.  Pt was quiet for most of group.   Caroll RancherMarjette Nasiyah Laverdiere, LRT/CTRS      Caroll RancherLindsay, Otniel Hoe A 07/31/2016 12:01 PM

## 2016-08-01 DIAGNOSIS — Z818 Family history of other mental and behavioral disorders: Secondary | ICD-10-CM

## 2016-08-01 DIAGNOSIS — Z794 Long term (current) use of insulin: Secondary | ICD-10-CM

## 2016-08-01 DIAGNOSIS — E119 Type 2 diabetes mellitus without complications: Secondary | ICD-10-CM

## 2016-08-01 DIAGNOSIS — I10 Essential (primary) hypertension: Secondary | ICD-10-CM

## 2016-08-01 DIAGNOSIS — Z79899 Other long term (current) drug therapy: Secondary | ICD-10-CM

## 2016-08-01 DIAGNOSIS — F17210 Nicotine dependence, cigarettes, uncomplicated: Secondary | ICD-10-CM

## 2016-08-01 DIAGNOSIS — R45850 Homicidal ideations: Secondary | ICD-10-CM

## 2016-08-01 DIAGNOSIS — F1510 Other stimulant abuse, uncomplicated: Secondary | ICD-10-CM

## 2016-08-01 DIAGNOSIS — G4700 Insomnia, unspecified: Secondary | ICD-10-CM

## 2016-08-01 DIAGNOSIS — R45851 Suicidal ideations: Secondary | ICD-10-CM

## 2016-08-01 DIAGNOSIS — F333 Major depressive disorder, recurrent, severe with psychotic symptoms: Principal | ICD-10-CM

## 2016-08-01 LAB — GLUCOSE, CAPILLARY
GLUCOSE-CAPILLARY: 112 mg/dL — AB (ref 65–99)
Glucose-Capillary: 124 mg/dL — ABNORMAL HIGH (ref 65–99)
Glucose-Capillary: 110 mg/dL — ABNORMAL HIGH (ref 65–99)
Glucose-Capillary: 145 mg/dL — ABNORMAL HIGH (ref 65–99)

## 2016-08-01 LAB — HEMOGLOBIN A1C
HEMOGLOBIN A1C: 5.8 % — AB (ref 4.8–5.6)
Mean Plasma Glucose: 120 mg/dL

## 2016-08-01 LAB — PROLACTIN: PROLACTIN: 25.3 ng/mL — AB (ref 4.0–15.2)

## 2016-08-01 MED ORDER — TRAZODONE HCL 50 MG PO TABS
ORAL_TABLET | ORAL | Status: AC
  Filled 2016-08-01: qty 1

## 2016-08-01 NOTE — Progress Notes (Signed)
D: Patient's self inventory sheet: patient has fair sleep, received sleep medication.fair  Appetite, low energy level, poor concentration. Rated depression 7/10, hopeless 3/10, anxiety 5/10. SI/HI/AVH: Endorses all. Physical complaints are shoulder pain. Goal is "my thoughts of me dieing". Plans to work on "try to talk about it". Patient said that he still feels that there are people "out there" who want to hurt him. He said that he still thinks about suicide and is struggling with feeling like he is worthless.   A: Medications administered, assessed medication knowledge and education given on medication regimen.  Emotional support and encouragement given patient. R: Endorses both SI and HI , contracts for safety. Safety maintained with 15 minute checks.

## 2016-08-01 NOTE — Progress Notes (Signed)
[facility] MD Progress Note  08/01/2016 1:04 PM Richard [name]  MRN:  [id number]   Subjective:  Patient states that he was brought to the hospital related to being suicidal.; but is feeling better today.  "I was just a little off of it."  Reports that he is eating/sleeping without difficulty.  Tolerating medications without adverse reaction.    Objective:Patient appears disorganized and unable to remember what happened to get him in the hospital.  Would not elaborate on if hearing voices or seeing things that others could not hear or see. At this time he denied homicidal ideation, psychosis, and paranoia.  Did state that he does not want his family to see him here  Principal Problem: MDD (major depressive disorder), recurrent, severe, with psychosis (HCC) Diagnosis:   Patient Active Problem List   Diagnosis Date Noted  . MDD (major depressive disorder), recurrent, severe, with psychosis (HCC) [F33.3] 07/31/2016  . 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 Tobacco use disorder [F17.200] 07/31/2016  . Diabetes mellitus (HCC) [E11.9] 07/31/2016  . Essential hypertension [I10] 07/31/2016  . Severe episode of recurrent major depressive disorder, without psychotic features (HCC) [F33.2]   . Major depression [F32.9] 10/17/2015  . Major depressive disorder, recurrent, severe without psychotic features (HCC) [F33.2]   . Opiate dependence (HCC) [F11.20] 07/10/2014  . Suicidal ideations [R45.851] 10/27/2013   Total Time spent with patient: 20 minutes  Past Psychiatric History: Hx of MDD, was admitted to [facility] 10/17/2015- 10/20/2015, is noncompliant on medications , denies past suicide attempts.  Past Medical History:  Past Medical History:  Diagnosis Date  . Diabetes mellitus   . Diabetic neuropathy (HCC)   . Hypertension     Past Surgical History:  Procedure Laterality Date  . MOUTH SURGERY    . TYMPANOSTOMY TUBE PLACEMENT     Family History:  Family History  Problem Relation Age of Onset  . Diabetes  Mother   . Suicidality Maternal Uncle    Family Psychiatric  History: Maternal uncle committed suicide when patient was just 46 years old, he does not know the details.  Social History:  History  Alcohol Use No    Comment: Patient denies      History  Drug Use  . Types: MDMA (Ecstacy)    Comment: Patient denies current use     Social History   Social History  . Marital status: Single    Spouse name: N/A  . Number of children: N/A  . Years of education: N/A   Social History Main Topics  . Smoking status: Current Every Day Smoker    Packs/day: 1.00    Types: Cigarettes  . Smokeless tobacco: Never Used  . Alcohol use No     Comment: Patient denies   . Drug use: Yes    Types: MDMA (Ecstacy)     Comment: Patient denies current use   . Sexual activity: No   Other Topics Concern  . None   Social History Narrative  . None   Additional Social History:    Pain Medications: See [facility] information Prescriptions: See [facility] information.  Patient reportedly off meds for about 6 months or more. Over the Counter: Unknown History of alcohol / drug use?: Yes Negative Consequences of Use: Financial, Personal relationships, Work / School                    Sleep: Fair  Appetite:  Fair  Current Medications: Current Facility-Administered Medications  Medication Dose Route Frequency Provider Last Rate  Last Dose  . acetaminophen (TYLENOL) tablet 650 mg  650 mg Oral Q6H PRN Laveda AbbeLaurie Britton Parks, NP   650 mg at 07/31/16 2228  . alum & mag hydroxide-simeth (MAALOX/MYLANTA) 200-200-20 MG/5ML suspension 30 mL  30 mL Oral Q4H PRN Laveda AbbeLaurie Britton Parks, NP      . ARIPiprazole (ABILIFY) tablet 5 mg  5 mg Oral QHS Jomarie LongsSaramma Eappen, MD   5 mg at 07/31/16 2224  . benztropine (COGENTIN) tablet 0.5 mg  0.5 mg Oral QHS Jomarie LongsSaramma Eappen, MD   0.5 mg at 07/31/16 2225  . FLUoxetine (PROZAC) capsule 20 mg  20 mg Oral Daily Jomarie LongsSaramma Eappen, MD   20 mg at 08/01/16 0749  . hydrOXYzine  (ATARAX/VISTARIL) tablet 25 mg  25 mg Oral Q6H PRN Laveda AbbeLaurie Britton Parks, NP      . insulin aspart (novoLOG) injection 0-5 Units  0-5 Units Subcutaneous QHS Saramma Eappen, MD      . insulin aspart (novoLOG) injection 0-9 Units  0-9 Units Subcutaneous TID WC Jomarie LongsSaramma Eappen, MD   2 Units at 07/31/16 1250  . lisinopril (PRINIVIL,ZESTRIL) tablet 5 mg  5 mg Oral Daily Jomarie LongsSaramma Eappen, MD   5 mg at 08/01/16 0750  . magnesium hydroxide (MILK OF MAGNESIA) suspension 30 mL  30 mL Oral Daily PRN Laveda AbbeLaurie Britton Parks, NP      . metFORMIN (GLUCOPHAGE) tablet 500 mg  500 mg Oral BID WC Jackelyn PolingJason A Berry, NP   500 mg at 08/01/16 0750  . OLANZapine zydis (ZYPREXA) disintegrating tablet 10 mg  10 mg Oral TID PRN Jomarie LongsSaramma Eappen, MD   10 mg at 07/31/16 1037   Or  . OLANZapine (ZYPREXA) injection 10 mg  10 mg Intramuscular TID PRN Jomarie LongsSaramma Eappen, MD      . traZODone (DESYREL) tablet 50 mg  50 mg Oral QHS PRN Laveda AbbeLaurie Britton Parks, NP   50 mg at 07/31/16 2227    Lab Results:  Results for orders placed or performed during the hospital encounter of 07/30/16 (from the past 48 hour(s))  Glucose, capillary     Status: Abnormal   Collection Time: 07/31/16  5:56 AM  Result Value Ref Range   Glucose-Capillary 113 (H) 65 - 99 mg/dL   Comment 1 Notify RN   Prolactin     Status: Abnormal   Collection Time: 07/31/16  6:16 AM  Result Value Ref Range   Prolactin 25.3 (H) 4.0 - 15.2 ng/mL    Comment: (NOTE) Performed At: [facility] [address] [place], [place] [id number] Mila HomerHancock William F MD [phone] Performed at [facility], [address] [place], [place] [address]   Hemoglobin A1c     Status: Abnormal   Collection Time: 07/31/16  6:16 AM  Result Value Ref Range   Hgb A1c MFr Bld 5.8 (H) 4.8 - 5.6 %    Comment: (NOTE)         Pre-diabetes: 5.7 - 6.4         Diabetes: >6.4         Glycemic control for adults with diabetes: <7.0    Mean Plasma Glucose 120 mg/dL    Comment:  (NOTE) Performed At: [facility] [address] [place], [place] [id number] Mila HomerHancock William F MD [phone] Performed at [facility], [address] [place], [place] [address]   TSH     Status: None   Collection Time: 07/31/16  6:16 AM  Result Value Ref Range   TSH 2.910 0.350 - 4.500  uIU/mL    Comment: Performed by a 3rd Generation assay with a functional sensitivity of <=0.01 uIU/mL. Performed at [facility], [address], [place], [place] [address]   Lipid panel     Status: Abnormal   Collection Time: 07/31/16  6:16 AM  Result Value Ref Range   Cholesterol 189 0 - 200 mg/dL   Triglycerides 604 <540 mg/dL   HDL 47 >98 mg/dL   Total CHOL/HDL Ratio 4.0 RATIO   VLDL 24 0 - 40 mg/dL   LDL Cholesterol 119 (H) 0 - 99 mg/dL    Comment:        Total Cholesterol/HDL:CHD Risk Coronary Heart Disease Risk Table                     Men   Women  1/2 Average Risk   3.4   3.3  Average Risk       5.0   4.4  2 X Average Risk   9.6   7.1  3 X Average Risk  23.4   11.0        Use the calculated Patient Ratio above and the CHD Risk Table to determine the patient's CHD Risk.        ATP III CLASSIFICATION (LDL):  <100     mg/dL   Optimal  147-829  mg/dL   Near or Above                    Optimal  130-159  mg/dL   Borderline  562-130  mg/dL   High  >865     mg/dL   Very High Performed at [facility], [address] [place], [place] [address]   Glucose, capillary     Status: Abnormal   Collection Time: 07/31/16 12:14 PM  Result Value Ref Range   Glucose-Capillary 156 (H) 65 - 99 mg/dL  Glucose, capillary     Status: Abnormal   Collection Time: 07/31/16  4:53 PM  Result Value Ref Range   Glucose-Capillary 138 (H) 65 - 99 mg/dL  Glucose, capillary     Status: Abnormal   Collection Time: 07/31/16  8:56 PM  Result Value Ref Range   Glucose-Capillary 126 (H) 65 - 99 mg/dL  Glucose, capillary     Status: Abnormal   Collection  Time: 08/01/16  6:39 AM  Result Value Ref Range   Glucose-Capillary 124 (H) 65 - 99 mg/dL  Glucose, capillary     Status: Abnormal   Collection Time: 08/01/16 11:35 AM  Result Value Ref Range   Glucose-Capillary 110 (H) 65 - 99 mg/dL    Blood Alcohol level:  Lab Results  Component Value Date   ETH <5 10/17/2015   ETH <5 12/29/2014    Metabolic Disorder Labs: Lab Results  Component Value Date   HGBA1C 5.8 (H) 07/31/2016   MPG 120 07/31/2016   MPG 134 10/01/2014   Lab Results  Component Value Date   PROLACTIN 25.3 (H) 07/31/2016   Lab Results  Component Value Date   CHOL 189 07/31/2016   TRIG 122 07/31/2016   HDL 47 07/31/2016   CHOLHDL 4.0 07/31/2016   VLDL 24 07/31/2016   LDLCALC 118 (H) 07/31/2016    Physical Findings: AIMS: Facial and Oral Movements Muscles of Facial Expression: None, normal Lips and Perioral Area: None, normal Jaw: None, normal Tongue: None, normal,Extremity Movements Upper (arms, wrists, hands, fingers): None, normal Lower (legs, knees, ankles, toes): None, normal,  Trunk Movements Neck, shoulders, hips: None, normal, Overall Severity Severity of abnormal movements (highest score from questions above): None, normal Incapacitation due to abnormal movements: None, normal Patient's awareness of abnormal movements (rate only patient's report): No Awareness, Dental Status Current problems with teeth and/or dentures?: No Does patient usually wear dentures?: No  CIWA:  CIWA-Ar Total: 3 COWS:     Musculoskeletal: Strength & Muscle Tone: within normal limits Gait & Station: normal Patient leans: N/A  Psychiatric Specialty Exam: Physical Exam  Constitutional: He is oriented to person, place, and time. He appears well-developed and well-nourished.  Neck: Normal range of motion.  Respiratory: Effort normal.  Musculoskeletal: Normal range of motion.  Neurological: He is alert and oriented to person, place, and time.    ROS  Blood pressure  99/60, pulse 80, temperature 97.3 F (36.3 C), temperature source Oral, resp. rate 20, height 5\' 6"  (1.676 m), weight 93 kg (205 lb).Body mass index is 33.09 kg/m.  General Appearance: Guarded  Eye Contact:  Minimal  Speech:  Clear and Coherent and Normal Rate  Volume:  Decreased  Mood:  Dysphoric  Affect:  Labile  Thought Process:  Goal Directed and Descriptions of Associations: Circumstantial  Orientation:  Full (Time, Place, and Person)  Thought Content:  Paranoid Ideation  Suicidal Thoughts:  Yes.  with intent/plan  Homicidal Thoughts:  Yes.  without intent/plan  Memory:  Immediate;   Fair Recent;   Fair Remote;   Fair  Judgement:  Impaired  Insight:  Shallow  Psychomotor Activity:  Restlessness  Concentration:  Concentration: Fair and Attention Span: Fair  Recall:  [employer] of Knowledge:  Fair  Language:  Fair  Akathisia:  No  Handed:  Right  AIMS (if indicated):     Assets:  Desire for Improvement  ADL's:  Intact  Cognition:  WNL  Sleep:  Number of Hours: 6.75     Treatment Plan Summary: Reviewed Chart; Labs; and discussed with treatment team Daily contact with patient to assess and evaluate symptoms and progress in treatment and Medication management Encouraged group sessions Observation Q 15 min for safety Continue Medications Trazodone 50 mg Q hs prn for insomnia Abilify 5 mg for Major depressive disorder with psychotic features Cogentin 0.5 mg Q hs for EPS Prozac 20 mg for depression Vistaril 20 mg Q 6 hr for anxiety  Rankin, Shuvon, NP 08/01/2016, 1:04 PM

## 2016-08-01 NOTE — Progress Notes (Signed)
Adult Psychoeducational Group Note  Date:  08/01/2016 Time:  10:11 PM  Group Topic/Focus:  Wrap-Up Group:   The focus of this group is to help patients review their daily goal of treatment and discuss progress on daily workbooks.  Participation Level:  Minimal  Participation Quality:  Appropriate  Affect:  Flat  Cognitive:  Oriented  Insight: Limited  Engagement in Group:  Engaged  Modes of Intervention:  Socialization and Support  Additional Comments:  Patient attended and participated in group tonight. He reports the he spoke with his doctor today. He went to group and went down for meals.  Lita MainsFrancis, Chrystal Zeimet [facility]ain [facility] 08/01/2016, 10:11 PM

## 2016-08-01 NOTE — [facility] Group Notes (Signed)
[facility] Group Notes:  (Clinical Social Work)  08/01/2016  11:15-12:00PM  Summary of Progress/Problems:   Today's process group involved patients discussing their feelings related to being hospitalized, as well as benefits they see to being in the hospital.  These were itemized on the whiteboard, and then the group brainstormed specific ways in which they could seek for those same benefits to happen when they discharge and go back home. The patient expressed a primary feeling about being hospitalized is sad at being away from his kids.  When asked what he can do to stay out of the hospital in order to avoid such sadness in the future, he was not sure, laughed uneasily, said he was not expecting to answer questions.  He did say he needs to stay on his medications.  Type of Therapy:  Group Therapy - Process  Participation Level:  Active  Participation Quality:  Attentive  Affect:  Blunted and Depressed  Cognitive:  Appropriate  Insight:  Improving  Engagement in Therapy:  Developing/Improving  Modes of Intervention:  Exploration, Discussion  Ambrose MantleMareida Grossman-Orr, LCSW 08/01/2016, 12:59 PM

## 2016-08-01 NOTE — Progress Notes (Signed)
Writer has observed [patient up in the dayroom this evening and he attended group. He became upset earlier because another patent was talking loudly so that the tv could not be heard. He asked the other patient to be quiet which caused issues in the dayroom. He was complaint with his medications and inquired about when he was discharging. Writer asked him to speak with his doctor about discharge. Support given and safety maintained on unit with 15 min checks.

## 2016-08-02 LAB — GLUCOSE, CAPILLARY
GLUCOSE-CAPILLARY: 116 mg/dL — AB (ref 65–99)
GLUCOSE-CAPILLARY: 115 mg/dL — AB (ref 65–99)
Glucose-Capillary: 93 mg/dL (ref 65–99)
Glucose-Capillary: 160 mg/dL — ABNORMAL HIGH (ref 65–99)

## 2016-08-02 NOTE — Progress Notes (Signed)
Patient came to nursing station complaining about his room mate walking around in the room while he was trying to sleep and he felt uncomfortable and reports that he did not like being looked at while trying to sleep. He reported that he could not sleep in the room with him. Charge nurse Claris GladdenPenny N notified of room change request.

## 2016-08-02 NOTE — Progress Notes (Signed)
[facility] MD Progress Note  08/02/2016 12:41 PM Richard [name]  MRN:  [id number]   Subjective:  Patient states that he continues to have some depression but doesn't expect it to get any better.  "I have always had some depression every since I was little.  I'm still feeling suicidal just a little bit.  I think God is the only thing holding me back form really doing it.  I have done some things before but not enough.  God is keeping me here.  But, I'm tired.  I don't have anything in my life.  Everything I touch turns to, what is that saying dust cause it show don't turn to gold."  Patient also reports that once discharged it is hard to stay on medications because he can't afford.  "Even when I go to [facility] I have to pay something and I just can't afford it"    Objective:  Patient seen, chart reviewed, and discussed with treatment team Patient seen continuously pacing up and down hall way.  Continues to endorse depression with some improvement. Sleeping well except for last night complaint of racing thoughts.  Tolerating medications without adverse reactions.     Principal Problem: MDD (major depressive disorder), recurrent, severe, with psychosis (HCC) Diagnosis:   Patient Active Problem List   Diagnosis Date Noted  . Insomnia [G47.00]   . MDD (major depressive disorder), recurrent, severe, with psychosis (HCC) [F33.3] 07/31/2016  . 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 Tobacco use disorder [F17.200] 07/31/2016  . Diabetes mellitus (HCC) [E11.9] 07/31/2016  . Essential hypertension [I10] 07/31/2016  . Severe episode of recurrent major depressive disorder, without psychotic features (HCC) [F33.2]   . Major depression [F32.9] 10/17/2015  . Major depressive disorder, recurrent, severe without psychotic features (HCC) [F33.2]   . Opiate dependence (HCC) [F11.20] 07/10/2014  . Suicidal ideations [R45.851] 10/27/2013   Total Time spent with patient: 15 minutes  Past Psychiatric History: Hx of MDD, was  admitted to [facility] 10/17/2015- 10/20/2015, is noncompliant on medications , denies past suicide attempts.  Past Medical History:  Past Medical History:  Diagnosis Date  . Diabetes mellitus   . Diabetic neuropathy (HCC)   . Hypertension     Past Surgical History:  Procedure Laterality Date  . MOUTH SURGERY    . TYMPANOSTOMY TUBE PLACEMENT     Family History:  Family History  Problem Relation Age of Onset  . Diabetes Mother   . Suicidality Maternal Uncle    Family Psychiatric  History: Maternal uncle committed suicide when patient was just 46 years old, he does not know the details.  Social History:  History  Alcohol Use No    Comment: Patient denies      History  Drug Use  . Types: MDMA (Ecstacy)    Comment: Patient denies current use     Social History   Social History  . Marital status: Single    Spouse name: N/A  . Number of children: N/A  . Years of education: N/A   Social History Main Topics  . Smoking status: Current Every Day Smoker    Packs/day: 1.00    Types: Cigarettes  . Smokeless tobacco: Never Used  . Alcohol use No     Comment: Patient denies   . Drug use: Yes    Types: MDMA (Ecstacy)     Comment: Patient denies current use   . Sexual activity: No   Other Topics Concern  . None   Social History Narrative  .  None   Additional Social History:    Pain Medications: See [facility] information Prescriptions: See [facility] information.  Patient reportedly off meds for about 6 months or more. Over the Counter: Unknown History of alcohol / drug use?: Yes Negative Consequences of Use: Financial, Personal relationships, Work / School  Sleep: Fair  Appetite:  Fair, improving  Current Medications: Current Facility-Administered Medications  Medication Dose Route Frequency Provider Last Rate Last Dose  . acetaminophen (TYLENOL) tablet 650 mg  650 mg Oral Q6H PRN Laveda Abbe, NP   650 mg at 07/31/16 2228  . alum & mag hydroxide-simeth  (MAALOX/MYLANTA) 200-200-20 MG/5ML suspension 30 mL  30 mL Oral Q4H PRN Laveda Abbe, NP      . ARIPiprazole (ABILIFY) tablet 5 mg  5 mg Oral QHS Jomarie Longs, MD   5 mg at 08/01/16 2121  . benztropine (COGENTIN) tablet 0.5 mg  0.5 mg Oral QHS Jomarie Longs, MD   0.5 mg at 08/01/16 2121  . FLUoxetine (PROZAC) capsule 20 mg  20 mg Oral Daily Jomarie Longs, MD   20 mg at 08/02/16 1610  . hydrOXYzine (ATARAX/VISTARIL) tablet 25 mg  25 mg Oral Q6H PRN Laveda Abbe, NP      . insulin aspart (novoLOG) injection 0-5 Units  0-5 Units Subcutaneous QHS Saramma Eappen, MD      . insulin aspart (novoLOG) injection 0-9 Units  0-9 Units Subcutaneous TID WC Jomarie Longs, MD   2 Units at 07/31/16 1250  . lisinopril (PRINIVIL,ZESTRIL) tablet 5 mg  5 mg Oral Daily Jomarie Longs, MD   5 mg at 08/01/16 0750  . magnesium hydroxide (MILK OF MAGNESIA) suspension 30 mL  30 mL Oral Daily PRN Laveda Abbe, NP      . metFORMIN (GLUCOPHAGE) tablet 500 mg  500 mg Oral BID WC Jackelyn Poling, NP   500 mg at 08/02/16 9604  . OLANZapine zydis (ZYPREXA) disintegrating tablet 10 mg  10 mg Oral TID PRN Jomarie Longs, MD   10 mg at 07/31/16 1037   Or  . OLANZapine (ZYPREXA) injection 10 mg  10 mg Intramuscular TID PRN Jomarie Longs, MD      . traZODone (DESYREL) tablet 50 mg  50 mg Oral QHS PRN Laveda Abbe, NP   50 mg at 08/01/16 2121    Lab Results:  Results for orders placed or performed during the hospital encounter of 07/30/16 (from the past 48 hour(s))  Glucose, capillary     Status: Abnormal   Collection Time: 07/31/16  4:53 PM  Result Value Ref Range   Glucose-Capillary 138 (H) 65 - 99 mg/dL  Glucose, capillary     Status: Abnormal   Collection Time: 07/31/16  8:56 PM  Result Value Ref Range   Glucose-Capillary 126 (H) 65 - 99 mg/dL  Glucose, capillary     Status: Abnormal   Collection Time: 08/01/16  6:39 AM  Result Value Ref Range   Glucose-Capillary 124 (H) 65 - 99 mg/dL   Glucose, capillary     Status: Abnormal   Collection Time: 08/01/16 11:35 AM  Result Value Ref Range   Glucose-Capillary 110 (H) 65 - 99 mg/dL  Glucose, capillary     Status: Abnormal   Collection Time: 08/01/16  4:57 PM  Result Value Ref Range   Glucose-Capillary 112 (H) 65 - 99 mg/dL  Glucose, capillary     Status: Abnormal   Collection Time: 08/01/16  8:38 PM  Result Value Ref Range  Glucose-Capillary 145 (H) 65 - 99 mg/dL  Glucose, capillary     Status: Abnormal   Collection Time: 08/02/16  6:14 AM  Result Value Ref Range   Glucose-Capillary 116 (H) 65 - 99 mg/dL  Glucose, capillary     Status: None   Collection Time: 08/02/16 11:16 AM  Result Value Ref Range   Glucose-Capillary 93 65 - 99 mg/dL    Blood Alcohol level:  Lab Results  Component Value Date   ETH <5 10/17/2015   ETH <5 12/29/2014    Metabolic Disorder Labs: Lab Results  Component Value Date   HGBA1C 5.8 (H) 07/31/2016   MPG 120 07/31/2016   MPG 134 10/01/2014   Lab Results  Component Value Date   PROLACTIN 25.3 (H) 07/31/2016   Lab Results  Component Value Date   CHOL 189 07/31/2016   TRIG 122 07/31/2016   HDL 47 07/31/2016   CHOLHDL 4.0 07/31/2016   VLDL 24 07/31/2016   LDLCALC 118 (H) 07/31/2016    Physical Findings: AIMS: Facial and Oral Movements Muscles of Facial Expression: None, normal Lips and Perioral Area: None, normal Jaw: None, normal Tongue: None, normal,Extremity Movements Upper (arms, wrists, hands, fingers): None, normal Lower (legs, knees, ankles, toes): None, normal, Trunk Movements Neck, shoulders, hips: None, normal, Overall Severity Severity of abnormal movements (highest score from questions above): None, normal Incapacitation due to abnormal movements: None, normal Patient's awareness of abnormal movements (rate only patient's report): No Awareness, Dental Status Current problems with teeth and/or dentures?: No Does patient usually wear dentures?: No  CIWA:   CIWA-Ar Total: 3 COWS:     Musculoskeletal: Strength & Muscle Tone: within normal limits Gait & Station: normal Patient leans: N/A  Psychiatric Specialty Exam: Physical Exam  Constitutional: He is oriented to person, place, and time. He appears well-developed and well-nourished.  Neck: Normal range of motion.  Respiratory: Effort normal.  Musculoskeletal: Normal range of motion.  Neurological: He is alert and oriented to person, place, and time.    ROS  Blood pressure 117/70, pulse 72, temperature 98 F (36.7 C), temperature source Oral, resp. rate 18, height 5\' 6"  (1.676 m), weight 93 kg (205 lb).Body mass index is 33.09 kg/m.  General Appearance: Guarded  Eye Contact:  Fair  Speech:  Clear and Coherent and Normal Rate  Volume:  Normal  Mood:  Depressed and Dysphoric  Affect:  Labile  Thought Process:  Goal Directed and Descriptions of Associations: Circumstantial  Orientation:  Full (Time, Place, and Person)  Thought Content:  Paranoid Ideation  Suicidal Thoughts:  Yes.  with intent/plan  Homicidal Thoughts:  Yes.  without intent/plan  Memory:  Immediate;   Fair Recent;   Fair Remote;   Fair  Judgement:  Impaired  Insight:  Shallow  Psychomotor Activity:  Restlessness  Concentration:  Concentration: Fair and Attention Span: Fair  Recall:  [employer]Fair  Fund of Knowledge:  Fair  Language:  Fair  Akathisia:  No  Handed:  Right  AIMS (if indicated):     Assets:  Desire for Improvement  ADL's:  Intact  Cognition:  WNL  Sleep:  Number of Hours: 6   Continue with current treatment plan; no changes made at this time  Treatment Plan Summary: Reviewed Chart; Labs; and discussed with treatment team Daily contact with patient to assess and evaluate symptoms and progress in treatment and Medication management Encouraged group sessions Observation Q 15 min for safety Continue Medications Trazodone 50 mg Q hs prn for insomnia Abilify  5 mg for Major depressive disorder with  psychotic features Cogentin 0.5 mg Q hs for EPS Prozac 20 mg for depression Vistaril 20 mg Q 6 hr for anxiety  Kamori Barbier, NP 08/02/2016, 12:41 PM

## 2016-08-02 NOTE — [facility] Group Notes (Signed)
[facility] Group Notes: (Clinical Social Work)   08/02/2016      Type of Therapy:  Group Therapy   Participation Level:  Did Not Attend despite MHT prompting   Kayloni Rocco Grossman-Orr, LCSW 08/02/2016, 3:18 PM     

## 2016-08-02 NOTE — Progress Notes (Signed)
Adult Psychoeducational Group Note  Date:  08/02/2016 Time:  8:44 PM  Group Topic/Focus:  Wrap-Up Group:   The focus of this group is to help patients review their daily goal of treatment and discuss progress on daily workbooks.  Participation Level:  Active  Participation Quality:  Appropriate  Affect:  Appropriate  Cognitive:  Alert  Insight: Appropriate  Engagement in Group:  Engaged  Modes of Intervention:  Discussion  Additional Comments:  Patient states he had an alright day. Patient's goal for today was to plan to discharge tomorrow.   Cy Bresee L Madelin Weseman 08/02/2016, 8:44 PM

## 2016-08-02 NOTE — Progress Notes (Signed)
At the beginning of the shift, pt was in his room asleep.  He did get up for group shortly after 2000.  He reports that his day has been ok.  He denies SI/HI/AVH.  He voiced no needs or concerns with Clinical research associatewriter.  Writer reviewed his scheduled hs meds and pt voiced understanding and was in agreement.  Pt would like to discharge tomorrow and will discuss this with CSW tomorrow morning.  Pt was pleasant, appropriate, and cooperative with staff.  Support and encouragement offered.  Pt makes his needs known to staff.  Discharge plans are in process.  Safety maintained with q15 minute checks.

## 2016-08-02 NOTE — Progress Notes (Signed)
D: Patient's self inventory sheet: patient has good sleep, received sleep medication.good  Appetite, normal energy level, no response concentration. Rated depression 4/10, hopeless 7/10, anxiety 7/10. SI/HI/AVH: denies all today. Physical complaints are pain in shoulder. Goal is "thoughts going threw my mind". Plans to work on "try to work hard on that" and "to see if I can get bus ticket".   A: Medications administered, assessed medication knowledge and education given on medication regimen.  Emotional support and encouragement given patient. R: Denies SI and HI , contracts for safety. Safety maintained with 15 minute checks.

## 2016-08-03 LAB — GLUCOSE, CAPILLARY
GLUCOSE-CAPILLARY: 109 mg/dL — AB (ref 65–99)
GLUCOSE-CAPILLARY: 104 mg/dL — AB (ref 65–99)
GLUCOSE-CAPILLARY: 116 mg/dL — AB (ref 65–99)
Glucose-Capillary: 100 mg/dL — ABNORMAL HIGH (ref 65–99)

## 2016-08-03 NOTE — Progress Notes (Signed)
Did not attend group 

## 2016-08-03 NOTE — Progress Notes (Signed)
DAR NOTE: Patient presents with flat affect and depressed mood.  Denies pain, auditory and visual hallucinations.  Described energy level as high and concentration as good.  Rates depression at 9, hopelessness at 7, and anxiety at 7.  Maintained on routine safety checks.  Medications given as prescribed.  Support and encouragement offered as needed.  Attended group and participated.  States goal for today is "making sure I go home."  Patient was visible in milieu.  Minimal interaction with staff and peers.  Offered no complaint.

## 2016-08-03 NOTE — Progress Notes (Signed)
[facility] MD Progress Note  08/03/2016 12:08 PM Treg Diemer  MRN:  [id number]   Subjective: Patient states ' I feel a little better. I think medications are working.'     Objective:  Patient seen and chart reviewed.Discussed patient with treatment team.  Patient seen as less irritable , tolerating medications well. Pt denies ADRs to medications. Will continue to encourage po medications. Will continue to encourage patient to attend groups.   Principal Problem: MDD (major depressive disorder), recurrent, severe, with psychosis (HCC) Diagnosis:   Patient Active Problem List   Diagnosis Date Noted  . Insomnia [G47.00]   . MDD (major depressive disorder), recurrent, severe, with psychosis (HCC) [F33.3] 07/31/2016  . 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 Tobacco use disorder [F17.200] 07/31/2016  . Diabetes mellitus (HCC) [E11.9] 07/31/2016  . Essential hypertension [I10] 07/31/2016  . Severe episode of recurrent major depressive disorder, without psychotic features (HCC) [F33.2]   . Major depression [F32.9] 10/17/2015  . Major depressive disorder, recurrent, severe without psychotic features (HCC) [F33.2]   . Opiate dependence (HCC) [F11.20] 07/10/2014  . Suicidal ideations [R45.851] 10/27/2013   Total Time spent with patient: 15 minutes  Past Psychiatric History: Please see H&P.   Past Medical History:  Past Medical History:  Diagnosis Date  . Diabetes mellitus   . Diabetic neuropathy (HCC)   . Hypertension     Past Surgical History:  Procedure Laterality Date  . MOUTH SURGERY    . TYMPANOSTOMY TUBE PLACEMENT     Family History:  Family History  Problem Relation Age of Onset  . Diabetes Mother   . Suicidality Maternal Uncle    Family Psychiatric  History: Please see H&P.   Social History: Please see H&P.  History  Alcohol Use No    Comment: Patient denies      History  Drug Use  . Types: MDMA (Ecstacy)    Comment: Patient denies current use     Social History    Social History  . Marital status: Single    Spouse name: N/A  . Number of children: N/A  . Years of education: N/A   Social History Main Topics  . Smoking status: Current Every Day Smoker    Packs/day: 1.00    Types: Cigarettes  . Smokeless tobacco: Never Used  . Alcohol use No     Comment: Patient denies   . Drug use: Yes    Types: MDMA (Ecstacy)     Comment: Patient denies current use   . Sexual activity: No   Other Topics Concern  . None   Social History Narrative  . None   Additional Social History:    Pain Medications: See [facility] information Prescriptions: See [facility] information.  Patient reportedly off meds for about 6 months or more. Over the Counter: Unknown History of alcohol / drug use?: Yes Negative Consequences of Use: Financial, Personal relationships, Work / School  Sleep: Fair  Appetite:  Fair, improving  Current Medications: Current Facility-Administered Medications  Medication Dose Route Frequency Provider Last Rate Last Dose  . acetaminophen (TYLENOL) tablet 650 mg  650 mg Oral Q6H PRN Laveda Abbe, NP   650 mg at 07/31/16 2228  . alum & mag hydroxide-simeth (MAALOX/MYLANTA) 200-200-20 MG/5ML suspension 30 mL  30 mL Oral Q4H PRN Laveda Abbe, NP      . ARIPiprazole (ABILIFY) tablet 5 mg  5 mg Oral QHS Jomarie Longs, MD   5 mg at 08/02/16 2231  . benztropine (COGENTIN) tablet 0.5  mg  0.5 mg Oral QHS Jomarie LongsSaramma Maalle Starrett, MD   0.5 mg at 08/02/16 2231  . FLUoxetine (PROZAC) capsule 20 mg  20 mg Oral Daily Manraj Yeo, MD   20 mg at 08/03/16 0800  . hydrOXYzine (ATARAX/VISTARIL) tablet 25 mg  25 mg Oral Q6H PRN Laveda AbbeLaurie Britton Parks, NP      . insulin aspart (novoLOG) injection 0-5 Units  0-5 Units Subcutaneous QHS Izabela Ow, MD      . insulin aspart (novoLOG) injection 0-9 Units  0-9 Units Subcutaneous TID WC Jomarie LongsSaramma Caprice Wasko, MD   2 Units at 08/02/16 1705  . lisinopril (PRINIVIL,ZESTRIL) tablet 5 mg  5 mg Oral Daily Eriberto Felch, MD   5 mg at 08/03/16 0800  . magnesium hydroxide (MILK OF MAGNESIA) suspension 30 mL  30 mL Oral Daily PRN Laveda AbbeLaurie Britton Parks, NP      . metFORMIN (GLUCOPHAGE) tablet 500 mg  500 mg Oral BID WC Jackelyn PolingJason A Berry, NP   500 mg at 08/03/16 0800  . OLANZapine zydis (ZYPREXA) disintegrating tablet 10 mg  10 mg Oral TID PRN Jomarie LongsSaramma Azaleah Usman, MD   10 mg at 08/02/16 2026   Or  . OLANZapine (ZYPREXA) injection 10 mg  10 mg Intramuscular TID PRN Jomarie LongsSaramma Nadea Kirkland, MD      . traZODone (DESYREL) tablet 50 mg  50 mg Oral QHS PRN Laveda AbbeLaurie Britton Parks, NP   50 mg at 08/01/16 2121    Lab Results:  Results for orders placed or performed during the hospital encounter of 07/30/16 (from the past 48 hour(s))  Glucose, capillary     Status: Abnormal   Collection Time: 08/01/16  4:57 PM  Result Value Ref Range   Glucose-Capillary 112 (H) 65 - 99 mg/dL  Glucose, capillary     Status: Abnormal   Collection Time: 08/01/16  8:38 PM  Result Value Ref Range   Glucose-Capillary 145 (H) 65 - 99 mg/dL  Glucose, capillary     Status: Abnormal   Collection Time: 08/02/16  6:14 AM  Result Value Ref Range   Glucose-Capillary 116 (H) 65 - 99 mg/dL  Glucose, capillary     Status: None   Collection Time: 08/02/16 11:16 AM  Result Value Ref Range   Glucose-Capillary 93 65 - 99 mg/dL  Glucose, capillary     Status: Abnormal   Collection Time: 08/02/16  4:37 PM  Result Value Ref Range   Glucose-Capillary 160 (H) 65 - 99 mg/dL  Glucose, capillary     Status: Abnormal   Collection Time: 08/02/16  7:56 PM  Result Value Ref Range   Glucose-Capillary 115 (H) 65 - 99 mg/dL  Glucose, capillary     Status: Abnormal   Collection Time: 08/03/16  6:17 AM  Result Value Ref Range   Glucose-Capillary 109 (H) 65 - 99 mg/dL    Blood Alcohol level:  Lab Results  Component Value Date   ETH <5 10/17/2015   ETH <5 12/29/2014    Metabolic Disorder Labs: Lab Results  Component Value Date   HGBA1C 5.8 (H) 07/31/2016   MPG 120  07/31/2016   MPG 134 10/01/2014   Lab Results  Component Value Date   PROLACTIN 25.3 (H) 07/31/2016   Lab Results  Component Value Date   CHOL 189 07/31/2016   TRIG 122 07/31/2016   HDL 47 07/31/2016   CHOLHDL 4.0 07/31/2016   VLDL 24 07/31/2016   LDLCALC 118 (H) 07/31/2016    Physical Findings: AIMS: Facial and Oral Movements Muscles  of Facial Expression: None, normal Lips and Perioral Area: None, normal Jaw: None, normal Tongue: None, normal,Extremity Movements Upper (arms, wrists, hands, fingers): None, normal Lower (legs, knees, ankles, toes): None, normal, Trunk Movements Neck, shoulders, hips: None, normal, Overall Severity Severity of abnormal movements (highest score from questions above): None, normal Incapacitation due to abnormal movements: None, normal Patient's awareness of abnormal movements (rate only patient's report): No Awareness, Dental Status Current problems with teeth and/or dentures?: No Does patient usually wear dentures?: No  CIWA:  CIWA-Ar Total: 3 COWS:     Musculoskeletal: Strength & Muscle Tone: within normal limits Gait & Station: normal Patient leans: N/A  Psychiatric Specialty Exam: Physical Exam  Nursing note and vitals reviewed.   Review of Systems  Psychiatric/Behavioral: Positive for depression and substance abuse. The patient is nervous/anxious.   All other systems reviewed and are negative.   Blood pressure (!) 108/58, pulse 78, temperature 97.9 F (36.6 C), temperature source Oral, resp. rate 18, height 5\' 6"  (1.676 m), weight 93 kg (205 lb).Body mass index is 33.09 kg/m.  General Appearance: Guarded  Eye Contact:  Fair  Speech:  Normal Rate  Volume:  Normal  Mood:  Dysphoric  Affect:  Congruent  Thought Process:  Goal Directed and Descriptions of Associations: Circumstantial  Orientation:  Full (Time, Place, and Person)  Thought Content:  Rumination  Suicidal Thoughts:  No  Homicidal Thoughts:  No  Memory:   Immediate;   Fair Recent;   Fair Remote;   Fair  Judgement:  Fair  Insight:  Shallow  Psychomotor Activity:  Normal  Concentration:  Concentration: Fair and Attention Span: Fair  Recall:  [employer] of Knowledge:  Fair  Language:  Fair  Akathisia:  No  Handed:  Right  AIMS (if indicated):     Assets:  Desire for Improvement  ADL's:  Intact  Cognition:  WNL  Sleep:  Number of Hours: 6.5   MDD (major depressive disorder), recurrent, severe, with psychosis (HCC) improving  Will continue today 08/03/16 plan as below except where it is noted.   Treatment Plan Summary: Daily contact with patient to assess and evaluate symptoms and progress in treatment and Medication management Encouraged group sessions Observation Q 15 min for safety Continue Medications Trazodone 50 mg Q hs prn for insomnia Continue Abilify 5 mg po qhs for psychosis/augment prozac. Continue Prozac 20 mg po daily for affective sx. Cogentin 0.5 mg Q hs for EPS Vistaril 20 mg Q 6 hr for anxiety Labs reviewed - Hba1c-5.8 - continue management as per diabetic consult recommendations. CSW will continue to work on disposition.  Jessia Kief, MD 08/03/2016, 12:08 PM

## 2016-08-03 NOTE — Tx Team (Signed)
Interdisciplinary Treatment and Diagnostic Plan Update  08/03/2016 Time of Session: 11:08 AM  Richard [name] MRN: [id number]  Principal Diagnosis: MDD (major depressive disorder), recurrent, severe, with psychosis (HCC)  Secondary Diagnoses: Principal Problem:   MDD (major depressive disorder), recurrent, severe, with psychosis (HCC) Active Problems:   MDMA abuse   Tobacco use disorder   Diabetes mellitus (HCC)   Essential hypertension   Insomnia   Current Medications:  Current Facility-Administered Medications  Medication Dose Route Frequency Provider Last Rate Last Dose  . acetaminophen (TYLENOL) tablet 650 mg  650 mg Oral Q6H PRN Laveda AbbeLaurie Britton Parks, NP   650 mg at 07/31/16 2228  . alum & mag hydroxide-simeth (MAALOX/MYLANTA) 200-200-20 MG/5ML suspension 30 mL  30 mL Oral Q4H PRN Laveda AbbeLaurie Britton Parks, NP      . ARIPiprazole (ABILIFY) tablet 5 mg  5 mg Oral QHS Jomarie LongsSaramma Eappen, MD   5 mg at 08/02/16 2231  . benztropine (COGENTIN) tablet 0.5 mg  0.5 mg Oral QHS Saramma Eappen, MD   0.5 mg at 08/02/16 2231  . FLUoxetine (PROZAC) capsule 20 mg  20 mg Oral Daily Saramma Eappen, MD   20 mg at 08/03/16 0800  . hydrOXYzine (ATARAX/VISTARIL) tablet 25 mg  25 mg Oral Q6H PRN Laveda AbbeLaurie Britton Parks, NP      . insulin aspart (novoLOG) injection 0-5 Units  0-5 Units Subcutaneous QHS Saramma Eappen, MD      . insulin aspart (novoLOG) injection 0-9 Units  0-9 Units Subcutaneous TID WC Jomarie LongsSaramma Eappen, MD   2 Units at 08/02/16 1705  . lisinopril (PRINIVIL,ZESTRIL) tablet 5 mg  5 mg Oral Daily Saramma Eappen, MD   5 mg at 08/03/16 0800  . magnesium hydroxide (MILK OF MAGNESIA) suspension 30 mL  30 mL Oral Daily PRN Laveda AbbeLaurie Britton Parks, NP      . metFORMIN (GLUCOPHAGE) tablet 500 mg  500 mg Oral BID WC Jackelyn PolingJason A Berry, NP   500 mg at 08/03/16 0800  . OLANZapine zydis (ZYPREXA) disintegrating tablet 10 mg  10 mg Oral TID PRN Jomarie LongsSaramma Eappen, MD   10 mg at 08/02/16 2026   Or  . OLANZapine (ZYPREXA) injection  10 mg  10 mg Intramuscular TID PRN Jomarie LongsSaramma Eappen, MD      . traZODone (DESYREL) tablet 50 mg  50 mg Oral QHS PRN Laveda AbbeLaurie Britton Parks, NP   50 mg at 08/01/16 2121    PTA Medications: Prescriptions Prior to Admission  Medication Sig Dispense Refill Last Dose  . ibuprofen (ADVIL,MOTRIN) 200 MG tablet Take 800 mg by mouth every 6 (six) hours as needed (For shoulder pain.).   Past Week  . tetrahydrozoline 0.05 % ophthalmic solution Place 2 drops into both eyes 4 (four) times daily as needed (For eye irritation.).   2-3 weeks ago  . FLUoxetine (PROZAC) 20 MG capsule Take 1 capsule (20 mg total) by mouth daily. For depression (Patient not taking: Reported on 07/31/2016) 30 capsule 0 Not Taking  . hydrOXYzine (ATARAX/VISTARIL) 50 MG tablet Take 1 tablet (50 mg total) by mouth every 6 (six) hours as needed for anxiety. (Patient not taking: Reported on 07/31/2016) 60 tablet 0 Not Taking  . metFORMIN (GLUCOPHAGE) 500 MG tablet Take 1 tablet (500 mg total) by mouth 2 (two) times daily with a meal. For diabetes (Patient not taking: Reported on 07/31/2016) 60 tablet 0 Not Taking  . traZODone (DESYREL) 50 MG tablet Take 1 tablet (50 mg total) by mouth at bedtime as needed for sleep. (Patient not  taking: Reported on 07/31/2016) 30 tablet 0 Not Taking    Treatment Modalities: Medication Management, Group therapy, Case management,  1 to 1 session with clinician, Psychoeducation, Recreational therapy.   Physician Treatment Plan for Primary Diagnosis: MDD (major depressive disorder), recurrent, severe, with psychosis (HCC) Long Term Goal(s): Improvement in symptoms so as ready for discharge  Short Term Goals: Ability to identify changes in lifestyle to reduce recurrence of condition will improve and Compliance with prescribed medications will improve  Medication Management: Evaluate patient's response, side effects, and tolerance of medication regimen.  Therapeutic Interventions: 1 to 1 sessions, Unit Group  sessions and Medication administration.  Evaluation of Outcomes: Adequate for Discharge  Physician Treatment Plan for Secondary Diagnosis: Principal Problem:   MDD (major depressive disorder), recurrent, severe, with psychosis (HCC) Active Problems:   MDMA abuse   Tobacco use disorder   Diabetes mellitus (HCC)   Essential hypertension   Insomnia   Long Term Goal(s): Improvement in symptoms so as ready for discharge  Short Term Goals: Compliance with prescribed medications will improve  Medication Management: Evaluate patient's response, side effects, and tolerance of medication regimen.  Therapeutic Interventions: 1 to 1 sessions, Unit Group sessions and Medication administration.  Evaluation of Outcomes: Adequate for Discharge   RN Treatment Plan for Primary Diagnosis: MDD (major depressive disorder), recurrent, severe, with psychosis (HCC) Long Term Goal(s): Knowledge of disease and therapeutic regimen to maintain health will improve  Short Term Goals: Ability to verbalize frustration and anger appropriately will improve and Compliance with prescribed medications will improve  Medication Management: RN will administer medications as ordered by provider, will assess and evaluate patient's response and provide education to patient for prescribed medication. RN will report any adverse and/or side effects to prescribing provider.  Therapeutic Interventions: 1 on 1 counseling sessions, Psychoeducation, Medication administration, Evaluate responses to treatment, Monitor vital signs and CBGs as ordered, Perform/monitor CIWA, COWS, AIMS and Fall Risk screenings as ordered, Perform wound care treatments as ordered.  Evaluation of Outcomes: Adequate for Discharge   Recreational Therapy Treatment Plan for Primary Diagnosis: MDD (major depressive disorder), recurrent, severe, with psychosis (HCC) Long Term Goal(s): LTG- Patient will participate in recreation therapy tx in at least 2 group  sessions without prompting from LRT.  Short Term Goals: Pt will be able to identify at least 5 coping skills for admitting dx by conclusion of recreation therapy tx.  Treatment Modalities: Group and Pet Therapy  Therapeutic Interventions: Psychoeducation  Evaluation of Outcomes: Adequate for Discharge   LCSW Treatment Plan for Primary Diagnosis: MDD (major depressive disorder), recurrent, severe, with psychosis (HCC) Long Term Goal(s): Safe transition to appropriate next level of care at discharge, Engage patient in therapeutic group addressing interpersonal concerns.  Short Term Goals: Engage patient in aftercare planning with referrals and resources and Increase skills for wellness and recovery  Therapeutic Interventions: Assess for all discharge needs, 1 to 1 time with Social worker, Explore available resources and support systems, Assess for adequacy in community support network, Educate family and significant other(s) on suicide prevention, Complete Psychosocial Assessment, Interpersonal group therapy.  Evaluation of Outcomes: Adequate for Discharge   Progress in Treatment: Attending groups: No Participating in groups: No Taking medication as prescribed: Yes, MD continues to assess for medication changes as needed Toleration medication: Yes, no side effects reported at this time Family/Significant other contact made: No Patient understands diagnosis: Limited insight  Discussing patient identified problems/goals with staff: Yes Medical problems stabilized or resolved: Yes Denies suicidal/homicidal ideation: No  Issues/concerns per patient self-inventory: None Other: N/A  New problem(s) identified: None identified at this time.   New Short Term/Long Term Goal(s): None identified at this time.   Discharge Plan or Barriers: Return home and follow up outpatient with [facility].  Reason for Continuation of Hospitalization:  Medication stabilization  Estimated Length of Stay:  Likely d/c tomorrow  Attendees: Patient: 08/03/2016  11:08 AM  Physician: Dr. Elna Breslow 08/03/2016  11:08 AM  Nursing: Jasmine December, RN  08/03/2016  11:08 AM  RN Care Manager: Onnie Boer 08/03/2016  11:08 AM  Social Worker: Richelle Ito, LCSW 08/03/2016  11:08 AM  Recreational Therapist: Aggie Cosier 08/03/2016  11:08 AM  Other: Baldo Daub, Social Work Intern  08/03/2016  11:08 AM  Other:  08/03/2016  11:08 AM  Other: 08/03/2016  11:08 AM    Scribe for Treatment Team: Richelle Ito LCSW 08/03/2016 11:08 AM

## 2016-08-03 NOTE — [facility] Group Notes (Signed)
[facility] LCSW Group Therapy  08/03/2016 1:15 pm  Type of Therapy: Process Group Therapy  Participation Level:  Active  Participation Quality:  Appropriate  Affect:  Flat  Cognitive:  Oriented  Insight:  Improving  Engagement in Group:  Limited  Engagement in Therapy:  Limited  Modes of Intervention:  Activity, Clarification, Education, Problem-solving and Support  Summary of Progress/Problems: Today's group addressed the issue of overcoming obstacles.  Patients were asked to identify their biggest obstacle post d/c that stands in the way of their on-going success, and then problem solve as to how to manage this. Stayed the entire time, engaged thoughout.  "I have 3 children, all young adults, and I know they worry about me when I am in the hospital.  That's a good thing.  Unwilling to identify any particular obstacle.  "I have a lot going on, and I don't really want to say."  However, expressed appreciation to another patient for inquiring about his situation.  "It feels like you care." [name], [name] 08/03/2016   11:14 AM

## 2016-08-03 NOTE — Progress Notes (Signed)
Recreation Therapy Notes  Date: 08/03/16 Time: 1000 Location: 500 Hall Dayroom  Group Topic: Anger Management  Goal Area(s) Addresses:  Patient will identify triggers for anger.  Patient will identify physical reaction to anger.   Patient will identify benefit of using coping skills when angry.  Behavioral Response: Minimal  Intervention: Anger umbrella worksheet, pencils  Activity:  Anger Umbrella.  Patients were given a worksheet with an umbrella on it.  Patients were to fill in the umbrella with the emotions that cause anger or situations that cause them to get angry.  Once patients identified the emotions and situations that cause anger, they were to identify coping skills and write them along the outside of the umbrella.    Education: Anger Management, Discharge Planning   Education Outcome: Acknowledges education/In group clarification offered/Needs additional education.   Clinical Observations/Feedback: Pt expressed that his mind was somewhere else and wasn't really able to focus.  Pt was able to express he get angry when "people assume things."  Pt stated some of his coping skills as "playing ball and walking off."  Pt also stated using your "coping skills will help you stay in control."   Zohar Maroney Lillia AbedLindsay, LRT/CTRS      Caroll RancherLindsay, Rumaldo Difatta A 08/03/2016 12:43 PM

## 2016-08-03 NOTE — [facility] Suicide Risk Assessment (Signed)
[facility] INPATIENT:  Family/Significant Other Suicide Prevention Education  Suicide Prevention Education:  Patient Refusal for Family/Significant Other Suicide Prevention Education: The patient Richard [name] has refused to provide written consent for family/significant other to be provided Family/Significant Other Suicide Prevention Education during admission and/or prior to discharge.  Physician notified.  [name] 08/03/2016, 10:32 AM

## 2016-08-04 LAB — GLUCOSE, CAPILLARY
GLUCOSE-CAPILLARY: 109 mg/dL — AB (ref 65–99)
Glucose-Capillary: 93 mg/dL (ref 65–99)

## 2016-08-04 MED ORDER — HYDROXYZINE HCL 25 MG PO TABS: 25.0000 mg | ORAL_TABLET | Freq: Four times a day (QID) | ORAL | 0 refills | Status: DC | PRN

## 2016-08-04 MED ORDER — ARIPIPRAZOLE 5 MG PO TABS: 5.0000 mg | ORAL_TABLET | Freq: Every day | ORAL | 0 refills | Status: DC

## 2016-08-04 MED ORDER — BENZTROPINE MESYLATE 0.5 MG PO TABS: 0.5000 mg | ORAL_TABLET | Freq: Every day | ORAL | 0 refills | Status: DC

## 2016-08-04 MED ORDER — ACETAMINOPHEN 325 MG PO TABS
ORAL_TABLET | ORAL | Status: AC
  Filled 2016-08-04: qty 2

## 2016-08-04 MED ORDER — TRAZODONE HCL 50 MG PO TABS: 50.0000 mg | ORAL_TABLET | Freq: Every evening | ORAL | 0 refills | Status: DC | PRN

## 2016-08-04 MED ORDER — FLUOXETINE HCL 20 MG PO CAPS: 20.0000 mg | ORAL_CAPSULE | Freq: Every day | ORAL | 0 refills | Status: DC

## 2016-08-04 MED ORDER — LISINOPRIL 5 MG PO TABS: 5.0000 mg | ORAL_TABLET | Freq: Every day | ORAL | 0 refills | Status: DC

## 2016-08-04 MED ORDER — METFORMIN HCL 500 MG PO TABS: 500.0000 mg | ORAL_TABLET | Freq: Two times a day (BID) | ORAL | 0 refills | Status: DC

## 2016-08-04 NOTE — Progress Notes (Signed)
Recreation Therapy Notes  Date: 08/04/16 Time: 1000 Location: 500 Hall Dayroom  Group Topic: Leisure Education  Goal Area(s) Addresses:  Patient will identify positive leisure activities.  Patient will identify one positive benefit of participation in leisure activities.  Patient will identify how using leisure time effectively will benefit them post d/c.  Behavioral Response: None  Intervention: Construction paper, scissors, markers, glue sticks, magazines  Activity: Leisure PSA.  Patients were to create a public service announcement to advertise leisure.  Patients were to include a definition of leisure, who benefits from leisure and give examples of activities they can do outdoors, indoors and by themselves.  Education:  Leisure Education, Building control surveyorDischarge Planning  Education Outcome: Acknowledges education/In group clarification offered/Needs additional education  Clinical Observations/Feedback: Pt sat quietly listening to the music.  Pt mostly observed his peers.   Caroll RancherMarjette Auri Jahnke, LRT/CTRS       Caroll RancherLindsay, Moshe Wenger A 08/04/2016 11:53 AM

## 2016-08-04 NOTE — [facility] Suicide Risk Assessment (Signed)
[facility]   Principal Problem: MDD (major depressive disorder), recurrent, severe, with psychosis (HCC) Discharge Diagnoses:  Patient Active Problem List   Diagnosis Date Noted  . Insomnia [G47.00]   . MDD (major depressive disorder), recurrent, severe, with psychosis (HCC) [F33.3] 07/31/2016  . 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 Tobacco use disorder [F17.200] 07/31/2016  . Diabetes mellitus (HCC) [E11.9] 07/31/2016  . Essential hypertension [I10] 07/31/2016  . Severe episode of recurrent major depressive disorder, without psychotic features (HCC) [F33.2]   . Major depression [F32.9] 10/17/2015  . Major depressive disorder, recurrent, severe without psychotic features (HCC) [F33.2]   . Opiate dependence (HCC) [F11.20] 07/10/2014  . Suicidal ideations [R45.851] 10/27/2013    Total Time spent with patient: 30 minutes  Musculoskeletal: Strength & Muscle Tone: within normal limits Gait & Station: normal Patient leans: N/A  Psychiatric Specialty Exam: Review of Systems  Psychiatric/Behavioral: Negative for depression and hallucinations.  All other systems reviewed and are negative.   Blood pressure 129/69, pulse 79, temperature 97.8 F (36.6 C), resp. rate 18, height 5\' 6"  (1.676 m), weight 93 kg (205 lb).Body mass index is 33.09 kg/m.  General Appearance: Casual  Eye Contact::  Fair  Speech:  Clear and Coherent409  Volume:  Normal  Mood:  Euthymic  Affect:  Congruent  Thought Process:  Goal Directed and Descriptions of Associations: Intact  Orientation:  Full (Time, Place, and Person)  Thought Content:  Logical  Suicidal Thoughts:  No  Homicidal Thoughts:  No  Memory:  Immediate;   Fair Recent;   Fair Remote;   Fair  Judgement:  Fair  Insight:  Fair  Psychomotor Activity:  Normal  Concentration:  Fair  Recall:  FiservFair  Fund of Knowledge:Fair  Language: Fair  Akathisia:  No  Handed:  Right  AIMS (if indicated):   0  Assets:  Communication  Skills Desire for Improvement  Sleep:  Number of Hours: 6.5  Cognition: WNL  ADL's:  Intact   Mental Status Per Nursing Assessment::   On Admission:     Demographic Factors:  Male  Loss Factors: NA  Historical Factors: Impulsivity  Risk Reduction Factors:   Positive social support and Positive therapeutic relationship  Continued Clinical Symptoms:  Alcohol/Substance Abuse/Dependencies Unstable or Poor Therapeutic Relationship  Cognitive Features That Contribute To Risk:  None    Suicide Risk:  Minimal: No identifiable suicidal ideation.  Patients presenting with no risk factors but with morbid ruminations; may be classified as minimal risk based on the severity of the depressive symptoms  Follow-up Information    [facility]   Specialty:  Behavioral Health Why:  Go to the walk In clinic between 8 and 11AM, M-F for your hospital follow up appointment  Be sure to go within 7 days of your discharge and bring a photo ID.  Contact information: [address] [place] [address] [phone]           Plan Of Care/Follow-up recommendations:  Activity:  no restrictions Diet:  carb modified Tests:  as per out patient recommendations Other:  none  Deaunna Olarte, MD 08/04/2016, 11:00 AM

## 2016-08-04 NOTE — Plan of Care (Signed)
Problem: [facility] Participation in Recreation Therapeutic Interventions Goal: STG-Patient will identify at least five coping skills for ** STG: Coping Skills - Patient will be able to identify at least 5 coping skills for anger by conclusion of recreation therapy tx  Outcome: Completed/Met Date Met: 08/04/16 Pt was able to identify coping skills for anger at completion of anger management recreation therapy session.    Victorino Sparrow, LRT/CTRS

## 2016-08-04 NOTE — Progress Notes (Signed)
  [facility]  Will you be returning to the same living situation after discharge:  No. Unsure.  States he will stay with a friend [facility] At discharge, do you have transportation home?: Yes,  bus pass Do you have the ability to pay for your medications: Yes,  mental health  Release of information consent forms completed and in the chart;  Patient's signature needed at discharge.  Patient to Follow up at: Follow-up Information    [facility] Follow up.   Specialty:  Behavioral Health Why:  Go to the walk In clinic between 8 and 11AM, M-F for your hospital follow up appointment  Be sure to go within 7 days of your discharge and bring a photo ID.  Contact information: [address] [place] [address] [phone]           Next level of care provider has access to [facility] Link:no  Safety Planning and Suicide Prevention discussed: Yes,  yes  Have you used any form of tobacco in the last 30 days? (Cigarettes, Smokeless Tobacco, Cigars, and/or Pipes): Yes  Has patient been referred to the Quitline?: Patient refused referral  Patient has been referred for addiction treatment: Pt. refused referral  Ida RogueRodney B Montrez [place] 08/04/2016, 10:52 AM

## 2016-08-04 NOTE — Progress Notes (Signed)
Pt has been in the dayroom all evening except during group time which he declined to attend.  He did return to the dayroom for a snack and to watch TV afterwards.  He denies SI/HI/AVH and states that he would like to discharge soon.  He voiced no needs or concerns this evening, but admits that there is a patient on the hall who is loud and intrusive that is frustrating other patients on the hall.  Pt has been able to stay in control and has remained pleasant and appropriate.  Support and encouragement offered.  Pt is med compliant on the unit.  Discharge plans are in process.  Safety maintained with q15 minute checks.

## 2016-08-04 NOTE — Discharge Summary (Signed)
Physician Discharge Summary Note  Patient:  Richard [name] is an 46 y.o., male MRN:  [id number] DOB:  [birth year] Patient phone:  [phone] (home)  Patient address:   [address] [place] [address],  Total Time spent with patient: 30 minutes  Date of Admission:  07/30/2016 Date of Discharge: 08/04/2016  Reason for Admission:  Paranoid  Principal Problem: MDD (major depressive disorder), recurrent, severe, with psychosis [facility] Discharge Diagnoses: Patient Active Problem List   Diagnosis Date Noted  . Insomnia [G47.00]   . MDD (major depressive disorder), recurrent, severe, with psychosis (HCC) [F33.3] 07/31/2016  . 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 Tobacco use disorder [F17.200] 07/31/2016  . Diabetes mellitus (HCC) [E11.9] 07/31/2016  . Essential hypertension [I10] 07/31/2016  . Severe episode of recurrent major depressive disorder, without psychotic features (HCC) [F33.2]   . Major depression [F32.9] 10/17/2015  . Major depressive disorder, recurrent, severe without psychotic features (HCC) [F33.2]   . Opiate dependence (HCC) [F11.20] 07/10/2014  . Suicidal ideations [R45.851] 10/27/2013    Past Psychiatric History: see HPI  Past Medical History:  Past Medical History:  Diagnosis Date  . Diabetes mellitus   . Diabetic neuropathy (HCC)   . Hypertension     Past Surgical History:  Procedure Laterality Date  . MOUTH SURGERY    . TYMPANOSTOMY TUBE PLACEMENT     Family History:  Family History  Problem Relation Age of Onset  . Diabetes Mother   . Suicidality Maternal Uncle    Family Psychiatric  History: see HPI Social History:  History  Alcohol Use No    Comment: Patient denies      History  Drug Use  . Types: MDMA (Ecstacy)    Comment: Patient denies current use     Social History   Social History  . Marital status: Single    Spouse name: N/A  . Number of children: N/A  . Years of education: N/A   Social History Main Topics  . Smoking status:  Current Every Day Smoker    Packs/day: 1.00    Types: Cigarettes  . Smokeless tobacco: Never Used  . Alcohol use No     Comment: Patient denies   . Drug use: Yes    Types: MDMA (Ecstacy)     Comment: Patient denies current use   . Sexual activity: No   Other Topics Concern  . None   Social History Narrative  . None    Hospital Course:  Richard [name] is a 82 y old AAM, who is separated, [place], has a hx of MDD, currently unemployed, presented IVC ed to [facility] ED, brought in by Police for SI/HI/psychosis.    Richard [name] was admitted for MDD (major depressive disorder), recurrent, severe, with psychosis (HCC) and crisis management.  Patient was treated with medications with their indications listed below in detail under Medication List.  Medical problems were identified and treated as needed.  Home medications were restarted as appropriate.  Improvement was monitored by observation and Richard [name] daily report of symptom reduction.  Emotional and mental status was monitored by daily self inventory reports completed by Richard [name] and clinical staff.  Patient reported continued improvement, denied any new concerns.  Patient had been compliant on medications and denied side effects.  Support and encouragement was provided.         Richard [name] was evaluated by the treatment team for stability and plans for continued recovery upon discharge.  Patient was offered further  treatment options upon discharge including Residential, Intensive Outpatient and Outpatient treatment. Patient will follow up with agency listed below for medication management and counseling.  Encouraged patient to maintain satisfactory support network and home environment.  Advised to adhere to medication compliance and outpatient treatment follow up.  Prescriptions provided.       Richard [name] motivation was an integral factor for scheduling further treatment.  Employment, transportation, bed availability, health  status, family support, and any pending legal issues were also considered during patient's hospital stay.  Upon completion of this admission the patient was both mentally and medically stable for discharge denying suicidal/homicidal ideation, auditory/visual/tactile hallucinations, delusional thoughts and paranoia.       Physical Findings: AIMS: Facial and Oral Movements Muscles of Facial Expression: None, normal Lips and Perioral Area: None, normal Jaw: None, normal Tongue: None, normal,Extremity Movements Upper (arms, wrists, hands, fingers): None, normal Lower (legs, knees, ankles, toes): None, normal, Trunk Movements Neck, shoulders, hips: None, normal, Overall Severity Severity of abnormal movements (highest score from questions above): None, normal Incapacitation due to abnormal movements: None, normal Patient's awareness of abnormal movements (rate only patient's report): No Awareness, Dental Status Current problems with teeth and/or dentures?: No Does patient usually wear dentures?: No  CIWA:  CIWA-Ar Total: 3 COWS:     Musculoskeletal: Strength & Muscle Tone: within normal limits Gait & Station: normal Patient leans: N/A  Psychiatric Specialty Exam:  See MD SRA Physical Exam  Nursing note and vitals reviewed.   ROS  Blood pressure 129/69, pulse 79, temperature 97.8 F (36.6 C), resp. rate 18, height 5\' 6"  (1.676 m), weight 93 kg (205 lb).Body mass index is 33.09 kg/m.    Have you used any form of tobacco in the last 30 days? (Cigarettes, Smokeless Tobacco, Cigars, and/or Pipes): Yes  Has this patient used any form of tobacco in the last 30 days? (Cigarettes, Smokeless Tobacco, Cigars, and/or Pipes) Yes, N/A  Blood Alcohol level:  Lab Results  Component Value Date   ETH <5 10/17/2015   ETH <5 12/29/2014    Metabolic Disorder Labs:  Lab Results  Component Value Date   HGBA1C 5.8 (H) 07/31/2016   MPG 120 07/31/2016   MPG 134 10/01/2014   Lab Results   Component Value Date   PROLACTIN 25.3 (H) 07/31/2016   Lab Results  Component Value Date   CHOL 189 07/31/2016   TRIG 122 07/31/2016   HDL 47 07/31/2016   CHOLHDL 4.0 07/31/2016   VLDL 24 07/31/2016   LDLCALC 118 (H) 07/31/2016    See Psychiatric Specialty Exam and Suicide Risk Assessment completed by Attending Physician prior to discharge.  Discharge destination:  Home  Is patient on multiple antipsychotic therapies at discharge:  No   Has Patient had three or more failed trials of antipsychotic monotherapy by history:  No  Recommended Plan for Multiple Antipsychotic Therapies: NA   Allergies as of 08/04/2016   No Known Allergies     Medication List    STOP taking these medications   ibuprofen 200 MG tablet Commonly known as:  ADVIL,MOTRIN   tetrahydrozoline 0.05 % ophthalmic solution     TAKE these medications     Indication  ARIPiprazole 5 MG tablet Commonly known as:  ABILIFY Take 1 tablet (5 mg total) by mouth at bedtime.  Indication:  mood stabilization   benztropine 0.5 MG tablet Commonly known as:  COGENTIN Take 1 tablet (0.5 mg total) by mouth at bedtime.  Indication:  Extrapyramidal Reaction caused  by Medications   FLUoxetine 20 MG capsule Commonly known as:  PROZAC Take 1 capsule (20 mg total) by mouth daily. Start taking on:  08/05/2016 What changed:  additional instructions  Indication:  Major Depressive Disorder   hydrOXYzine 25 MG tablet Commonly known as:  ATARAX/VISTARIL Take 1 tablet (25 mg total) by mouth every 6 (six) hours as needed for anxiety. What changed:  medication strength  how much to take  Indication:  Anxiety Neurosis   lisinopril 5 MG tablet Commonly known as:  PRINIVIL,ZESTRIL Take 1 tablet (5 mg total) by mouth daily. Start taking on:  08/05/2016  Indication:  High Blood Pressure Disorder   metFORMIN 500 MG tablet Commonly known as:  GLUCOPHAGE Take 1 tablet (500 mg total) by mouth 2 (two) times daily with a  meal. What changed:  additional instructions  Indication:  Type 2 Diabetes   traZODone 50 MG tablet Commonly known as:  DESYREL Take 1 tablet (50 mg total) by mouth at bedtime as needed for sleep.  Indication:  Trouble Sleeping      Follow-up Information    [facility] Follow up.   Specialty:  Behavioral Health Why:  Go to the walk In clinic between 8 and 11AM, M-F for your hospital follow up appointment  Be sure to go within 7 days of your discharge and bring a photo ID.  [address] [place] [address] [phone]           Follow-up recommendations:  Activity:  as tol Diet:  as tol  Comments:  1.  Take all your medications as prescribed.   2.  Report any adverse side effects to outpatient provider. 3.  Patient instructed to not use alcohol or illegal drugs while on prescription medicines. 4.  In the event of worsening symptoms, instructed patient to call 911, the crisis hotline or go to nearest emergency room for evaluation of symptoms.  Signed: Lindwood Qua, NP [facility] 08/04/2016, 2:31 PM

## 2016-08-04 NOTE — Progress Notes (Signed)
Patient discharged to lobby. Patient was stable and appreciative at that time. All papers, samples and prescriptions were given and valuables returned. Verbal understanding expressed. Denies SI/HI and A/VH. Patient given opportunity to express concerns and ask questions.  

## 2016-08-09 ENCOUNTER — Encounter (HOSPITAL_COMMUNITY): Payer: Self-pay | Admitting: Behavioral Health
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
## 2016-09-22 ENCOUNTER — Emergency Department (HOSPITAL_COMMUNITY)
Admission: EM | Admit: 2016-09-22 | Discharge: 2016-09-23 | Disposition: A | Discharge status: Other Acute Inpatient Hospital | Payer: Self-pay | Attending: Emergency Medicine | Admitting: Emergency Medicine

## 2016-09-22 ENCOUNTER — Encounter (HOSPITAL_COMMUNITY): Payer: Self-pay | Admitting: Emergency Medicine

## 2016-09-22 DIAGNOSIS — Z79899 Other long term (current) drug therapy: Secondary | ICD-10-CM | POA: Insufficient documentation

## 2016-09-22 DIAGNOSIS — R45851 Suicidal ideations: Secondary | ICD-10-CM

## 2016-09-22 DIAGNOSIS — E1140 Type 2 diabetes mellitus with diabetic neuropathy, unspecified: Secondary | ICD-10-CM | POA: Insufficient documentation

## 2016-09-22 DIAGNOSIS — I10 Essential (primary) hypertension: Secondary | ICD-10-CM | POA: Insufficient documentation

## 2016-09-22 DIAGNOSIS — F419 Anxiety disorder, unspecified: Secondary | ICD-10-CM | POA: Insufficient documentation

## 2016-09-22 DIAGNOSIS — F17210 Nicotine dependence, cigarettes, uncomplicated: Secondary | ICD-10-CM | POA: Insufficient documentation

## 2016-09-22 LAB — CBC
HCT: 45.9 % (ref 39.0–52.0)
Hemoglobin: 15.9 g/dL (ref 13.0–17.0)
MCH: 29.7 pg (ref 26.0–34.0)
MCHC: 34.6 g/dL (ref 30.0–36.0)
MCV: 85.6 fL (ref 78.0–100.0)
PLATELETS: 191 10*3/uL (ref 150–400)
RBC: 5.36 MIL/uL (ref 4.22–5.81)
RDW: 13.4 % (ref 11.5–15.5)
WBC: 5.1 10*3/uL (ref 4.0–10.5)

## 2016-09-22 LAB — COMPREHENSIVE METABOLIC PANEL
ALT: 49 U/L (ref 17–63)
AST: 35 U/L (ref 15–41)
Albumin: 4.1 g/dL (ref 3.5–5.0)
Alkaline Phosphatase: 80 U/L (ref 38–126)
Anion gap: 9 (ref 5–15)
BUN: 8 mg/dL (ref 6–20)
CHLORIDE: 105 mmol/L (ref 101–111)
CO2: 26 mmol/L (ref 22–32)
Calcium: 9.4 mg/dL (ref 8.9–10.3)
Creatinine, Ser: 1.12 mg/dL (ref 0.61–1.24)
GFR calc Af Amer: >60 mL/min (ref >60)
GFR calc non Af Amer: >60 mL/min (ref >60)
GLUCOSE: 150 mg/dL — AB (ref 65–99)
POTASSIUM: 4 mmol/L (ref 3.5–5.1)
Sodium: 140 mmol/L (ref 135–145)
Total Bilirubin: 1 mg/dL (ref 0.3–1.2)
Total Protein: 7.1 g/dL (ref 6.5–8.1)

## 2016-09-22 LAB — RAPID URINE DRUG SCREEN, HOSP PERFORMED
AMPHETAMINES: NOT DETECTED
BENZODIAZEPINES: NOT DETECTED
Barbiturates: NOT DETECTED
Cocaine: NOT DETECTED
Opiates: NOT DETECTED
Tetrahydrocannabinol: NOT DETECTED

## 2016-09-22 LAB — SALICYLATE LEVEL: Salicylate Lvl: <7 mg/dL (ref 2.8–30.0)

## 2016-09-22 LAB — ACETAMINOPHEN LEVEL: Acetaminophen (Tylenol), Serum: <10 ug/mL — ABNORMAL LOW (ref 10–30)

## 2016-09-22 LAB — ETHANOL

## 2016-09-22 MED ORDER — METFORMIN HCL 500 MG PO TABS
500.0000 mg | ORAL_TABLET | Freq: Two times a day (BID) | ORAL | Status: DC
  Administered 2016-09-23: 500 mg via ORAL
  Filled 2016-09-22: qty 1

## 2016-09-22 MED ORDER — IBUPROFEN 200 MG PO TABS: 600.0000 mg | ORAL_TABLET | Freq: Three times a day (TID) | ORAL | Status: DC | PRN

## 2016-09-22 MED ORDER — ARIPIPRAZOLE 5 MG PO TABS
5.0000 mg | ORAL_TABLET | Freq: Every day | ORAL | Status: DC
  Administered 2016-09-23: 5 mg via ORAL
  Filled 2016-09-22: qty 1

## 2016-09-22 MED ORDER — LORAZEPAM 1 MG PO TABS: 1.0000 mg | ORAL_TABLET | Freq: Three times a day (TID) | ORAL | Status: DC | PRN

## 2016-09-22 MED ORDER — FLUOXETINE HCL 20 MG PO CAPS: 20.0000 mg | ORAL_CAPSULE | Freq: Every day | ORAL | Status: DC

## 2016-09-22 MED ORDER — ZOLPIDEM TARTRATE 5 MG PO TABS: 5.0000 mg | ORAL_TABLET | Freq: Every evening | ORAL | Status: DC | PRN

## 2016-09-22 MED ORDER — ACETAMINOPHEN 325 MG PO TABS
650.0000 mg | ORAL_TABLET | ORAL | Status: DC | PRN
Start: 2016-09-22 — End: 2016-09-23

## 2016-09-22 MED ORDER — TRAZODONE HCL 50 MG PO TABS: 50.0000 mg | ORAL_TABLET | Freq: Every evening | ORAL | Status: DC | PRN

## 2016-09-22 MED ORDER — HYDROXYZINE HCL 25 MG PO TABS: 25.0000 mg | ORAL_TABLET | Freq: Four times a day (QID) | ORAL | Status: DC | PRN

## 2016-09-22 MED ORDER — LORAZEPAM 1 MG PO TABS
1.0000 mg | ORAL_TABLET | Freq: Once | ORAL | Status: AC
  Administered 2016-09-23: 1 mg via ORAL
  Filled 2016-09-22: qty 1

## 2016-09-22 MED ORDER — LISINOPRIL 2.5 MG PO TABS
5.0000 mg | ORAL_TABLET | Freq: Every day | ORAL | Status: DC
  Filled 2016-09-22: qty 1

## 2016-09-22 MED ORDER — ONDANSETRON HCL 4 MG PO TABS: 4.0000 mg | ORAL_TABLET | Freq: Three times a day (TID) | ORAL | Status: DC | PRN

## 2016-09-22 MED ORDER — ALUM & MAG HYDROXIDE-SIMETH 200-200-20 MG/5ML PO SUSP: 30.0000 mL | ORAL | Status: DC | PRN

## 2016-09-22 MED ORDER — BENZTROPINE MESYLATE 1 MG PO TABS
0.5000 mg | ORAL_TABLET | Freq: Every day | ORAL | Status: DC
  Administered 2016-09-23: 0.5 mg via ORAL
  Filled 2016-09-22: qty 1

## 2016-09-22 MED ORDER — NICOTINE 21 MG/24HR TD PT24: 21.0000 mg | MEDICATED_PATCH | Freq: Every day | TRANSDERMAL | Status: DC

## 2016-09-22 NOTE — ED Triage Notes (Signed)
Patient arrived with [employer] officers reports suicidal ideation - plans to cut his wrist with auditory hallucinations .

## 2016-09-22 NOTE — ED Notes (Signed)
Pt.'s pocket knife given to security for safekeeping .

## 2016-09-22 NOTE — ED Notes (Signed)
Staffing office notified for his sitter , purple scrubs given to pt. Marland Kitchen

## 2016-09-23 ENCOUNTER — Inpatient Hospital Stay (HOSPITAL_COMMUNITY)
Admission: EM | Admit: 2016-09-23 | Discharge: 2016-09-24 | DRG: 885 | Disposition: A | Discharge status: Home / Self Care | Payer: No Typology Code available for payment source | Source: Intra-hospital | Attending: Psychiatry | Admitting: Psychiatry

## 2016-09-23 ENCOUNTER — Encounter (HOSPITAL_COMMUNITY): Payer: Self-pay | Admitting: *Deleted

## 2016-09-23 DIAGNOSIS — Z9114 Patient's other noncompliance with medication regimen: Secondary | ICD-10-CM

## 2016-09-23 DIAGNOSIS — Z9119 Patient's noncompliance with other medical treatment and regimen: Secondary | ICD-10-CM | POA: Diagnosis not present

## 2016-09-23 DIAGNOSIS — F29 Unspecified psychosis not due to a substance or known physiological condition: Secondary | ICD-10-CM

## 2016-09-23 DIAGNOSIS — I10 Essential (primary) hypertension: Secondary | ICD-10-CM | POA: Diagnosis present

## 2016-09-23 DIAGNOSIS — F17210 Nicotine dependence, cigarettes, uncomplicated: Secondary | ICD-10-CM | POA: Diagnosis present

## 2016-09-23 DIAGNOSIS — F411 Generalized anxiety disorder: Secondary | ICD-10-CM | POA: Diagnosis present

## 2016-09-23 DIAGNOSIS — E118 Type 2 diabetes mellitus with unspecified complications: Secondary | ICD-10-CM

## 2016-09-23 DIAGNOSIS — E1140 Type 2 diabetes mellitus with diabetic neuropathy, unspecified: Secondary | ICD-10-CM | POA: Diagnosis present

## 2016-09-23 DIAGNOSIS — F1910 Other psychoactive substance abuse, uncomplicated: Secondary | ICD-10-CM

## 2016-09-23 DIAGNOSIS — R45851 Suicidal ideations: Secondary | ICD-10-CM | POA: Diagnosis present

## 2016-09-23 DIAGNOSIS — F333 Major depressive disorder, recurrent, severe with psychotic symptoms: Secondary | ICD-10-CM | POA: Diagnosis present

## 2016-09-23 DIAGNOSIS — Z818 Family history of other mental and behavioral disorders: Secondary | ICD-10-CM | POA: Diagnosis not present

## 2016-09-23 DIAGNOSIS — F1610 Hallucinogen abuse, uncomplicated: Secondary | ICD-10-CM | POA: Diagnosis present

## 2016-09-23 DIAGNOSIS — Z79899 Other long term (current) drug therapy: Secondary | ICD-10-CM

## 2016-09-23 DIAGNOSIS — R45850 Homicidal ideations: Secondary | ICD-10-CM | POA: Diagnosis not present

## 2016-09-23 DIAGNOSIS — Z560 Unemployment, unspecified: Secondary | ICD-10-CM | POA: Diagnosis not present

## 2016-09-23 DIAGNOSIS — T43226A Underdosing of selective serotonin reuptake inhibitors, initial encounter: Secondary | ICD-10-CM | POA: Diagnosis present

## 2016-09-23 DIAGNOSIS — G4700 Insomnia, unspecified: Secondary | ICD-10-CM | POA: Diagnosis present

## 2016-09-23 DIAGNOSIS — Z638 Other specified problems related to primary support group: Secondary | ICD-10-CM | POA: Diagnosis not present

## 2016-09-23 HISTORY — DX: Anxiety disorder, unspecified: F41.9

## 2016-09-23 LAB — GLUCOSE, CAPILLARY: Glucose-Capillary: 123 mg/dL — ABNORMAL HIGH (ref 65–99)

## 2016-09-23 LAB — CBG MONITORING, ED: GLUCOSE-CAPILLARY: 110 mg/dL — AB (ref 65–99)

## 2016-09-23 MED ORDER — BENZTROPINE MESYLATE 0.5 MG PO TABS
0.5000 mg | ORAL_TABLET | Freq: Every day | ORAL | Status: DC
  Administered 2016-09-23: 0.5 mg via ORAL
  Filled 2016-09-23 (×4): qty 1

## 2016-09-23 MED ORDER — ALUM & MAG HYDROXIDE-SIMETH 200-200-20 MG/5ML PO SUSP: 30.0000 mL | ORAL | Status: DC | PRN

## 2016-09-23 MED ORDER — NICOTINE 21 MG/24HR TD PT24
21.0000 mg | MEDICATED_PATCH | Freq: Every day | TRANSDERMAL | Status: DC
  Filled 2016-09-23 (×5): qty 1

## 2016-09-23 MED ORDER — METFORMIN HCL 500 MG PO TABS
500.0000 mg | ORAL_TABLET | Freq: Two times a day (BID) | ORAL | Status: DC
  Administered 2016-09-23 – 2016-09-24 (×2): 500 mg via ORAL
  Filled 2016-09-23 (×2): qty 14
  Filled 2016-09-23 (×7): qty 1

## 2016-09-23 MED ORDER — TRAZODONE HCL 50 MG PO TABS
50.0000 mg | ORAL_TABLET | Freq: Every evening | ORAL | Status: DC | PRN
  Administered 2016-09-23: 50 mg via ORAL
  Filled 2016-09-23: qty 1
  Filled 2016-09-23: qty 7

## 2016-09-23 MED ORDER — FLUOXETINE HCL 20 MG PO CAPS
20.0000 mg | ORAL_CAPSULE | Freq: Every day | ORAL | Status: DC
  Administered 2016-09-23 – 2016-09-24 (×2): 20 mg via ORAL
  Filled 2016-09-23 (×3): qty 1
  Filled 2016-09-23: qty 7
  Filled 2016-09-23 (×2): qty 1

## 2016-09-23 MED ORDER — HYDROXYZINE HCL 25 MG PO TABS
25.0000 mg | ORAL_TABLET | Freq: Four times a day (QID) | ORAL | Status: DC | PRN
  Administered 2016-09-23 – 2016-09-24 (×2): 25 mg via ORAL
  Filled 2016-09-23: qty 10
  Filled 2016-09-23 (×2): qty 1

## 2016-09-23 MED ORDER — ARIPIPRAZOLE 5 MG PO TABS
5.0000 mg | ORAL_TABLET | Freq: Every day | ORAL | Status: DC
  Administered 2016-09-23 – 2016-09-24 (×2): 5 mg via ORAL
  Filled 2016-09-23 (×2): qty 1
  Filled 2016-09-23: qty 7
  Filled 2016-09-23 (×3): qty 1

## 2016-09-23 MED ORDER — NICOTINE 21 MG/24HR TD PT24
21.0000 mg | MEDICATED_PATCH | Freq: Every day | TRANSDERMAL | Status: DC
  Filled 2016-09-23 (×2): qty 1

## 2016-09-23 MED ORDER — ACETAMINOPHEN 325 MG PO TABS
650.0000 mg | ORAL_TABLET | Freq: Four times a day (QID) | ORAL | Status: DC | PRN
  Administered 2016-09-23 – 2016-09-24 (×2): 650 mg via ORAL
  Filled 2016-09-23 (×2): qty 2

## 2016-09-23 MED ORDER — LISINOPRIL 5 MG PO TABS
5.0000 mg | ORAL_TABLET | Freq: Every day | ORAL | Status: DC
  Administered 2016-09-23 – 2016-09-24 (×2): 5 mg via ORAL
  Filled 2016-09-23: qty 7
  Filled 2016-09-23 (×5): qty 1

## 2016-09-23 MED ORDER — MAGNESIUM HYDROXIDE 400 MG/5ML PO SUSP: 30.0000 mL | Freq: Every day | ORAL | Status: DC | PRN

## 2016-09-23 NOTE — ED Notes (Signed)
Pt belongings catalogued and secured in room. Pt signed medical clearance form and copy made and placed in room.

## 2016-09-23 NOTE — Progress Notes (Signed)
Nursing Progress Note 7p-7a  D) Patient presents flat, depressed and minimal with Clinical research associate. Patient is isolative to room. Patient states "I know I need something for sleep". Patient reports passive SI. Patient endorses nonspecific "voices and shadows" AVH. Patient denies HI and pain. Patient contracts for safety on the unit. Patient did not attend group.  A) Emotional support given. 1:1 interaction and active listening provided. Patient medicated with PM orders as prescribed. Medications reviewed with patient. Patient verbalized understanding of medications without further questions.  Snacks and fluids provided. Opportunities for questions or concerns presented to patient. Patient encouraged to continue to work on treatment goals. Labs, vital signs and patient behavior monitored throughout shift. Patient safety maintained with q15 min safety checks.  R) Patient receptive to interaction with nurse. Patient remains safe on the unit at this time. Patient denies any adverse medication reactions at this time. Patient is resting in bed without complaints. Will continue to monitor.

## 2016-09-23 NOTE — Plan of Care (Signed)
Problem: Education: Goal: Ability to make informed decisions regarding treatment will improve Outcome: Progressing Nurse discussed suicidal thoughts/depression/coping skills with patient.        
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
## 2016-09-23 NOTE — Progress Notes (Signed)
Psychoeducational Group Note  Date:  09/23/2016 Time:  2107  Group Topic/Focus:  Wrap-Up Group:   The focus of this group is to help patients review their daily goal of treatment and discuss progress on daily workbooks.  Participation Level: Did Not Attend  Participation Quality:  Not Applicable  Affect:  Not Applicable  Cognitive:  Not Applicable  Insight:  Not Applicable  Engagement in Group: Not Applicable  Additional Comments: The patient did not attend group this evening.   Hazle Coca S 09/23/2016, 9:07 PM

## 2016-09-23 NOTE — Plan of Care (Signed)
Problem: Safety: Goal: Periods of time without injury will increase Outcome: Progressing Patient is on q15 minute safety checks and contracts for safety on the unit.   
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
## 2016-09-23 NOTE — [facility] Group Notes (Signed)
[facility] Group Notes:  (Counselor/Nursing/MHT/Case Management/Adjunct)  09/23/2016 1:15PM  Type of Therapy:  Group Therapy  Participation Level:  Active  Participation Quality:  Appropriate  Affect:  Flat  Cognitive:  Oriented  Insight:  Improving  Engagement in Group:  Limited  Engagement in Therapy:  Limited  Modes of Intervention:  Discussion, Exploration and Socialization  Summary of Progress/Problems: The topic for group was balance in life.  Pt participated in the discussion about when their life was in balance and out of balance and how this feels.  Pt discussed ways to get back in balance and short term goals they can work on to get where they want to be. "I'm unbalanced emotionally.  I've been going through some stuff."  Unwilling to share anything further.  Stayed the entire time, disengaged.   Daryel Gerald B 09/23/2016 1:22 PM

## 2016-09-23 NOTE — H&P (Signed)
Psychiatric Admission Assessment Adult  Patient Identification: Richard [name]  MRN:  [id number]  Date of Evaluation:  09/23/2016  Chief Complaint: Worsening symptoms of depression triggering suicidal ideations.   Principal Diagnosis: Major depressive disorder, recurrent episodes with psychosis.   Diagnosis:   Patient Active Problem List   Diagnosis Date Noted  . Insomnia [G47.00]   . MDD (major depressive disorder), recurrent, severe, with psychosis ([place]) [F33.3] 07/31/2016  . 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 Tobacco use disorder [F17.200] 07/31/2016  . Diabetes mellitus ([place]) [E11.9] 07/31/2016  . Essential hypertension [I10] 07/31/2016  . Severe episode of recurrent major depressive disorder, without psychotic features ([place]) [F33.2]   . Major depression [F32.9] 10/17/2015  . Major depressive disorder, recurrent, severe without psychotic features ([place]) [F33.2]   . Opiate dependence ([place]) [F11.20] 07/10/2014  . Suicidal ideations [R45.851] 10/27/2013   History of Present Illness: This is one of several admission assessments for this 46 year old AA male with hx of Major depressive disorder, recurrent episodes. He was discharged from this hospital on 08-04-16, 10-20-15 & also in 2015. Richard [name] is known to be non-adherent to his medication regimen. He does not follow-up with his after care psychiatric appointments.  During this admission assessment, Richard [name] reports, "The police took me to the [facility] last night. I called them. A lot of family stuff going on & I'm sick of it. My family is backing off from me, saying that I'm crazy. That kind of name calling ticks me off & makes me go off on them. Then, my mind will start going off the edge thinking like I don't want to be here no more. Then, I will get very angry. I can't deal with this shit no more. I fell like killing people, then myself. I hear voices about people talking about me all the time. I ran out of my medicines too, 3 weeks  ago. When I was taking the medicines, I was doing good. I only took the pills you all gave me last time I was here. I did not see no prescriptions".  Richard [name] denies any hx of previous suicide attempts or harming anyone else.  Associated Signs/Symptoms: Depression Symptoms:  depressed mood, insomnia, hopelessness, suicidal thoughts with specific plan,  (Hypo) Manic Symptoms:  Irritable Mood, Labiality of Mood,  Anxiety Symptoms:  Excessive Worry,  Psychotic Symptoms:  Hallucinations: Auditory hear people talking about me all the time Paranoia,  PTSD Symptoms: Denies any traumatic events or symptoms.  Total Time spent with patient: 1 hour  Past Psychiatric History: Hx of MDD, was admitted to [facility] 10/17/2015- 10/20/2015 & 02-22--18 through 08-04-16. Patient is noncompliant to his medications, denies any past suicide attempts.  Is the patient at risk to self? Yes.    Has the patient been a risk to self in the past 6 months? Yes.    Has the patient been a risk to self within the distant past? Yes.    Is the patient a risk to others? Yes.    Has the patient been a risk to others in the past 6 months? No.  Has the patient been a risk to others within the distant past? Yes.     Prior Inpatient Therapy: Yes ([facility]). Prior Outpatient Therapy: Yes  Alcohol Screening:   Substance Abuse History in the last 12 months:  Yes.  MDMA ([place]) - recent abuse - last use a while ago, he reported. Pt deneis other illicit drug abuse or alcohol abuse  Consequences  of Substance Abuse: Medical Consequences:  Liver damage, Possible death by overdose Legal Consequences:  Arrests, jail time, Loss of driving privilege. Family Consequences:  Family discord, divorce and or separation.  Previous Psychotropic Medications: Yes  - Prozac (noncompliant)  Psychological Evaluations: Yes   Past Medical History:  Past Medical History:  Diagnosis Date  . Depression   . Diabetes mellitus   . Diabetic  neuropathy ([place])   . Hypertension     Past Surgical History:  Procedure Laterality Date  . MOUTH SURGERY    . TYMPANOSTOMY TUBE PLACEMENT     Family History:  Family History  Problem Relation Age of Onset  . Diabetes Mother   . Suicidality Maternal Uncle    Family Psychiatric  History: Maternal uncle committed suicide when patient was just 46 years old, he does not know the details.  Tobacco Screening:    Social History: Patient is separated, has three kids- two adult children and 1 younger child, they are with his wife, he is involved in their care, used to work at [employer], lost his job few months ago due to having anger issues with his Freight forwarder, reports he lives in [place] area, he has a hx of legal issues as a teenager, nothing pending. History  Alcohol Use No    Comment: Patient denies      History  Drug Use  . Frequency: 1.0 time per week  . Types: MDMA ([place])    Comment: Pt endorsed use on 08/08/16-- UDS was not positive    Additional Social History:  Allergies:  No Known Allergies  Lab Results:  Results for orders placed or performed during the hospital encounter of 09/22/16 (from the past 48 hour(s))  Rapid urine drug screen (hospital performed)     Status: None   Collection Time: 09/22/16  7:55 PM  Result Value Ref Range   Opiates NONE DETECTED NONE DETECTED   Cocaine NONE DETECTED NONE DETECTED   Benzodiazepines NONE DETECTED NONE DETECTED   Amphetamines NONE DETECTED NONE DETECTED   Tetrahydrocannabinol NONE DETECTED NONE DETECTED   Barbiturates NONE DETECTED NONE DETECTED    Comment:        DRUG SCREEN FOR MEDICAL PURPOSES ONLY.  IF CONFIRMATION IS NEEDED FOR ANY PURPOSE, NOTIFY LAB WITHIN 5 DAYS.        LOWEST DETECTABLE LIMITS FOR URINE DRUG SCREEN Drug Class       Cutoff (ng/mL) Amphetamine      1000 Barbiturate      200 Benzodiazepine   833 Tricyclics       825 Opiates          300 Cocaine          300 THC              50   Comprehensive  metabolic panel     Status: Abnormal   Collection Time: 09/22/16  8:01 PM  Result Value Ref Range   Sodium 140 135 - 145 mmol/L   Potassium 4.0 3.5 - 5.1 mmol/L   Chloride 105 101 - 111 mmol/L   CO2 26 22 - 32 mmol/L   Glucose, Bld 150 (H) 65 - 99 mg/dL   BUN 8 6 - 20 mg/dL   Creatinine, Ser 1.12 0.61 - 1.24 mg/dL   Calcium 9.4 8.9 - 10.3 mg/dL   Total Protein 7.1 6.5 - 8.1 g/dL   Albumin 4.1 3.5 - 5.0 g/dL   AST 35 15 - 41 U/L   ALT 49 17 -  63 U/L   Alkaline Phosphatase 80 38 - 126 U/L   Total Bilirubin 1.0 0.3 - 1.2 mg/dL   GFR calc non Af Amer >60 >60 mL/min   GFR calc Af Amer >60 >60 mL/min    Comment: (NOTE) The eGFR has been calculated using the CKD EPI equation. This calculation has not been validated in all clinical situations. eGFR's persistently <60 mL/min signify possible Chronic Kidney Disease.    Anion gap 9 5 - 15  Ethanol     Status: None   Collection Time: 09/22/16  8:01 PM  Result Value Ref Range   Alcohol, Ethyl (B) <5 <5 mg/dL    Comment:        LOWEST DETECTABLE LIMIT FOR SERUM ALCOHOL IS 5 mg/dL FOR MEDICAL PURPOSES ONLY   Salicylate level     Status: None   Collection Time: 09/22/16  8:01 PM  Result Value Ref Range   Salicylate Lvl <1.1 2.8 - 30.0 mg/dL  Acetaminophen level     Status: Abnormal   Collection Time: 09/22/16  8:01 PM  Result Value Ref Range   Acetaminophen (Tylenol), Serum <10 (L) 10 - 30 ug/mL    Comment:        THERAPEUTIC CONCENTRATIONS VARY SIGNIFICANTLY. A RANGE OF 10-30 ug/mL MAY BE AN EFFECTIVE CONCENTRATION FOR MANY PATIENTS. HOWEVER, SOME ARE BEST TREATED AT CONCENTRATIONS OUTSIDE THIS RANGE. ACETAMINOPHEN CONCENTRATIONS >150 ug/mL AT 4 HOURS AFTER INGESTION AND >50 ug/mL AT 12 HOURS AFTER INGESTION ARE OFTEN ASSOCIATED WITH TOXIC REACTIONS.   cbc     Status: None   Collection Time: 09/22/16  8:01 PM  Result Value Ref Range   WBC 5.1 4.0 - 10.5 K/uL   RBC 5.36 4.22 - 5.81 MIL/uL   Hemoglobin 15.9 13.0 - 17.0  g/dL   HCT 45.9 39.0 - 52.0 %   MCV 85.6 78.0 - 100.0 fL   MCH 29.7 26.0 - 34.0 pg   MCHC 34.6 30.0 - 36.0 g/dL   RDW 13.4 11.5 - 15.5 %   Platelets 191 150 - 400 K/uL  CBG monitoring, ED     Status: Abnormal   Collection Time: 09/23/16  8:42 AM  Result Value Ref Range   Glucose-Capillary 110 (H) 65 - 99 mg/dL   Blood Alcohol level:  Lab Results  Component Value Date   ETH <5 09/22/2016   ETH <5 94/17/4081   Metabolic Disorder Labs:  Lab Results  Component Value Date   HGBA1C 5.8 (H) 07/31/2016   MPG 120 07/31/2016   MPG 134 10/01/2014   Lab Results  Component Value Date   PROLACTIN 25.3 (H) 07/31/2016   Lab Results  Component Value Date   CHOL 189 07/31/2016   TRIG 122 07/31/2016   HDL 47 07/31/2016   CHOLHDL 4.0 07/31/2016   VLDL 24 07/31/2016   LDLCALC 118 (H) 07/31/2016   Current Medications: No current facility-administered medications for this encounter.    PTA Medications: Prescriptions Prior to Admission  Medication Sig Dispense Refill Last Dose  . benztropine (COGENTIN) 0.5 MG tablet Take 1 tablet (0.5 mg total) by mouth at bedtime. 30 tablet 0 Past Month at Unknown time  . FLUoxetine (PROZAC) 20 MG capsule Take 1 capsule (20 mg total) by mouth daily. 30 capsule 0 Past Month at Unknown time  . hydrOXYzine (ATARAX/VISTARIL) 25 MG tablet Take 1 tablet (25 mg total) by mouth every 6 (six) hours as needed for anxiety. 30 tablet 0 unk  . traZODone (DESYREL) 50 MG  tablet Take 1 tablet (50 mg total) by mouth at bedtime as needed for sleep. 30 tablet 0 unk   Musculoskeletal: Strength & Muscle Tone: within normal limits Gait & Station: normal Patient leans: N/A  Psychiatric Specialty Exam: Physical Exam  Constitutional: He appears well-developed.  HENT:  Head: Normocephalic.  Eyes: Pupils are equal, round, and reactive to light.  Neck: Normal range of motion.  Cardiovascular: Normal rate.   Respiratory: Effort normal.  GI: Soft.  Genitourinary:   Genitourinary Comments: Deferred  Musculoskeletal: Normal range of motion.  Neurological: He is alert.  Skin: Skin is warm and dry.    Review of Systems  Constitutional: Negative.   HENT: Negative.   Eyes: Negative.   Respiratory: Negative.   Cardiovascular: Negative.   Skin: Negative.   Neurological: Negative.   Endo/Heme/Allergies: Negative.   Psychiatric/Behavioral: Positive for depression, hallucinations and suicidal ideas. Negative for memory loss and substance abuse. The patient is nervous/anxious and has insomnia.   All other systems reviewed and are negative.   There were no vitals taken for this visit.There is no height or weight on file to calculate BMI.  General Appearance: Casual and Fairly Groomed  Eye Contact:  Fair  Speech:  Normal Rate  Volume:  Normal  Mood:  Angry, Anxious, Depressed and Irritable  Affect:  Full Range  Thought Process:  Coherent and Descriptions of Associations: Intact  Orientation:  Full (Time, Place, and Person)  Thought Content:  Hallucinations: Auditory, Paranoid Ideation and Rumination  Suicidal Thoughts:  Yes.  with intent/plan  Homicidal Thoughts:  Yes.  without intent/plan, towards his family.  Memory:  Immediate;   Fair Recent;   Fair Remote;   Fair  Judgement:  Impaired  Insight:  Lacking  Psychomotor Activity:  Restlessness  Concentration:  Concentration: Fair and Attention Span: Fair  Recall:  [employer] of Knowledge:  Fair  Language:  Good  Akathisia:  Negative  Handed:  Right  AIMS (if indicated):     Assets:  Communication Skills Desire for Improvement  ADL's:  Intact  Cognition:  WNL  Sleep: Poor   Treatment Plan Summary: Patient presents irritable & angry. Says has not been on his mental health medications in 3 weeks. Reports worsening depression depressed with psychosis.  Says he was stable on the medications he was discharged on in February, only that he ran out of them 3 weeks ago & has not been on medication  since that time. Will re-start his medications he was discharged on the last time he was a patient in the this hospital.  Daily contact with patient to assess and evaluate symptoms and progress in treatment, Medication management and Plan as below Patient will benefit from inpatient treatment and stabilization.   Estimated length of stay is 5-7 days.   Reviewed past medical records,treatment plan.   For Depression: Will resume Prozac 20 mg po daily.  For Psychosis: Will initiate Risperdal 2 mg po qhs.   For insomnia: Trazodone 50 mg po qhs prn .  For substance abuse problem: Pt tends to minimize abuse - will continue to explore. For tobacco use do - offered nicotine patch -   For Diabetes Mellitus: Will re-initiate Metformin 500 mg po bid.  For HTN: Will restart Lisinopril 5 mg po daily.  Will continue to monitor vitals, medication compliance and treatment side effects while patient is here.   Will monitor for medical issues as well as call consult as needed.   Reviewed previous labs done  on 07-31-16: TSH - 2.910 - wnl, Lipid panel- wnl , HgbA1C - 5.8, Prolactin - 25.3.  Patient to participate in therapeutic milieu .   Observation Level/Precautions:  15 minute checks  Labs: Per ED  Psychotherapy:  Individual and group therapy  Medications: See MAR  Consultations: As needed  Discharge Concerns: Mood tability & safety  Estimated LOS: 3-5 days  Other: Admit ti the [facility].   Physician Treatment Plan for Primary Diagnosis: Will initiate medication management for mood stability. Set up an outpatient psychiatric services for medication management. Will encourage medication adherence with psychiatric medications.  Long Term Goal(s): Improvement in symptoms so as ready for discharge  Short Term Goals: Ability to identify changes in lifestyle to reduce recurrence of condition will improve, Ability to verbalize feelings will improve, Ability to disclose and discuss suicidal  ideas and Ability to demonstrate self-control will improve  Physician Treatment Plan for Secondary Diagnosis: Active Problems:   * No active hospital problems. *  Long Term Goal(s): Improvement in symptoms so as ready for discharge  Short Term Goals: Ability to identify and develop effective coping behaviors will improve, Compliance with prescribed medications will improve and Ability to identify triggers associated with substance abuse/mental health issues will improve  I certify that inpatient services furnished can reasonably be expected to improve the patient's condition.    Encarnacion Slates, NP, PMHNP, FNP-BC 4/18/201811:22 AM Patient seen by me and case reviewed with NP Agree with NP assessment  Patient is a 46 year old male, known to our unit from prior admissions .  He was most recently admitted in February 2018. At the time was diagnosed with MDD, with psychotic features, and discharged on Prozac and Abilify.  He has a history of MDMA Abuse . Patient states he called police as he was experiencing depression, suicidal ideations, hallucinations and paranoia. He reported thoughts of cutting self with a knife . States that today " it's getting a little better",and denies any current or active suicidal ideations. Reports he has been experiencing paranoia, self referential ideations, and often feels that people are talking about him behind his back and that his family is saying bad things about him and are working to distance themselves from him.  Patient reports he has not been taking prescribed medications recently and endorses recent MDMA use . Denies alcohol or other drug abuse , and admission UDS is negative, admission BAL is <5.  Dx- MDD with psychotic features, MDMA Use Disorder, consider MDMA induced depression/psychosis  Plan- inpatient admission. Restarted on Prozac 20 mgrs QDAY and on Abilify 5 mgrs QDAY . Will discontinue Cogentin as no EPS and low risk on Abilify.

## 2016-09-23 NOTE — Tx Team (Signed)
Interdisciplinary Treatment and Diagnostic Plan Update  09/23/2016 Time of Session: 3:01 PM  Richard [name] MRN: [id number]  Principal Diagnosis: Major depressive disorder, recurrent episodes with psychosis.   Secondary Diagnoses: Active Problems:   Major depressive disorder, recurrent episode, severe, with psychosis (H[facility])   Current Medications:  Current Facility-Administered Medications  Medication Dose Route Frequency Provider Last Rate Last Dose  . acetaminophen (TYLENOL) tablet 650 mg  650 mg Oral Q6H PRN Sanjuana Kava, NP   650 mg at 09/23/16 1311  . alum & mag hydroxide-simeth (MAALOX/MYLANTA) 200-200-20 MG/5ML suspension 30 mL  30 mL Oral Q4H PRN Sanjuana Kava, NP      . ARIPiprazole (ABILIFY) tablet 5 mg  5 mg Oral Daily Sanjuana Kava, NP   5 mg at 09/23/16 1430  . benztropine (COGENTIN) tablet 0.5 mg  0.5 mg Oral Daily Sanjuana Kava, NP   0.5 mg at 09/23/16 1430  . FLUoxetine (PROZAC) capsule 20 mg  20 mg Oral Daily Sanjuana Kava, NP   20 mg at 09/23/16 1309  . hydrOXYzine (ATARAX/VISTARIL) tablet 25 mg  25 mg Oral Q6H PRN Sanjuana Kava, NP   25 mg at 09/23/16 1312  . lisinopril (PRINIVIL,ZESTRIL) tablet 5 mg  5 mg Oral Daily Sanjuana Kava, NP   5 mg at 09/23/16 1430  . magnesium hydroxide (MILK OF MAGNESIA) suspension 30 mL  30 mL Oral Daily PRN Sanjuana Kava, NP      . metFORMIN (GLUCOPHAGE) tablet 500 mg  500 mg Oral BID WC Sanjuana Kava, NP      . nicotine (NICODERM CQ - dosed in mg/24 hours) patch 21 mg  21 mg Transdermal Daily Rockey Situ Cobos, MD      . traZODone (DESYREL) tablet 50 mg  50 mg Oral QHS PRN Sanjuana Kava, NP        PTA Medications: Prescriptions Prior to Admission  Medication Sig Dispense Refill Last Dose  . benztropine (COGENTIN) 0.5 MG tablet Take 1 tablet (0.5 mg total) by mouth at bedtime. 30 tablet 0 Past Month at Unknown time  . FLUoxetine (PROZAC) 20 MG capsule Take 1 capsule (20 mg total) by mouth daily. 30 capsule 0 Past Month at Unknown time  .  hydrOXYzine (ATARAX/VISTARIL) 25 MG tablet Take 1 tablet (25 mg total) by mouth every 6 (six) hours as needed for anxiety. 30 tablet 0 unk  . traZODone (DESYREL) 50 MG tablet Take 1 tablet (50 mg total) by mouth at bedtime as needed for sleep. 30 tablet 0 unk    Treatment Modalities: Medication Management, Group therapy, Case management,  1 to 1 session with clinician, Psychoeducation, Recreational therapy.   Physician Treatment Plan for Primary Diagnosis: Major depressive disorder, recurrent episodes with psychosis.   Long Term Goal(s): Improvement in symptoms so as ready for discharge  Short Term Goals: Ability to identify and develop effective coping behaviors will improve and Ability to identify triggers associated with substance abuse/mental health issues will improve  Medication Management: Evaluate patient's response, side effects, and tolerance of medication regimen.  Therapeutic Interventions: 1 to 1 sessions, Unit Group sessions and Medication administration.  Evaluation of Outcomes: Adequate for Discharge  Physician Treatment Plan for Secondary Diagnosis: Active Problems:   Major depressive disorder, recurrent episode, severe, with psychosis (HCC)   Long Term Goal(s): Improvement in symptoms so as ready for discharge  Short Term Goals: Compliance with prescribed medications will improve  Medication Management: Evaluate patient's response, side effects, and  tolerance of medication regimen.  Therapeutic Interventions: 1 to 1 sessions, Unit Group sessions and Medication administration.  Evaluation of Outcomes: Adequate for Discharge   RN Treatment Plan for Primary Diagnosis: Major depressive disorder, recurrent episodes with psychosis.   Long Term Goal(s): Knowledge of disease and therapeutic regimen to maintain health will improve  Short Term Goals: Ability to disclose and discuss suicidal ideas, Ability to identify and develop effective coping behaviors will improve  and Compliance with prescribed medications will improve  Medication Management: RN will administer medications as ordered by provider, will assess and evaluate patient's response and provide education to patient for prescribed medication. RN will report any adverse and/or side effects to prescribing provider.  Therapeutic Interventions: 1 on 1 counseling sessions, Psychoeducation, Medication administration, Evaluate responses to treatment, Monitor vital signs and CBGs as ordered, Perform/monitor CIWA, COWS, AIMS and Fall Risk screenings as ordered, Perform wound care treatments as ordered.  Evaluation of Outcomes: Adequate for Discharge   LCSW Treatment Plan for Primary Diagnosis:    Major depressive disorder, recurrent episodes with psychosis.   Long Term Goal(s): Safe transition to appropriate next level of care at discharge, Engage patient in therapeutic group addressing interpersonal concerns.  Short Term Goals: Engage patient in aftercare planning with referrals and resources and Increase skills for wellness and recovery  Therapeutic Interventions: Assess for all discharge needs, 1 to 1 time with Social worker, Explore available resources and support systems, Assess for adequacy in community support network, Educate family and significant other(s) on suicide prevention, Complete Psychosocial Assessment, Interpersonal group therapy.  Evaluation of Outcomes: Adequate for Discharge   Progress in Treatment: Attending groups: Yes Participating in groups: Yes Taking medication as prescribed: Yes, MD continues to assess for medication changes as needed Toleration medication: Yes, no side effects reported at this time Family/Significant other contact made: No, patient reports no support system.  Patient understands diagnosis: Limited insight  Discussing patient identified problems/goals with staff: Yes Medical problems stabilized or resolved: Yes Denies suicidal/homicidal ideation: No   Issues/concerns per patient self-inventory: None Other: N/A  New problem(s) identified: None identified at this time.   New Short Term/Long Term Goal(s): None identified at this time.   Discharge Plan or Barriers: Discharge to a shelter and follow up with [facility] for outpatient psychiatric services.   Reason for Continuation of Hospitalization:  Medication stabilization     Estimated Length of Stay: Likely d/c tomorrow  Attendees: Patient: 09/23/2016  3:01 PM  Physician: Nehemiah Massed 09/23/2016  3:01 PM  Nursing: Amedeo Gory RN  09/23/2016  3:01 PM  RN Care Manager: Onnie Boer 09/23/2016  3:01 PM  Social Worker: Richelle Ito, LCSW 09/23/2016  3:01 PM  Recreational Therapist: Aggie Cosier 09/23/2016  3:01 PM  Other: Baldo Daub, Social Work Intern  09/23/2016  3:01 PM  Other:  09/23/2016  3:01 PM  Other: 09/23/2016  3:01 PM    Scribe for Treatment Team: Richelle Ito LCSW 09/23/2016 3:01 PM
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
## 2016-09-23 NOTE — [facility] Suicide Risk Assessment (Signed)
[facility] INPATIENT:  Family/Significant Other Suicide Prevention Education  Suicide Prevention Education:  Patient Refusal for Family/Significant Other Suicide Prevention Education: The patient Richard [name] has refused to provide written consent for family/significant other to be provided Family/Significant Other Suicide Prevention Education during admission and/or prior to discharge.  Physician notified.   Patient stated that there was no one to contact, and that he was his only support currently.    Baldo Daub 09/23/2016, 2:39 PM

## 2016-09-23 NOTE — ED Notes (Signed)
Pt valuables secured with security.  

## 2016-09-23 NOTE — Tx Team (Signed)
Initial Treatment Plan 09/23/2016 1:52 PM Precious Reel [id number]    PATIENT STRESSORS: Financial difficulties Marital or family conflict Medication change or noncompliance Substance abuse   PATIENT STRENGTHS: Ability for insight Average or above average intelligence Capable of independent living Communication skills General fund of knowledge Motivation for treatment/growth Supportive family/friends   PATIENT IDENTIFIED PROBLEMS: "depression"  "anxiety"  "panic attacks"  "suicidal thoughts"  "substance abuse"             DISCHARGE CRITERIA:  Ability to meet basic life and health needs Adequate post-discharge living arrangements Improved stabilization in mood, thinking, and/or behavior Medical problems require only outpatient monitoring Motivation to continue treatment in a less acute level of care Need for constant or close observation no longer present Reduction of life-threatening or endangering symptoms to within safe limits Safe-care adequate arrangements made Verbal commitment to aftercare and medication compliance Withdrawal symptoms are absent or subacute and managed without 24-hour nursing intervention  PRELIMINARY DISCHARGE PLAN: Attend aftercare/continuing care group Attend PHP/IOP Attend 12-step recovery group Outpatient therapy Participate in family therapy Return to previous living arrangement  PATIENT/FAMILY INVOLVEMENT: This treatment plan has been presented to and reviewed with the patient, Gareld Obrecht. The patient and family have been given the opportunity to ask questions and make suggestions.  Quintella Reichert [place], RN 09/23/2016, 1:52 PM

## 2016-09-23 NOTE — ED Provider Notes (Signed)
[facility]-EMERGENCY DEPT Provider Note   CSN: [id number] Arrival date & time: 09/22/16  1957     History   Chief Complaint Chief Complaint  Patient presents with  . Suicidal    HPI Richard [name] is a 46 y.o. male.  The history is provided by the patient and medical records.    46 y.o. M with hx of depression, DM, neuropathy, HTN, presenting to the ED for suicidal ideation.  Patient states this has been ongoing for a few days now.  States his family has stopped speaking to him which makes him feel like he has lost everything.  States he no longer has a relationship with his wife or his children which is devastating to him.  States he feels like he no longer has a reason to live and he would rather "not be here".  Patient admits that he has thought of killing himself multiple times. He had a knife with him and planned to cut himself but he "chickened out".  States he got to the point where he just wanted cops to stab him.  States he just wants to "not be here" anymore.  States he has been hearing things.  States when he sees people having a conversation he has voices in his head that tells him "they are talking about you" and he gets very angry about this.  States he does get a little paranoid over this as well because he wants to know what is being said about him.  States he is worried he will get so upset that he may end up hurting someone.  Patient denies any homicidal ideation. He denies any visual hallucinations. He does report taking mildly recently, but denies any significant alcohol abuse. He has been prescribed medications for depression and anxiety in the past, however he is not taking them. States while he was on them he did not notice any significant change in his symptoms.  Past Medical History:  Diagnosis Date  . Depression   . Diabetes mellitus   . Diabetic neuropathy (HCC)   . Hypertension     Patient Active Problem List   Diagnosis Date Noted  . Insomnia   . MDD (major  depressive disorder), recurrent, severe, with psychosis (HCC) 07/31/2016  . 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 Tobacco use disorder 07/31/2016  . Diabetes mellitus (HCC) 07/31/2016  . Essential hypertension 07/31/2016  . Severe episode of recurrent major depressive disorder, without psychotic features (HCC)   . Major depression 10/17/2015  . Major depressive disorder, recurrent, severe without psychotic features (HCC)   . Opiate dependence (HCC) 07/10/2014  . Suicidal ideations 10/27/2013    Past Surgical History:  Procedure Laterality Date  . MOUTH SURGERY    . TYMPANOSTOMY TUBE PLACEMENT         Home Medications    Prior to Admission medications   Medication Sig Start Date End Date Taking? Authorizing Provider  benztropine (COGENTIN) 0.5 MG tablet Take 1 tablet (0.5 mg total) by mouth at bedtime. 08/04/16  Yes Adonis Brook, NP  FLUoxetine (PROZAC) 20 MG capsule Take 1 capsule (20 mg total) by mouth daily. 08/05/16  Yes Adonis Brook, NP  hydrOXYzine (ATARAX/VISTARIL) 25 MG tablet Take 1 tablet (25 mg total) by mouth every 6 (six) hours as needed for anxiety. 08/04/16  Yes Adonis Brook, NP  traZODone (DESYREL) 50 MG tablet Take 1 tablet (50 mg total) by mouth at bedtime as needed for sleep. 08/04/16  Yes Adonis Brook, NP    Family History  Family History  Problem Relation Age of Onset  . Diabetes Mother   . Suicidality Maternal Uncle     Social History Social History  Substance Use Topics  . Smoking status: Current Every Day Smoker    Packs/day: 1.00    Types: Cigarettes  . Smokeless tobacco: Never Used  . Alcohol use No     Comment: Patient denies      Allergies   Patient has no known allergies.   Review of Systems Review of Systems  Psychiatric/Behavioral: Positive for hallucinations and suicidal ideas.  All other systems reviewed and are negative.    Physical Exam Updated Vital Signs BP (!) 128/91 (BP Location: Right Arm)   Pulse (!) 56   Temp 97.4 F  (36.3 C) (Oral)   Resp 18   Ht  (1.753 m)   Wt 99.8 kg   SpO2 100%   BMI 32.50 kg/m   Physical Exam  Constitutional: He is oriented to person, place, and time. He appears well-developed and well-nourished.  Pacing around room during exam  HENT:  Head: Normocephalic and atraumatic.  Mouth/Throat: Oropharynx is clear and moist.  Eyes: Conjunctivae and EOM are normal. Pupils are equal, round, and reactive to light.  Neck: Normal range of motion.  Cardiovascular: Normal rate, regular rhythm and normal heart sounds.   Pulmonary/Chest: Effort normal and breath sounds normal.  Abdominal: Soft. Bowel sounds are normal.  Musculoskeletal: Normal range of motion.  Neurological: He is alert and oriented to person, place, and time.  Skin: Skin is warm and dry.  Psychiatric: His mood appears anxious. His speech is rapid and/or pressured. He is hyperactive.  Appears anxious and hyperactive during exam, speech is somewhat pressured Admits to SI with plan to stab himself with knife, denies active HI; admits to hearing voices during exam, no visual hallucinations noted  Nursing note and vitals reviewed.    ED Treatments / Results  Labs (all labs ordered are listed, but only abnormal results are displayed) Labs Reviewed  COMPREHENSIVE METABOLIC PANEL - Abnormal; Notable for the following:       Result Value   Glucose, Bld 150 (*)    All other components within normal limits  ACETAMINOPHEN LEVEL - Abnormal; Notable for the following:    Acetaminophen (Tylenol), Serum <10 (*)    All other components within normal limits  ETHANOL  SALICYLATE LEVEL  CBC  RAPID URINE DRUG SCREEN, HOSP PERFORMED    EKG  EKG Interpretation None       Radiology No results found.  Procedures Procedures (including critical care time)  Medications Ordered in ED Medications  alum & mag hydroxide-simeth (MAALOX/MYLANTA) 200-200-20 MG/5ML suspension 30 mL (not administered)  ondansetron (ZOFRAN)  tablet 4 mg (not administered)  nicotine (NICODERM CQ - dosed in mg/24 hours) patch 21 mg (not administered)  zolpidem (AMBIEN) tablet 5 mg (not administered)  ibuprofen (ADVIL,MOTRIN) tablet 600 mg (not administered)  acetaminophen (TYLENOL) tablet 650 mg (not administered)  LORazepam (ATIVAN) tablet 1 mg (not administered)  ARIPiprazole (ABILIFY) tablet 5 mg (5 mg Oral Given 09/23/16 0004)  benztropine (COGENTIN) tablet 0.5 mg (0.5 mg Oral Given 09/23/16 0003)  FLUoxetine (PROZAC) capsule 20 mg (not administered)  hydrOXYzine (ATARAX/VISTARIL) tablet 25 mg (not administered)  lisinopril (PRINIVIL,ZESTRIL) tablet 5 mg (not administered)  metFORMIN (GLUCOPHAGE) tablet 500 mg (not administered)  traZODone (DESYREL) tablet 50 mg (not administered)  LORazepam (ATIVAN) tablet 1 mg (1 mg Oral Given 09/23/16 0004)     Initial Impression /  Assessment and Plan / ED Course  I have reviewed the triage vital signs and the nursing notes.  Pertinent labs & imaging results that were available during my care of the patient were reviewed by me and considered in my medical decision making (see chart for details).  47 year old male here with suicidal ideation. Also reports some auditory hallucinations. Patient does appear anxious and hyperactive on exam. His speech is somewhat pressured and he is pacing around the room. Continues to admit to suicidal ideation with plan to stab himself with knife. This was confiscated by the police. Denies any homicidal ideation at this time. Screening lab work was obtained and is overall reassuring. Will plan for TTS evaluation.  TTS has evaluated patient and recommends inpatient placement. I agree with this assessment. They will seek placement. Holding orders in place. Home meds have been ordered as well.  Final Clinical Impressions(s) / ED Diagnoses   Final diagnoses:  Suicidal ideation    New Prescriptions New Prescriptions   No medications on file     Garlon Hatchet, PA-C 09/23/16 1610    Derwood Kaplan, MD 09/24/16 1415

## 2016-09-24 LAB — GLUCOSE, CAPILLARY: Glucose-Capillary: 129 mg/dL — ABNORMAL HIGH (ref 65–99)

## 2016-09-24 MED ORDER — FLUOXETINE HCL 20 MG PO CAPS: 20.0000 mg | ORAL_CAPSULE | Freq: Every day | ORAL | 0 refills | Status: DC

## 2016-09-24 MED ORDER — TRAZODONE HCL 50 MG PO TABS: 50.0000 mg | ORAL_TABLET | Freq: Every evening | ORAL | 0 refills | Status: DC | PRN

## 2016-09-24 MED ORDER — METFORMIN HCL 500 MG PO TABS: 500.0000 mg | ORAL_TABLET | Freq: Two times a day (BID) | ORAL | 0 refills | Status: DC

## 2016-09-24 MED ORDER — LISINOPRIL 5 MG PO TABS: 5.0000 mg | ORAL_TABLET | Freq: Every day | ORAL | 0 refills | Status: DC

## 2016-09-24 MED ORDER — ARIPIPRAZOLE 5 MG PO TABS: 5.0000 mg | ORAL_TABLET | Freq: Every day | ORAL | 0 refills | Status: DC

## 2016-09-24 MED ORDER — HYDROXYZINE HCL 25 MG PO TABS: 25.0000 mg | ORAL_TABLET | Freq: Four times a day (QID) | ORAL | 0 refills | Status: DC | PRN

## 2016-09-24 MED ORDER — NICOTINE 21 MG/24HR TD PT24: 21.0000 mg | MEDICATED_PATCH | Freq: Every day | TRANSDERMAL | 0 refills | Status: DC

## 2016-09-24 NOTE — Progress Notes (Signed)
Recreation Therapy Notes  Patient admitted to unit 09/23/16. Due to admission within last year, no new assessment conducted at this time. Last assessment conducted 07/31/16. Patient reports new stressor from previous admission. Patient stated he felt his family was leaving him alone and didn't want to be bothered with him.    Patient denies SI, HI, AVH at this time. Patient kept saying "I'm good" and was focused on going home.  Patient reports goal of going home at this admission.   Information found below from assessment conducted 07/31/16.   Coping Skills:  Isolate, Arguments, Substance Abuse, Avoidance, Self-Injury, Talking, Music, Sports  Personal Challenges:  Anger, Communication, Concentration, Decision-Making, Expressing Yourself, Problem-Solving, Relationships, Self-Esteem/Confidence, Social Interaction, Stress Management, Substance Abuse, Time Management  Leisure Interests: Education administrator, Individual- TV   Richard [name], LRT/CTRS       Caroll Rancher A 09/24/2016 12:28 PM

## 2016-09-24 NOTE — [facility] Suicide Risk Assessment (Signed)
[facility] Discharge Suicide Risk Assessment   Principal Problem: Major depressive disorder, recurrent episode, severe, with psychosis (HCC) Discharge Diagnoses:  Patient Active Problem List   Diagnosis Date Noted  . Major depressive disorder, recurrent episode, severe, with psychosis (HCC) [F33.3] 09/23/2016  . Insomnia [G47.00]   . MDD (major depressive disorder), recurrent, severe, with psychosis (HCC) [F33.3] 07/31/2016  . 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 Tobacco use disorder [F17.200] 07/31/2016  . Diabetes mellitus (HCC) [E11.9] 07/31/2016  . Essential hypertension [I10] 07/31/2016  . Severe episode of recurrent major depressive disorder, without psychotic features (HCC) [F33.2]   . Major depression [F32.9] 10/17/2015  . Major depressive disorder, recurrent, severe without psychotic features (HCC) [F33.2]   . Opiate dependence (HCC) [F11.20] 07/10/2014  . Suicidal ideations [R45.851] 10/27/2013    Total Time spent with patient: 30 minutes  Musculoskeletal: Strength & Muscle Tone: within normal limits Gait & Station: normal Patient leans: N/A  Psychiatric Specialty Exam: ROS denies any headache, no shortness of breath, no vomiting no rash, no fever  Blood pressure (!) 109/58, pulse 87, temperature 97.9 F (36.6 C), temperature source Oral, resp. rate 18, height  (1.727 m), weight 98.9 kg (218 lb), SpO2 100 %.Body mass index is 33.15 kg/m.  General Appearance: Well Groomed  Eye Contact::  Good  Speech:  Normal Rate409  Volume:  Normal  Mood:  denies depression, states " my mood is pretty good now"  Affect:  Appropriate and reactive   Thought Process:  Linear and Descriptions of Associations: Intact  Orientation:  Full (Time, Place, and Person)  Thought Content:  no hallucinations, no delusions,not internally preoccupied   Suicidal Thoughts:  No denies any suicidal or self injurious ideations, also denies any homicidal or violent plans or intentions, specifically denies  any homicidal ideations towards family.   Homicidal Thoughts:  No  Memory:  recent and remote grossly intact   Judgement:  Fair  Insight:  Fair  Psychomotor Activity:  Normal  Concentration:  Good  Recall:  Good  Fund of Knowledge:Good  Language: Good  Akathisia:  Negative  Handed:  Right  AIMS (if indicated):     Assets:  Communication Skills Desire for Improvement Resilience  Sleep:  Number of Hours: 5.5  Cognition: WNL  ADL's:  Intact   Mental Status Per Nursing Assessment::   On Admission:  Suicidal ideation indicated by patient  Demographic Factors:  46 year old male, has three children, unemployed, has applied for disability  Loss Factors: Unstable home situation   Historical Factors: History of prior psychiatric admissions, history of MDMA abuse, history of substance induced mood /pyschosis.  Risk Reduction Factors:   Sense of responsibility to family and Positive coping skills or problem solving skills  Continued Clinical Symptoms:  At this time patient is alert, attentive, calm, mood is described as much improved and denies any depression today, presents euthymic with a full range of affect, no thought disorder, no suicidal or self injurious ideations, no homicidal or violent plans or intentions, no delusions expressed . Patient expressing desire for discharge- plans to go to shelter system. Patient states " honestly I lied when I went to the ER. ". Explains he had just returned from [place], and had nowhere to stay initially, " other than [place] ". States he therefore decided to seek admission to secure a bed, meals, and " get some rest". States he has not recently used MDMA nor had psychotic symptoms, although  he does endorse past symptoms of paranoia and self referential ideations in the context of the above . Denies medication side effects.   Cognitive Features That Contribute To Risk:  Closed-mindedness and Loss of executive  function    Suicide Risk:  Mild:  Suicidal ideation of limited frequency, intensity, duration, and specificity.  There are no identifiable plans, no associated intent, mild dysphoria and related symptoms, good self-control (both objective and subjective assessment), few other risk factors, and identifiable protective factors, including available and accessible social support.    Plan Of Care/Follow-up recommendations:  Activity:  as tolerated  Diet:  diabetic diet  Tests:  NA Other:  See below  Patient is insisting on discharge today- states he needs to secure a shelter bed, and proceed with some errands such as changing his address back from [place] to [place] " right now my mail is going to [place]".  I have discussed case with RN, CSW, other staff members- it is agreed that at this time there are no grounds for involuntary commitment . Patient leaving unit in good spirits . Referred to outpatient care .   Craige Cotta, MD 09/24/2016, 1:34 PM

## 2016-09-24 NOTE — Progress Notes (Signed)
Recreation Therapy Notes  Date: 09/24/16 Time: 1000 Location: 300 Hall Dayroom  Group Topic: Wellness  Goal Area(s) Addresses:  Patient will define components of whole wellness. Patient will verbalize benefit of whole wellness.  Behavioral Response: None  Intervention:  Chairs, Giant beach ball  Activity: Keep It Going Volleyball.  Patients were placed in a circle.  Patients were to pass the beach ball back and forth to each other.  The ball could bounce off the ground but could not come to a complete stop.  LRT would keep count of how many times the ball was hit.  If the ball comes to a stop, the count starts over.  Education: Wellness, Building control surveyor.   Education Outcome: Acknowledges education/In group clarification offered/Needs additional education.   Clinical Observations/Feedback: Pt sat quietly and watched his peers participate in group.  Pt left early and did not return.   Caroll Rancher, LRT/CTRS         Lillia Abed, Nikia Levels A 09/24/2016 11:50 AM

## 2016-09-24 NOTE — Progress Notes (Signed)
  [facility] Adult Case Management Discharge Plan :  Will you be returning to the same living situation after discharge:  Yes,  wandering the streets [place] At discharge, do you have transportation home?: Yes,  bus pass Do you have the ability to pay for your medications: Yes,  mental health  Release of information consent forms completed and in the chart;  Patient's signature needed at discharge.  Patient to Follow up at: Follow-up Information    [facility] up.   Specialty:  Behavioral Health Why:  Go to the walk-in clinc with-in 5 days of d/c, M-F between 8 and 11AM for your hospital follow up appointment Contact information: [address] [place] [address] [phone]           Next level of care provider has access to [facility] Link:no  Safety Planning and Suicide Prevention discussed: Yes,  yes  Have you used any form of tobacco in the last 30 days? (Cigarettes, Smokeless Tobacco, Cigars, and/or Pipes): Yes  Has patient been referred to the Quitline?: Patient refused referral  Patient has been referred for addiction treatment: Pt. refused referral  Ida Rogue 09/24/2016, 2:07 PM

## 2016-09-24 NOTE — Discharge Summary (Signed)
Physician Discharge Summary Note  Patient:  Richard [name] is an 46 y.o., male MRN:  [id number] DOB:  December 13, 1970 Patient phone:  [phone] (home)  Patient address:   [address] [place] [address],  Total Time spent with patient: Greater than 30 minutes  Date of Admission:  09/23/2016  Date of Discharge: 09-24-16  Reason for Admission: Worsening symptoms of depression triggering suicidal ideations.  Discharge Diagnoses: Principal Problem:   Major depressive disorder, recurrent episode, severe, with psychosis [facility])  Psychiatric Specialty Exam: Physical Exam  Constitutional: He is oriented to person, place, and time. He appears well-developed and well-nourished.  HENT:  Head: Normocephalic.  Eyes: Pupils are equal, round, and reactive to light.  Neck: Normal range of motion.  Cardiovascular: Normal rate.   Respiratory: Effort normal.  GI: Soft.  Genitourinary:  Genitourinary Comments: Denies any issues in this area  Musculoskeletal: Normal range of motion.  Neurological: He is alert and oriented to person, place, and time.  Skin: Skin is warm and dry.  Psychiatric: His speech is normal and behavior is normal. Judgment normal. His mood appears not anxious. His affect is not angry, not blunt, not labile and not inappropriate. He is not actively hallucinating. Thought content is not paranoid and not delusional. Cognition and memory are normal. He does not exhibit a depressed mood. He expresses no homicidal and no suicidal ideation.    Review of Systems  Constitutional: Negative.   HENT: Negative.   Eyes: Negative.   Respiratory: Negative.   Cardiovascular: Negative.   Gastrointestinal: Negative.   Genitourinary: Negative.   Musculoskeletal: Negative.   Skin: Negative.   Neurological: Negative.   Endo/Heme/Allergies: Negative.   Psychiatric/Behavioral: Positive for depression (Stabilized with medication prior to discharge) and substance abuse (Hx. opioid dependence   ). Negative for hallucinations, memory loss and suicidal ideas. The patient has insomnia (Stabilized with medication prior to dicharge). The patient is not nervous/anxious.     Blood pressure (!) 109/58, pulse 87, temperature 97.9 F (36.6 C), temperature source Oral, resp. rate 18, height  (1.727 m), weight 98.9 kg (218 lb), SpO2 100 %.Body mass index is 33.15 kg/m.   See Md's SRA   Musculoskeletal:  Strength & Muscle Tone: within normal limits  Gait & Station: normal  Patient leans: N/A  Depressive Disorders:  MDD (major depressive disorder)  Past Medical History:  Diagnosis Date  . Anxiety   . Depression   . Diabetes mellitus   . Diabetic neuropathy (HCC)   . Hypertension    Level of Care:  OP  Hospital Course: This is one of several admission assessments for this 46 year old AA male with hx of Major depressive disorder, recurrent episodes. He was discharged from this hospital on 08-04-16, 10-20-15 & also in 2015. Abdelrahman is known to be non-adherent to his medication regimen. He does not follow-up with his after care psychiatric appointments.  During this admission assessment, Shadee reports, "The police took me to the [facility] last night. I called them. A lot of family stuff going on & I'm sick of it. My family is backing off from me, saying that I'm crazy. That kind of name calling ticks me off & makes me go off on them. Then, my mind will start going off the edge thinking like I don't want to be here no more. Then, I will get very angry. I can't deal with this shit no more. I fell like killing people, then myself. I hear  voices about people talking about me all the time. I ran out of my medicines too, 3 weeks ago. When I was taking the medicines, I was doing good. I only took the pills you all gave me last time I was here. I did not see no prescriptions". Woody denies any hx of previous suicide attempts or harming anyone else.   Martavius was recently discharged from this  hospital after receiving mental health care. He was discharged with discharge medication samples, prescriptions for his discharge medications & a referral/appointment for an outpatient psychiatric follow-up care & medication management. He was re-admitted to the hospital for worsening symptoms of depression & suicidal ideations. He cited not getting along with his family members as the trigger. He was here for mood re-stabilization treatments.   After his admission assessment, Josip was restarted on the medication medication regimen for mood stabilization. He received; Abilify 5 mg for mood control, Fluoxetine 20 mg for depression, Hydroxyzine 25 mg prn for anxiety, Nicotine patch 21 mg for smoking cessation & Trazodone 50 mg for insomnia. His other pre-existing medical problems were identified & treated by resuming his pertinent home medications for those health issues.   During a routine follow-up care assessment this am, Kareen asked to be discharge. He says he is feeling better & does not longer need to be in the hospital. He says he is willing to continue mental health care on an outpatient basis. At this time, Isais is alert, attentive, calm, mood is described as much improved and denies any depression today, presents euthymic with a full range of affect, no thought disorder, no suicidal or self injurious ideations, no homicidal or violent plans or intentions, no delusions expressed . Jayvier is expressing the desire for discharge- plans to go to shelter system. He states "honestly, I lied when I went to the ER". He explains that he had just returned from [place] & had nowhere to stay initially, " other than with a brother who lives out in the [place] ". He states he therefore decided to seek admission to secure a bed, meals, and " get some rest". States he has not recently used MDMA nor had psychotic symptoms, although he does endorse past symptoms of paranoia and self referential ideations in the context of the  above. Denies medication side effects.   Upon discharge, Juddson was both mentally and medically stable. He denies suicidal/homicidal ideations, auditory/visual/tactile hallucinations, delusional thoughts or paranoia. He received from the pharmacy, a 7 days worth, supply samples of her [facility] discharge medications. He left [facility] with all belongings in no distress. Transportation per city bus. [facility] assisted with bus pass.  Consults:  psychiatry  Discharge Vitals:   Blood pressure (!) 109/58, pulse 87, temperature 97.9 F (36.6 C), temperature source Oral, resp. rate 18, height  (1.727 m), weight 98.9 kg (218 lb), SpO2 100 %. Body mass index is 33.15 kg/m.  Lab Results:   Results for orders placed or performed during the hospital encounter of 09/23/16 (from the past 72 hour(s))  Glucose, capillary     Status: Abnormal   Collection Time: 09/23/16  5:28 PM  Result Value Ref Range   Glucose-Capillary 123 (H) 65 - 99 mg/dL  Glucose, capillary     Status: Abnormal   Collection Time: 09/24/16  6:24 AM  Result Value Ref Range   Glucose-Capillary 129 (H) 65 - 99 mg/dL   Physical Findings: AIMS: Facial and Oral Movements Muscles of Facial Expression: None, normal Lips and Perioral Area:  None, normal Jaw: None, normal Tongue: None, normal,Extremity Movements Upper (arms, wrists, hands, fingers): None, normal Lower (legs, knees, ankles, toes): None, normal, Trunk Movements Neck, shoulders, hips: None, normal, Overall Severity Severity of abnormal movements (highest score from questions above): None, normal Incapacitation due to abnormal movements: None, normal Patient's awareness of abnormal movements (rate only patient's report): No Awareness, Dental Status Current problems with teeth and/or dentures?: No Does patient usually wear dentures?: No  CIWA:  CIWA-Ar Total: 4 COWS:  COWS Total Score: 2  Psychiatric Specialty Exam: See Psychiatric Specialty Exam and Suicide Risk Assessment  completed by Attending Physician prior to discharge.  Discharge destination:  Home  Is patient on multiple antipsychotic therapies at discharge:  No   Has Patient had three or more failed trials of antipsychotic monotherapy by history:  No  Recommended Plan for Multiple Antipsychotic Therapies: NA  Allergies as of 09/24/2016   No Known Allergies     Medication List    STOP taking these medications   benztropine 0.5 MG tablet Commonly known as:  COGENTIN     TAKE these medications     Indication  ARIPiprazole 5 MG tablet Commonly known as:  ABILIFY Take 1 tablet (5 mg total) by mouth daily. For mood control Start taking on:  09/25/2016  Indication:  Mood control   FLUoxetine 20 MG capsule Commonly known as:  PROZAC Take 1 capsule (20 mg total) by mouth daily. For depression Start taking on:  09/25/2016 What changed:  additional instructions  Indication:  Major Depressive Disorder   hydrOXYzine 25 MG tablet Commonly known as:  ATARAX/VISTARIL Take 1 tablet (25 mg total) by mouth every 6 (six) hours as needed for anxiety.  Indication:  Anxiety Neurosis   lisinopril 5 MG tablet Commonly known as:  PRINIVIL,ZESTRIL Take 1 tablet (5 mg total) by mouth daily. For high blood pressure Start taking on:  09/25/2016  Indication:  High Blood Pressure Disorder   metFORMIN 500 MG tablet Commonly known as:  GLUCOPHAGE Take 1 tablet (500 mg total) by mouth 2 (two) times daily with a meal. For diabetes management  Indication:  Type 2 Diabetes   nicotine 21 mg/24hr patch Commonly known as:  NICODERM CQ - dosed in mg/24 hours Place 1 patch (21 mg total) onto the skin daily. For smoking cessation Start taking on:  09/25/2016  Indication:  Nicotine Addiction   traZODone 50 MG tablet Commonly known as:  DESYREL Take 1 tablet (50 mg total) by mouth at bedtime as needed for sleep.  Indication:  Trouble Sleeping      Follow-up Information    [facility] Follow up.   Specialty:   Behavioral Health Why:  Go to the walk-in clinc with-in 5 days of d/c, M-F between 8 and 11AM for your hospital follow up appointment Contact information: [address] [place] [address] [phone]          Follow-up recommendations: Activity:  As tolerated Diet: As recommended by your primary care doctor. Keep all scheduled follow-up appointments as recommended.  Comments: Patient is instructed prior to discharge to: Take all medications as prescribed by his/her mental healthcare provider. Report any adverse effects and or reactions from the medicines to his/her outpatient provider promptly. Patient has been instructed & cautioned: To not engage in alcohol and or illegal drug use while on prescription medicines. In the event of worsening symptoms, patient is instructed to call the crisis hotline, 911 and or go to the nearest ED for appropriate evaluation  and treatment of symptoms. To follow-up with his/her primary care provider for your other medical issues, concerns and or health care needs.   Signed: Sanjuana Kava, PMHNP, FNP-BC  Patient seen, Suicide Assessment Completed.  Disposition Plan Reviewed

## 2016-09-27 ENCOUNTER — Encounter (HOSPITAL_COMMUNITY): Payer: Self-pay | Admitting: Emergency Medicine

## 2016-09-27 ENCOUNTER — Emergency Department (HOSPITAL_COMMUNITY)
Admission: EM | Admit: 2016-09-27 | Discharge: 2016-09-27 | Disposition: A | Discharge status: Home / Self Care | Payer: Self-pay | Attending: Emergency Medicine | Admitting: Emergency Medicine

## 2016-09-27 DIAGNOSIS — Z7984 Long term (current) use of oral hypoglycemic drugs: Secondary | ICD-10-CM | POA: Insufficient documentation

## 2016-09-27 DIAGNOSIS — I10 Essential (primary) hypertension: Secondary | ICD-10-CM | POA: Insufficient documentation

## 2016-09-27 DIAGNOSIS — K029 Dental caries, unspecified: Secondary | ICD-10-CM | POA: Insufficient documentation

## 2016-09-27 DIAGNOSIS — K0889 Other specified disorders of teeth and supporting structures: Secondary | ICD-10-CM | POA: Insufficient documentation

## 2016-09-27 DIAGNOSIS — F329 Major depressive disorder, single episode, unspecified: Secondary | ICD-10-CM | POA: Insufficient documentation

## 2016-09-27 DIAGNOSIS — E1140 Type 2 diabetes mellitus with diabetic neuropathy, unspecified: Secondary | ICD-10-CM | POA: Insufficient documentation

## 2016-09-27 DIAGNOSIS — F17210 Nicotine dependence, cigarettes, uncomplicated: Secondary | ICD-10-CM | POA: Insufficient documentation

## 2016-09-27 DIAGNOSIS — Z5321 Procedure and treatment not carried out due to patient leaving prior to being seen by health care provider: Secondary | ICD-10-CM | POA: Insufficient documentation

## 2016-09-27 LAB — RAPID URINE DRUG SCREEN, HOSP PERFORMED
Amphetamines: NOT DETECTED
BARBITURATES: NOT DETECTED
Benzodiazepines: NOT DETECTED
Cocaine: NOT DETECTED
OPIATES: NOT DETECTED
TETRAHYDROCANNABINOL: NOT DETECTED

## 2016-09-27 MED ORDER — OXYCODONE-ACETAMINOPHEN 5-325 MG PO TABS
1.0000 | ORAL_TABLET | Freq: Once | ORAL | Status: AC
  Administered 2016-09-27: 1 via ORAL
  Filled 2016-09-27: qty 1

## 2016-09-27 MED ORDER — IBUPROFEN 600 MG PO TABS: 600.0000 mg | ORAL_TABLET | Freq: Four times a day (QID) | ORAL | 0 refills | Status: DC | PRN

## 2016-09-27 MED ORDER — OXYCODONE-ACETAMINOPHEN 5-325 MG PO TABS: 1.0000 | ORAL_TABLET | ORAL | 0 refills | Status: DC | PRN

## 2016-09-27 MED ORDER — PENICILLIN V POTASSIUM 500 MG PO TABS: 500.0000 mg | ORAL_TABLET | Freq: Two times a day (BID) | ORAL | 0 refills | Status: AC

## 2016-09-27 NOTE — ED Triage Notes (Signed)
Per [facility] patient comes from friend's house for SI. Patient wife died couple years ago and patient been battling with SI since.  Patient wanting to be able to talk with someone out patient 2-3 times per week and not having to take a bunch of medications.  CBG 104, BP 140/94, HR 86,

## 2016-09-27 NOTE — ED Notes (Signed)
Pt refused blood work, Risk manager,

## 2016-09-27 NOTE — ED Triage Notes (Signed)
Onset one day ago developed left lower dental pain after eating chips. Airway intact bilateral equal chest rise and fall.

## 2016-09-27 NOTE — ED Notes (Signed)
Pt out to nurses station inquiring about what he is waiting on, discussed eval with provider " I am just voluntary, I mean I can just leave, I don't have to wait on anybody"

## 2016-09-27 NOTE — ED Notes (Signed)
In to speak with pt to request that he change into scrubs "I am not changing scrubs, I told the ems people, the officer and the nurse before and I told them I am not changing into those things. I only want to talk to the doctor and get some referrals, that's it." Explained to pt policy at this time, he needs to change into scrubs at this time. Pt stated "if I have to change into those to see the doctor I will just leave"

## 2016-09-27 NOTE — ED Notes (Signed)
Dr. Clarene Duke notified that pt stating that he wants to leave

## 2016-09-27 NOTE — Discharge Instructions (Signed)
Take medications as prescribed. You may also take 600 mg ibuprofen 4 times daily as needed for pain relief. I recommend eating prior to taking ibuprofen to prevent gastrointestinal side effects. You may apply ice to affected area for 15-20 minutes 3-4 times daily to help with pain. °Follow-up with one of the dental clinics listed below for further management of your dental pain. °Return to the emergency department if symptoms worsen or new onset of fever, headache, neck stiffness, facial/neck swelling, unable to open jaw, unable to swallow resulting in drooling, difficulty breathing, drainage, unable to tolerate fluids.  ° °[employer] °[employer] °[facility] °[address] °[place], [place] [address] °Phone [phone] ° °[facility] [employer] [facility] [place], [place], exemplifies the Dental School?s vision to improve the health and quality of life of all [place]ians by creating leaders with a passion to care for the underserved and by leading the nation in community-based, service learning oral health education. ° °We are committed to offering comprehensive general dental services for adults, children and special needs patients in a safe, caring and professional setting. ° ° °Appointments: Our clinic is open Monday through Friday 8:00 a.m. until 5:00 p.m. The amount of time scheduled for an appointment depends on the patient?s specific needs. We ask that you keep your appointed time for care or provide 24-hour notice of all appointment changes. Parents or legal guardians must accompany minor children. °  °Payment for Services: Medicaid and other insurance plans are welcome. Payment for services is due when services are rendered and may be made by cash or credit card. If you have dental insurance, we will assist you with your claim submission. °   °Emergencies:   Emergency services will be provided Monday through Friday on a walk-in basis.  Please arrive early for emergency services. After hours emergency services will be provided for patients of record as required. °  °Services:  °Comprehensive General Dentistry °Children?s Dentistry °Oral Surgery - Extractions °Root Canals °Sealants and Tooth Colored Fillings °Crowns and Bridges °Dentures and Partial Dentures °Implant Services °Periodontal Services and Cleanings °Cosmetic Tooth Whitening °Digital Radiography °3-D/[facility] Imaging  °
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
## 2016-09-27 NOTE — ED Notes (Signed)
Dr. Clarene Duke notified pt refusing blood draw/dress out into scrubs and stating he may leave if he has to do so.

## 2016-09-27 NOTE — ED Provider Notes (Signed)
[facility]-EMERGENCY DEPT Provider Note   CSN: [id number] Arrival date & time: 09/27/16  0847     History   Chief Complaint Chief Complaint  Patient presents with  . Dental Pain    HPI Rondel Episcopo is a 46 y.o. male.  HPI  Past Medical History:  Diagnosis Date  . Anxiety   . Depression   . Diabetes mellitus   . Diabetic neuropathy (HCC)   . Hypertension     Patient Active Problem List   Diagnosis Date Noted  . Major depressive disorder, recurrent episode, severe, with psychosis (HCC) 09/23/2016  . Insomnia   . MDD (major depressive disorder), recurrent, severe, with psychosis (HCC) 07/31/2016  . 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 Tobacco use disorder 07/31/2016  . Diabetes mellitus (HCC) 07/31/2016  . Essential hypertension 07/31/2016  . Severe episode of recurrent major depressive disorder, without psychotic features (HCC)   . Major depression 10/17/2015  . Major depressive disorder, recurrent, severe without psychotic features (HCC)   . Opiate dependence (HCC) 07/10/2014  . Suicidal ideations 10/27/2013    Past Surgical History:  Procedure Laterality Date  . MOUTH SURGERY    . TYMPANOSTOMY TUBE PLACEMENT         Home Medications    Prior to Admission medications   Medication Sig Start Date End Date Taking? Authorizing Provider  ARIPiprazole (ABILIFY) 5 MG tablet Take 1 tablet (5 mg total) by mouth daily. For mood control 09/25/16   Sanjuana Kava, NP  FLUoxetine (PROZAC) 20 MG capsule Take 1 capsule (20 mg total) by mouth daily. For depression 09/25/16   Sanjuana Kava, NP  hydrOXYzine (ATARAX/VISTARIL) 25 MG tablet Take 1 tablet (25 mg total) by mouth every 6 (six) hours as needed for anxiety. 09/24/16   Sanjuana Kava, NP  ibuprofen (ADVIL,MOTRIN) 600 MG tablet Take 1 tablet (600 mg total) by mouth every 6 (six) hours as needed. 09/27/16   Barrett Henle, PA-C  lisinopril (PRINIVIL,ZESTRIL) 5 MG tablet Take 1 tablet (5 mg total) by mouth daily. For high blood  pressure 09/25/16   Sanjuana Kava, NP  metFORMIN (GLUCOPHAGE) 500 MG tablet Take 1 tablet (500 mg total) by mouth 2 (two) times daily with a meal. For diabetes management 09/24/16   Sanjuana Kava, NP  nicotine (NICODERM CQ - DOSED IN MG/24 HOURS) 21 mg/24hr patch Place 1 patch (21 mg total) onto the skin daily. For smoking cessation 09/25/16   Sanjuana Kava, NP  oxyCODONE-acetaminophen (PERCOCET/ROXICET) 5-325 MG tablet Take 1 tablet by mouth every 4 (four) hours as needed for severe pain. 09/27/16   Barrett Henle, PA-C  penicillin v potassium (VEETID) 500 MG tablet Take 1 tablet (500 mg total) by mouth 2 (two) times daily. 09/27/16 10/04/16  Barrett Henle, PA-C  traZODone (DESYREL) 50 MG tablet Take 1 tablet (50 mg total) by mouth at bedtime as needed for sleep. 09/24/16   Sanjuana Kava, NP    Family History Family History  Problem Relation Age of Onset  . Diabetes Mother   . Suicidality Maternal Uncle     Social History Social History  Substance Use Topics  . Smoking status: Current Every Day Smoker    Packs/day: 1.00    Years: 15.00    Types: Cigarettes  . Smokeless tobacco: Never Used  . Alcohol use No     Comment: Patient denies      Allergies   Patient has no known allergies.   Review of Systems  Review of Systems  Constitutional: Negative for fever.  HENT: Negative for facial swelling and trouble swallowing.   Respiratory: Negative for shortness of breath.   Neurological: Negative for headaches.     Physical Exam Updated Vital Signs BP 118/89 (BP Location: Left Arm)   Pulse 96   Temp 97.6 F (36.4 C) (Oral)   Resp 16   Ht  (1.727 m)   Wt 99.8 kg   SpO2 100%   BMI 33.45 kg/m   Physical Exam  Constitutional: He is oriented to person, place, and time. He appears well-developed and well-nourished.  HENT:  Head: Normocephalic and atraumatic.  Mouth/Throat: Uvula is midline, oropharynx is clear and moist and mucous membranes are normal. No  oral lesions. No trismus in the jaw. Abnormal dentition. Dental caries present. No dental abscesses or uvula swelling. No oropharyngeal exudate, posterior oropharyngeal edema, posterior oropharyngeal erythema or tonsillar abscesses. No tonsillar exudate.    No trismus, drooling, facial/neck swelling or stridor on exam. No muffled voice. Floor of mouth soft.    Eyes: Conjunctivae and EOM are normal. Right eye exhibits no discharge. Left eye exhibits no discharge. No scleral icterus.  Neck: Normal range of motion. Neck supple.  Cardiovascular: Normal rate.   Pulmonary/Chest: Effort normal.  Musculoskeletal: Normal range of motion.  Neurological: He is alert and oriented to person, place, and time.  Nursing note and vitals reviewed.    ED Treatments / Results  Labs (all labs ordered are listed, but only abnormal results are displayed) Labs Reviewed - No data to display  EKG  EKG Interpretation None       Radiology No results found.  Procedures Procedures (including critical care time)  Medications Ordered in ED Medications  oxyCODONE-acetaminophen (PERCOCET/ROXICET) 5-325 MG per tablet 1 tablet (not administered)     Initial Impression / Assessment and Plan / ED Course  I have reviewed the triage vital signs and the nursing notes.  Pertinent labs & imaging results that were available during my care of the patient were reviewed by me and considered in my medical decision making (see chart for details).     Patient with toothache.  No gross abscess.  Exam unconcerning for Ludwig's angina or spread of infection.  Will treat with penicillin and pain medicine.  Urged patient to follow-up with dentist.     Final Clinical Impressions(s) / ED Diagnoses   Final diagnoses:  Pain, dental    New Prescriptions New Prescriptions   IBUPROFEN (ADVIL,MOTRIN) 600 MG TABLET    Take 1 tablet (600 mg total) by mouth every 6 (six) hours as needed.   OXYCODONE-ACETAMINOPHEN  (PERCOCET/ROXICET) 5-325 MG TABLET    Take 1 tablet by mouth every 4 (four) hours as needed for severe pain.   PENICILLIN V POTASSIUM (VEETID) 500 MG TABLET    Take 1 tablet (500 mg total) by mouth 2 (two) times daily.     Satira Sark [place], [place] 09/27/16 1610    Gerhard Munch, MD 09/28/16 [phone]

## 2016-09-27 NOTE — ED Notes (Signed)
Pt resting in chair at this time, awaiting provider eval

## 2016-09-27 NOTE — ED Notes (Signed)
Dr. Clarene Duke came to talk the the pt and he is no where to be found

## 2016-09-27 NOTE — ED Notes (Signed)
Phelbotomy states that pt refused blood draw.

## 2016-09-29 MED FILL — hydrOXYzine HCL 25 MG TABS: 25 | 20 days supply | Qty: 60 | Fill #0

## 2016-09-29 MED FILL — metFORMIN HCL 500 MG TABS: 500 | 30 days supply | Qty: 60 | Fill #0

## 2016-09-29 MED FILL — PENICILLIN VK 500 MG TABLET: 500 | 7 days supply | Qty: 14 | Fill #0

## 2016-09-29 MED FILL — ARIPiprazole 5 MG TABS: 5 | 30 days supply | Qty: 30 | Fill #0

## 2016-09-29 MED FILL — traZODone HCL 50 MG TABS: 50 | 30 days supply | Qty: 30 | Fill #0

## 2016-09-29 MED FILL — FLUoxetine HCL 20 MG CAPS: 20 | 30 days supply | Qty: 30 | Fill #0

## 2016-09-29 MED FILL — LISINOPRIL 5 MG TABLET: 5 | 30 days supply | Qty: 30 | Fill #0

## 2016-10-07 NOTE — Congregational Nurse Program (Signed)
Congregational Nurse Program Note  Date of Encounter: 09/28/2016  Past Medical History: Past Medical History:  Diagnosis Date  . Anxiety   . Depression   . Diabetes mellitus   . Diabetic neuropathy (HCC)   . Hypertension     Encounter Details:     CNP Questionnaire - 09/28/16 1610      Patient Demographics   Is this a new or existing patient? New   Patient is considered a/an Not Applicable   Race African-American/Black     Patient Assistance   Location of Patient Assistance Not Applicable   Patient's financial/insurance status Low Income;Self-Pay (Uninsured)   Uninsured Patient ([place] [employer]) Yes   Interventions Counseled to make appt. with provider;Assisted patient in making appt.;Appt. has been completed   Patient referred to apply for the following financial assistance [employer] insecurities addressed Not Applicable   Transportation assistance No   Assistance securing medications Yes   Type of Assistance [facility]   Educational health offerings Chronic disease;Navigating the healthcare system     Encounter Details   Primary purpose of visit Chronic Illness/Condition Visit;Navigating the Healthcare System;Post ED/Hospitalization Visit   Was an Emergency Department visit averted? Not Applicable   Does patient have a medical provider? No   Patient referred to Clinic   Was a mental health screening completed? (GAINS tool) No   Does patient have dental issues? No   Does patient have vision issues? No   Does your patient have an abnormal blood pressure today? No   Since previous encounter, have you referred patient for abnormal blood pressure that resulted in a new diagnosis or medication change? No   Does your patient have an abnormal blood glucose today? No   Since previous encounter, have you referred patient for abnormal blood glucose that resulted in a new diagnosis or medication change? No   Was there a life-saving  intervention made? No     Recent discharge from [facility].  Needed assistance with obtaining dc meds.  Referred to [facility] for follow up.

## 2020-03-25 ENCOUNTER — Ambulatory Visit (HOSPITAL_COMMUNITY)
Admission: EM | Admit: 2020-03-25 | Discharge: 2020-03-26 | Disposition: A | Discharge status: Home / Self Care | Payer: Self-pay | Attending: Nurse Practitioner | Admitting: Nurse Practitioner

## 2020-03-25 ENCOUNTER — Other Ambulatory Visit: Payer: Self-pay

## 2020-03-25 DIAGNOSIS — Z7901 Long term (current) use of anticoagulants: Secondary | ICD-10-CM | POA: Insufficient documentation

## 2020-03-25 DIAGNOSIS — R454 Irritability and anger: Secondary | ICD-10-CM | POA: Insufficient documentation

## 2020-03-25 DIAGNOSIS — I10 Essential (primary) hypertension: Secondary | ICD-10-CM | POA: Insufficient documentation

## 2020-03-25 DIAGNOSIS — F17210 Nicotine dependence, cigarettes, uncomplicated: Secondary | ICD-10-CM | POA: Insufficient documentation

## 2020-03-25 DIAGNOSIS — Z79899 Other long term (current) drug therapy: Secondary | ICD-10-CM | POA: Insufficient documentation

## 2020-03-25 DIAGNOSIS — Z20822 Contact with and (suspected) exposure to covid-19: Secondary | ICD-10-CM | POA: Insufficient documentation

## 2020-03-25 DIAGNOSIS — R45851 Suicidal ideations: Secondary | ICD-10-CM | POA: Insufficient documentation

## 2020-03-25 DIAGNOSIS — Z7984 Long term (current) use of oral hypoglycemic drugs: Secondary | ICD-10-CM | POA: Insufficient documentation

## 2020-03-25 DIAGNOSIS — F333 Major depressive disorder, recurrent, severe with psychotic symptoms: Secondary | ICD-10-CM | POA: Insufficient documentation

## 2020-03-25 DIAGNOSIS — E1140 Type 2 diabetes mellitus with diabetic neuropathy, unspecified: Secondary | ICD-10-CM | POA: Insufficient documentation

## 2020-03-25 DIAGNOSIS — Z7289 Other problems related to lifestyle: Secondary | ICD-10-CM | POA: Insufficient documentation

## 2020-03-25 DIAGNOSIS — F1910 Other psychoactive substance abuse, uncomplicated: Secondary | ICD-10-CM | POA: Insufficient documentation

## 2020-03-25 MED ORDER — ARIPIPRAZOLE 5 MG PO TABS
5.0000 mg | ORAL_TABLET | Freq: Every day | ORAL | Status: DC
  Administered 2020-03-26 (×2): 5 mg via ORAL
  Filled 2020-03-25: qty 7
  Filled 2020-03-25 (×2): qty 1

## 2020-03-25 MED ORDER — MAGNESIUM HYDROXIDE 400 MG/5ML PO SUSP
30.0000 mL | Freq: Every day | ORAL | Status: DC | PRN
Start: 2020-03-25 — End: 2020-03-26

## 2020-03-25 MED ORDER — FLUOXETINE HCL 20 MG PO CAPS
20.0000 mg | ORAL_CAPSULE | Freq: Every day | ORAL | Status: DC
  Administered 2020-03-26: 20 mg via ORAL
  Filled 2020-03-25: qty 7
  Filled 2020-03-25: qty 1

## 2020-03-25 MED ORDER — NICOTINE 21 MG/24HR TD PT24
21.0000 mg | MEDICATED_PATCH | Freq: Every day | TRANSDERMAL | Status: DC
  Filled 2020-03-25: qty 1

## 2020-03-25 MED ORDER — LISINOPRIL 5 MG PO TABS
5.0000 mg | ORAL_TABLET | Freq: Every day | ORAL | Status: DC
  Administered 2020-03-26: 5 mg via ORAL
  Filled 2020-03-25: qty 1
  Filled 2020-03-25: qty 7

## 2020-03-25 MED ORDER — ALUM & MAG HYDROXIDE-SIMETH 200-200-20 MG/5ML PO SUSP: 30.0000 mL | ORAL | Status: DC | PRN

## 2020-03-25 MED ORDER — TRAZODONE HCL 50 MG PO TABS
50.0000 mg | ORAL_TABLET | Freq: Every evening | ORAL | Status: DC | PRN
Start: 2020-03-25 — End: 2020-03-26
  Filled 2020-03-25: qty 7

## 2020-03-25 MED ORDER — HYDROXYZINE HCL 25 MG PO TABS
25.0000 mg | ORAL_TABLET | Freq: Three times a day (TID) | ORAL | Status: DC | PRN
  Filled 2020-03-25: qty 10

## 2020-03-25 MED ORDER — METFORMIN HCL 500 MG PO TABS
500.0000 mg | ORAL_TABLET | Freq: Two times a day (BID) | ORAL | Status: DC
  Administered 2020-03-26: 500 mg via ORAL
  Filled 2020-03-25: qty 14
  Filled 2020-03-25: qty 1

## 2020-03-25 MED ORDER — FLUOXETINE HCL 10 MG PO CAPS
10.0000 mg | ORAL_CAPSULE | Freq: Once | ORAL | Status: AC
  Administered 2020-03-26: 10 mg via ORAL
  Filled 2020-03-25: qty 1

## 2020-03-25 MED ORDER — ACETAMINOPHEN 325 MG PO TABS
650.0000 mg | ORAL_TABLET | Freq: Four times a day (QID) | ORAL | Status: DC | PRN
Start: 2020-03-25 — End: 2020-03-26

## 2020-03-25 NOTE — ED Provider Notes (Signed)
Behavioral Health Admission [facility])  Date: 03/26/20 Patient Name: Richard [name] MRN: [id number] Chief Complaint:  Chief Complaint  Patient presents with  . Suicidal   Chief Complaint/Presenting Problem: Pt states he is trying to return home to [place], [place] after a visit with his children and foster mother in [place]. Pt states he cannot return home b/c his car will not start.  Diagnoses:  Final diagnoses:  Severe recurrent major depressive disorder with psychotic features (HCC)    HPI: Richard [name] is a 49 y.o. with a history of MDD with psychotic features who presents voluntarily with [facility] to [facility] due to worsening depression. Patient reports that he came to [facility] about a week ago to come visit his children and foster mother. He states that he now remembers why he moved away from [place]. He states that he wants to return home to [place] but he missed a car payment and the car company turned off his ignition. He states that he does not have the money to make the payment so he is not able to leave [employer]. On evaluation, patient is alert and oriented x 4. He is irritable, but cooperative. His speech is clear and coherent, decreased in volume. Patient reports that he is having suicidal thoughts and states several possible plans, such as wrecking his car, but no specific plan. He reports that he has a history of two suicide attemtps--once by attempting to get hit by a car and once by attempting to jump off of a bridge. He reports auditory hallucinations, but would not further discuss. He denies visual hallucinations. He does not appear to responding to internal stimuli. He denies substance abuse. Patient has a history of several inpatient psychiatric admissions. Patient reports that he was last inpatient in 2018 when he was admitted to [facility]. He states that he is not taking any medication. States that he has not consistently taken medication in the past. He states that he has HTN  and DM but has not been taking those medications either. Patient was discharged from [facility] in 09/2016 on  fluoxetine 20 mg daily and abilify 5 mg daily  PHQ 2-9:     ED from 03/25/2020 in [facility]  C-SSRS RISK CATEGORY Low Risk       Total Time spent with patient: 30 minutes  Musculoskeletal  Strength & Muscle Tone: within normal limits Gait & Station: normal Patient leans: N/A  Psychiatric Specialty Exam  Presentation General Appearance: Appropriate for Environment;Neat  Eye Contact:Fleeting  Speech:Clear and Coherent;Normal Rate  Speech Volume:Decreased  Handedness:Right   Mood and Affect  Mood:Anxious;Depressed;Hopeless;Worthless;Irritable  Affect:Congruent;Depressed   Thought Process  Thought Processes:Coherent;Linear  Descriptions of Associations:Intact  Orientation:Full (Time, Place and Person)  Thought Content:Logical  Hallucinations:Hallucinations: Auditory;Visual  Ideas of Reference:None  Suicidal Thoughts:Suicidal Thoughts: Yes, Active SI Active Intent and/or Plan: With Intent;With Plan;With Means to Carry Out  Homicidal Thoughts:Homicidal Thoughts: No   Sensorium  Memory:Recent Fair;Remote Fair;Immediate Good  Judgment:Impaired  Insight:Lacking   Executive Functions  Concentration:Fair  Attention Span:Fair  Recall:Fair  Fund of Knowledge:Good  Language:Good   Psychomotor Activity  Psychomotor Activity:Psychomotor Activity: Normal   Assets  Assets:Communication Skills;Desire for Improvement;Physical Health;Resilience   Sleep  Sleep:Sleep: Fair   Physical Exam Constitutional:      General: He is not in acute distress.    Appearance: He is not ill-appearing, toxic-appearing or diaphoretic.  HENT:     Head: Normocephalic.     Right Ear: External ear normal.  Left Ear: External ear normal.  Eyes:     Pupils: Pupils are equal, round, and reactive to light.  Cardiovascular:     Rate and  Rhythm: Normal rate.  Pulmonary:     Effort: Pulmonary effort is normal. No respiratory distress.  Musculoskeletal:        General: Normal range of motion.  Skin:    General: Skin is warm and dry.  Neurological:     Mental Status: He is alert and oriented to person, place, and time.  Psychiatric:        Mood and Affect: Mood is anxious and depressed.        Speech: Speech normal.        Behavior: Behavior is cooperative.        Thought Content: Thought content is not paranoid or delusional. Thought content includes suicidal ideation. Thought content does not include homicidal ideation. Thought content includes suicidal plan.    Review of Systems  Constitutional: Negative for chills, diaphoresis, fever, malaise/fatigue and weight loss.  HENT: Negative for congestion.   Respiratory: Negative for cough and shortness of breath.   Cardiovascular: Negative for chest pain and palpitations.  Gastrointestinal: Negative for diarrhea, nausea and vomiting.  Neurological: Negative for dizziness and seizures.  Psychiatric/Behavioral: Positive for depression, hallucinations and suicidal ideas. Negative for memory loss and substance abuse. The patient is nervous/anxious and has insomnia.   All other systems reviewed and are negative.   Blood pressure 125/80, pulse 68, temperature 98.6 F (37 C), resp. rate 18, SpO2 100 %. There is no height or weight on file to calculate BMI.  Past Psychiatric History: MDD  Is the patient at risk to self? Yes  Has the patient been a risk to self in the past 6 months? No .    Has the patient been a risk to self within the distant past? Yes   Is the patient a risk to others? No   Has the patient been a risk to others in the past 6 months? No   Has the patient been a risk to others within the distant past? No   Past Medical History:  Past Medical History:  Diagnosis Date  . Anxiety   . Depression   . Diabetes mellitus   . Diabetic neuropathy (HCC)   .  Hypertension     Past Surgical History:  Procedure Laterality Date  . MOUTH SURGERY    . TYMPANOSTOMY TUBE PLACEMENT      Family History:  Family History  Problem Relation Age of Onset  . Diabetes Mother   . Suicidality Maternal Uncle     Social History:  Social History   Socioeconomic History  . Marital status: Single    Spouse name: Not on file  . Number of children: Not on file  . Years of education: Not on file  . Highest education level: Not on file  Occupational History  . Not on file  Tobacco Use  . Smoking status: Current Every Day Smoker    Packs/day: 1.00    Years: 15.00    Pack years: 15.00    Types: Cigarettes  . Smokeless tobacco: Never Used  Substance and Sexual Activity  . Alcohol use: No    Comment: Patient denies   . Drug use: Yes    Frequency: 1.0 times per week    Types: MDMA (Ecstacy)    Comment: Pt endorsed use on 08/08/16-- UDS was not positive  . Sexual activity: Never  Other Topics  Concern  . Not on file  Social History Narrative  . Not on file   Social Determinants of Health   Financial Resource Strain:   . Difficulty of Paying Living Expenses: Not on file  Food Insecurity:   . Worried About Programme researcher, broadcasting/film/video in the Last Year: Not on file  . Ran Out of Food in the Last Year: Not on file  Transportation Needs:   . Lack of Transportation (Medical): Not on file  . Lack of Transportation (Non-Medical): Not on file  Physical Activity:   . Days of Exercise per Week: Not on file  . Minutes of Exercise per Session: Not on file  Stress:   . Feeling of Stress : Not on file  Social Connections:   . Frequency of Communication with Friends and Family: Not on file  . Frequency of Social Gatherings with Friends and Family: Not on file  . Attends Religious Services: Not on file  . Active Member of Clubs or Organizations: Not on file  . Attends Banker Meetings: Not on file  . Marital Status: Not on file  Intimate Partner  Violence:   . Fear of Current or Ex-Partner: Not on file  . Emotionally Abused: Not on file  . Physically Abused: Not on file  . Sexually Abused: Not on file    SDOH:  SDOH Screenings   Alcohol Screen:   . Last Alcohol Screening Score (AUDIT): Not on file  Depression (PHQ2-9):   . PHQ-2 Score: Not on file  Financial Resource Strain:   . Difficulty of Paying Living Expenses: Not on file  Food Insecurity:   . Worried About Programme researcher, broadcasting/film/video in the Last Year: Not on file  . Ran Out of Food in the Last Year: Not on file  Housing:   . Last Housing Risk Score: Not on file  Physical Activity:   . Days of Exercise per Week: Not on file  . Minutes of Exercise per Session: Not on file  Social Connections:   . Frequency of Communication with Friends and Family: Not on file  . Frequency of Social Gatherings with Friends and Family: Not on file  . Attends Religious Services: Not on file  . Active Member of Clubs or Organizations: Not on file  . Attends Banker Meetings: Not on file  . Marital Status: Not on file  Stress:   . Feeling of Stress : Not on file  Tobacco Use: High Risk  . Smoking Tobacco Use: Current Every Day Smoker  . Smokeless Tobacco Use: Never Used  Transportation Needs:   . Freight forwarder (Medical): Not on file  . Lack of Transportation (Non-Medical): Not on file    Last Labs:  Admission on 03/25/2020  Component Date Value Ref Range Status  . SARS Coronavirus 2 by RT PCR 03/26/2020 NEGATIVE  NEGATIVE Final   Comment: (NOTE) SARS-CoV-2 target nucleic acids are NOT DETECTED.  The SARS-CoV-2 RNA is generally detectable in upper respiratoy specimens during the acute phase of infection. The lowest concentration of SARS-CoV-2 viral copies this assay can detect is 131 copies/mL. A negative result does not preclude SARS-Cov-2 infection and should not be used as the sole basis for treatment or other patient management decisions. A negative result  may occur with  improper specimen collection/handling, submission of specimen other than nasopharyngeal swab, presence of viral mutation(s) within the areas targeted by this assay, and inadequate number of viral copies (<131 copies/mL). A negative result  must be combined with clinical observations, patient history, and epidemiological information. The expected result is Negative.  Fact Sheet for Patients:  https://www.moore.com/  Fact Sheet for Healthcare Providers:  https://www.young.biz/  This test is no                          t yet approved or cleared by the [place] FDA and  has been authorized for detection and/or diagnosis of SARS-CoV-2 by FDA under an Emergency Use Authorization (EUA). This EUA will remain  in effect (meaning this test can be used) for the duration of the COVID-19 declaration under Section 564(b)(1) of the Act, 21 U.S.C. section 360bbb-3(b)(1), unless the authorization is terminated or revoked sooner.    . Influenza A by PCR 03/26/2020 NEGATIVE  NEGATIVE Final  . Influenza B by PCR 03/26/2020 NEGATIVE  NEGATIVE Final   Comment: (NOTE) The Xpert Xpress SARS-CoV-2/FLU/RSV assay is intended as an aid in  the diagnosis of influenza from Nasopharyngeal swab specimens and  should not be used as a sole basis for treatment. Nasal washings and  aspirates are unacceptable for Xpert Xpress SARS-CoV-2/FLU/RSV  testing.  Fact Sheet for Patients: https://www.moore.com/  Fact Sheet for Healthcare Providers: https://www.young.biz/  This test is not yet approved or cleared by the [place] FDA and  has been authorized for detection and/or diagnosis of SARS-CoV-2 by  FDA under an Emergency Use Authorization (EUA). This EUA will remain  in effect (meaning this test can be used) for the duration of the  Covid-19 declaration under Section 564(b)(1) of the Act, 21  U.S.C. section  360bbb-3(b)(1), unless the authorization is  terminated or revoked. Performed at [facility], [address], [place], [place] [address]   . SARS Coronavirus 2 Ag 03/25/2020 Negative  Negative Preliminary  . WBC 03/26/2020 6.3  4.0 - 10.5 K/uL Final  . RBC 03/26/2020 5.58  4.22 - 5.81 MIL/uL Final  . Hemoglobin 03/26/2020 16.0  13.0 - 17.0 g/dL Final  . HCT 35/57/3220 47.7  39 - 52 % Final  . MCV 03/26/2020 85.5  80.0 - 100.0 fL Final  . MCH 03/26/2020 28.7  26.0 - 34.0 pg Final  . MCHC 03/26/2020 33.5  30.0 - 36.0 g/dL Final  . RDW 25/42/7062 13.1  11.5 - 15.5 % Final  . Platelets 03/26/2020 209  150 - 400 K/uL Final  . nRBC 03/26/2020 0.0  0.0 - 0.2 % Final  . Neutrophils Relative % 03/26/2020 41  % Final  . Neutro Abs 03/26/2020 2.6  1.7 - 7.7 K/uL Final  . Lymphocytes Relative 03/26/2020 49  % Final  . Lymphs Abs 03/26/2020 3.1  0.7 - 4.0 K/uL Final  . Monocytes Relative 03/26/2020 6  % Final  . Monocytes Absolute 03/26/2020 0.4  0.1 - 1.0 K/uL Final  . Eosinophils Relative 03/26/2020 3  % Final  . Eosinophils Absolute 03/26/2020 0.2  0.0 - 0.5 K/uL Final  . Basophils Relative 03/26/2020 1  % Final  . Basophils Absolute 03/26/2020 0.0  0.0 - 0.1 K/uL Final  . Immature Granulocytes 03/26/2020 0  % Final  . Abs Immature Granulocytes 03/26/2020 0.02  0.00 - 0.07 K/uL Final   Performed at [facility], [address] [place], [place] [address]  . Sodium 03/26/2020 137  135 - 145 mmol/L Final  . Potassium 03/26/2020 3.6  3.5 - 5.1 mmol/L Final  . Chloride 03/26/2020 103  98 - 111 mmol/L Final  .  CO2 03/26/2020 22  22 - 32 mmol/L Final  . Glucose, Bld 03/26/2020 160* 70 - 99 mg/dL Final   Glucose reference range applies only to samples taken after fasting for at least 8 hours.  . BUN 03/26/2020 10  6 - 20 mg/dL Final  . Creatinine, Ser 03/26/2020 0.91  0.61 - 1.24 mg/dL Final  . Calcium 40/98/119110/19/2021 9.7  8.9 - 10.3 mg/dL Final  . Total Protein 03/26/2020 7.7  6.5 - 8.1  g/dL Final  . Albumin 47/82/956210/19/2021 4.1  3.5 - 5.0 g/dL Final  . AST 13/08/657810/19/2021 24  15 - 41 U/L Final  . ALT 03/26/2020 26  0 - 44 U/L Final  . Alkaline Phosphatase 03/26/2020 93  38 - 126 U/L Final  . Total Bilirubin 03/26/2020 1.1  0.3 - 1.2 mg/dL Final  . GFR, Estimated 03/26/2020 >60  >60 mL/min Final  . Anion gap 03/26/2020 12  5 - 15 Final   Performed at [facility], [address] [place], [place] [address]  . Cholesterol 03/26/2020 250* 0 - 200 mg/dL Final  . Triglycerides 03/26/2020 134  <150 mg/dL Final  . HDL 95/28/413210/19/2021 47  >40 mg/dL Final  . Total CHOL/HDL Ratio 03/26/2020 5.3  RATIO Final  . VLDL 03/26/2020 27  0 - 40 mg/dL Final  . LDL Cholesterol 03/26/2020 176* 0 - 99 mg/dL Final   Comment:        Total Cholesterol/HDL:CHD Risk Coronary Heart Disease Risk Table                     Men   Women  1/2 Average Risk   3.4   3.3  Average Risk       5.0   4.4  2 X Average Risk   9.6   7.1  3 X Average Risk  23.4   11.0        Use the calculated Patient Ratio above and the CHD Risk Table to determine the patient's CHD Risk.        ATP III CLASSIFICATION (LDL):  <100     mg/dL   Optimal  [id number]  mg/dL   Near or Above                    Optimal  130-159  mg/dL   Borderline  [id number]  mg/dL   High  >440>190     mg/dL   Very High Performed at [facility], [address] [place], [place] [address]   . TSH 03/26/2020 3.406  0.350 - 4.500 uIU/mL Final   Comment: Performed by a 3rd Generation assay with a functional sensitivity of <=0.01 uIU/mL. Performed at [facility], [address] [place], [place] [address]   . POC Amphetamine UR 03/25/2020 None Detected  None Detected Preliminary  . POC Secobarbital (BAR) 03/25/2020 None Detected  None Detected Preliminary  . POC Buprenorphine (BUP) 03/25/2020 None Detected  None Detected Preliminary  . POC Oxazepam (BZO) 03/25/2020 None Detected  None Detected Preliminary  . POC Cocaine UR 03/25/2020 None Detected   None Detected Preliminary  . POC Methamphetamine UR 03/25/2020 None Detected  None Detected Preliminary  . POC Morphine 03/25/2020 None Detected  None Detected Preliminary  . POC Oxycodone UR 03/25/2020 None Detected  None Detected Preliminary  . POC Methadone UR 03/25/2020 None Detected  None Detected Preliminary  . POC Marijuana UR 03/25/2020 None Detected  None Detected Preliminary  Allergies: Patient has no known allergies.  PTA Medications: (Not in a hospital admission)   Medical Decision Making  Admission labs ordered  Restart fluoxetine 20 mg daily for depression Restart Abilify 5 mg daily for AVH Restart Glucophage 500 mg BID for DM Restart lisinopril 5 mg daily for [facility] Hydroxyzine 25 mg TID prn for anxiety Trazodone 50 mg QHS prn for sleep  Clinical Course as of Mar 26 445  Tue Mar 26, 2020  0217 Glucose 160, CMP otherwise unremarkable.   Glucose(!): 160 [JB]  0217 CBC unremarkable  CBC with Differential/Platelet [JB]  0217 UDS negative  POCT Urine Drug Screen - (ICup) [JB]  0446 Cholesterol(!): 250 [JB]  0446 LDL (calc)(!): 176 [JB]    Clinical Course User Index [JB] Jackelyn Poling, NP    Recommendations  Based on my evaluation the patient does not appear to have an emergency medical condition.   Patient will be placed in the continuous assessment area at [facility] for treatment and stabilization. He will be reevaluated on 03/26/2020. The treatment team will determine disposition at that time.      Jackelyn Poling, NP 03/26/20  4:46 AM

## 2020-03-25 NOTE — ED Notes (Signed)
Patient belonging bag in locker # 3
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
## 2020-03-26 ENCOUNTER — Encounter (HOSPITAL_COMMUNITY): Payer: Self-pay

## 2020-03-26 ENCOUNTER — Other Ambulatory Visit: Payer: Self-pay

## 2020-03-26 LAB — POCT URINE DRUG SCREEN - MANUAL ENTRY (I-SCREEN)
POC Amphetamine UR: NOT DETECTED
POC Buprenorphine (BUP): NOT DETECTED
POC Cocaine UR: NOT DETECTED
POC Marijuana UR: NOT DETECTED
POC Methadone UR: NOT DETECTED
POC Methamphetamine UR: NOT DETECTED
POC Morphine: NOT DETECTED
POC Oxazepam (BZO): NOT DETECTED
POC Oxycodone UR: NOT DETECTED
POC Secobarbital (BAR): NOT DETECTED

## 2020-03-26 LAB — CBC WITH DIFFERENTIAL/PLATELET
Abs Immature Granulocytes: 0.02 10*3/uL (ref 0.00–0.07)
Basophils Absolute: 0 10*3/uL (ref 0.0–0.1)
Basophils Relative: 1 %
Eosinophils Absolute: 0.2 10*3/uL (ref 0.0–0.5)
Eosinophils Relative: 3 %
HCT: 47.7 % (ref 39.0–52.0)
Hemoglobin: 16 g/dL (ref 13.0–17.0)
Immature Granulocytes: 0 %
Lymphocytes Relative: 49 %
Lymphs Abs: 3.1 10*3/uL (ref 0.7–4.0)
MCH: 28.7 pg (ref 26.0–34.0)
MCHC: 33.5 g/dL (ref 30.0–36.0)
MCV: 85.5 fL (ref 80.0–100.0)
Monocytes Absolute: 0.4 10*3/uL (ref 0.1–1.0)
Monocytes Relative: 6 %
Neutro Abs: 2.6 10*3/uL (ref 1.7–7.7)
Neutrophils Relative %: 41 %
Platelets: 209 10*3/uL (ref 150–400)
RBC: 5.58 MIL/uL (ref 4.22–5.81)
RDW: 13.1 % (ref 11.5–15.5)
WBC: 6.3 10*3/uL (ref 4.0–10.5)
nRBC: 0 % (ref 0.0–0.2)

## 2020-03-26 LAB — COMPREHENSIVE METABOLIC PANEL
ALT: 26 U/L (ref 0–44)
AST: 24 U/L (ref 15–41)
Albumin: 4.1 g/dL (ref 3.5–5.0)
Alkaline Phosphatase: 93 U/L (ref 38–126)
Anion gap: 12 (ref 5–15)
BUN: 10 mg/dL (ref 6–20)
CO2: 22 mmol/L (ref 22–32)
Calcium: 9.7 mg/dL (ref 8.9–10.3)
Chloride: 103 mmol/L (ref 98–111)
Creatinine, Ser: 0.91 mg/dL (ref 0.61–1.24)
GFR, Estimated: >60 mL/min (ref >60)
Glucose, Bld: 160 mg/dL — ABNORMAL HIGH (ref 70–99)
Potassium: 3.6 mmol/L (ref 3.5–5.1)
Sodium: 137 mmol/L (ref 135–145)
Total Bilirubin: 1.1 mg/dL (ref 0.3–1.2)
Total Protein: 7.7 g/dL (ref 6.5–8.1)

## 2020-03-26 LAB — LIPID PANEL
Cholesterol: 250 mg/dL — ABNORMAL HIGH (ref 0–200)
HDL: 47 mg/dL (ref >40)
LDL Cholesterol: 176 mg/dL — ABNORMAL HIGH (ref 0–99)
Total CHOL/HDL Ratio: 5.3 RATIO
Triglycerides: 134 mg/dL (ref <150)
VLDL: 27 mg/dL (ref 0–40)

## 2020-03-26 LAB — TSH: TSH: 3.406 u[IU]/mL (ref 0.350–4.500)

## 2020-03-26 LAB — RESPIRATORY PANEL BY RT PCR (FLU A&B, COVID)
Influenza A by PCR: NEGATIVE
Influenza B by PCR: NEGATIVE
SARS Coronavirus 2 by RT PCR: NEGATIVE

## 2020-03-26 LAB — POC SARS CORONAVIRUS 2 AG -  ED: SARS Coronavirus 2 Ag: NEGATIVE

## 2020-03-26 MED ORDER — LISINOPRIL 5 MG PO TABS: 5.0000 mg | ORAL_TABLET | Freq: Every day | ORAL | 0 refills | Status: AC

## 2020-03-26 MED ORDER — ARIPIPRAZOLE 5 MG PO TABS: 5.0000 mg | ORAL_TABLET | Freq: Every day | ORAL | 0 refills | Status: AC

## 2020-03-26 MED ORDER — FLUOXETINE HCL 20 MG PO CAPS: 20.0000 mg | ORAL_CAPSULE | Freq: Every day | ORAL | 0 refills | Status: AC

## 2020-03-26 MED ORDER — HYDROXYZINE HCL 25 MG PO TABS: 25.0000 mg | ORAL_TABLET | Freq: Three times a day (TID) | ORAL | 0 refills | Status: AC | PRN

## 2020-03-26 MED ORDER — METFORMIN HCL 500 MG PO TABS
500.0000 mg | ORAL_TABLET | Freq: Two times a day (BID) | ORAL | 0 refills | Status: AC
Start: 2020-03-26 — End: ?

## 2020-03-26 MED ORDER — TRAZODONE HCL 50 MG PO TABS: 50.0000 mg | ORAL_TABLET | Freq: Every evening | ORAL | 0 refills | Status: AC | PRN

## 2020-03-26 NOTE — ED Notes (Addendum)
Nurse Discharge Note:  D:Patient denies SI/HI and A/V/H at this time. Pt appears calm and cooperative, and no distress noted.  A: All Personal items in locker returned to pt. Pt given AVS/Follow-Up/Prescriptions and prescription samples at discharge. Patient verbalized understanding of reviewed AVS. Written copies of prescriptions and AVS given to patient. Pt escorted to the lobby to catch bus home..  R:  Pt States he will comply with taking  medications as prescribed.

## 2020-03-26 NOTE — ED Triage Notes (Signed)
Pt arrives via [employer] with c/o SI. Pt A&Ox4, reports living in [place] and here to visit family and that multiple stressors r/o being here caused the SI. Pt denies plan or intent at this time. Pt states hx of VH of shadows, denies current. Denies AH & HI. Pt anxious with labs but cooperative with support of RN & MHT. Pt states not taking meds for mental health at this time. Ambulating independently, NAD. Shown to obs area.

## 2020-03-26 NOTE — ED Notes (Signed)
Patient given snack.  

## 2020-03-26 NOTE — ED Notes (Signed)
Patient is alert, and oriented. Patient denies SI/HI. Monitoring continues.

## 2020-03-26 NOTE — ED Provider Notes (Signed)
FBC/OBS ASAP Discharge Summary  Date and Time: 03/26/2020 9:55 AM  Name: Richard [name]  MRN:  [id number]   Discharge Diagnoses:  Final diagnoses:  Severe recurrent major depressive disorder with psychotic features (HCC)    Subjective: Patient reports today that he is doing a little bit better but that his situation at home is not changed.  He states that he was glad to be restarted on his medications but he needs to get out of the facility so that he can return to work so that he can get his car payment made so he can return to [facility].  Patient was offered a bus ticket to [employer] to return home.  Patient refused and stated "I have a 2016 [employer] I am not going to leave it here in [place] and go back to [place]."  Patient states that he would like to have his medications so that he can continue taking them and that he would like to be discharged so he can return to work because that was his plan initially.  He stated that a lot of his family do not seem to be very supportive of him when he needed some help and that is partly why he was here.  Patient denies any suicidal or homicidal ideations and denies any hallucinations.  Stay Summary: Patient is a 49 year old male with a history of MDD that presented to the [facility] via [place] [facility] due to worsening depression and suicidal ideations.  Patient reported he had returned to [facility] approximately a week ago to visit his children and foster mother.  He states that since he has been here he has realized that his family and friends are not supportive and realizes this is the reason he left [place].  Patient had not made his car payment of his vehicle and and has an automatic ignition showing officer he cannot drive his car into he makes the car payment.  Patient states that is not the only reason that he is in the hospital but it did play a significant part of it as he cannot leave the area until he gets his car paid.  Patient was transferred to  the [facility] for overnight observation and restarted on Prozac 20 mg p.o. daily, Abilify 5 mg p.o. daily, metformin 500 mg p.o. twice daily with meals, lisinopril 5 mg daily.  Patient remained overnight in the day and reported that he is not suicidal nor homicidal anymore and denies any hallucinations.  Patient states that he can still get back out and get a job and so he can make his car payment and return to [place].  Patient is provided with samples of his medications as well as 30-day prescriptions.  Patient is informed that if he stays in the area that he can return to this facility and be seen in outpatient at the [facility] at open access and he was provided with information of the open access.  Total Time spent with patient: 30 minutes  Past Psychiatric History: Anxiety depression Past Medical History:  Past Medical History:  Diagnosis Date  . Anxiety   . Depression   . Diabetes mellitus   . Diabetic neuropathy (HCC)   . Hypertension     Past Surgical History:  Procedure Laterality Date  . MOUTH SURGERY    . TYMPANOSTOMY TUBE PLACEMENT     Family History:  Family History  Problem Relation Age of Onset  . Diabetes Mother   . Suicidality Maternal Uncle    Family  Psychiatric History: Maternal uncle- committed suicide Social History:  Social History   Substance and Sexual Activity  Alcohol Use No   Comment: Patient denies      Social History   Substance and Sexual Activity  Drug Use Yes  . Frequency: 1.0 times per week  . Types: MDMA (Ecstacy)   Comment: Pt endorsed use on 08/08/16-- UDS was not positive    Social History   Socioeconomic History  . Marital status: Single    Spouse name: Not on file  . Number of children: Not on file  . Years of education: Not on file  . Highest education level: Not on file  Occupational History  . Not on file  Tobacco Use  . Smoking status: Current Every Day Smoker    Packs/day: 1.00    Years: 15.00    Pack years: 15.00    Types:  Cigarettes  . Smokeless tobacco: Never Used  Substance and Sexual Activity  . Alcohol use: No    Comment: Patient denies   . Drug use: Yes    Frequency: 1.0 times per week    Types: MDMA (Ecstacy)    Comment: Pt endorsed use on 08/08/16-- UDS was not positive  . Sexual activity: Never  Other Topics Concern  . Not on file  Social History Narrative  . Not on file   Social Determinants of Health   Financial Resource Strain:   . Difficulty of Paying Living Expenses: Not on file  Food Insecurity:   . Worried About Programme researcher, broadcasting/film/video in the Last Year: Not on file  . Ran Out of Food in the Last Year: Not on file  Transportation Needs:   . Lack of Transportation (Medical): Not on file  . Lack of Transportation (Non-Medical): Not on file  Physical Activity:   . Days of Exercise per Week: Not on file  . Minutes of Exercise per Session: Not on file  Stress:   . Feeling of Stress : Not on file  Social Connections:   . Frequency of Communication with Friends and Family: Not on file  . Frequency of Social Gatherings with Friends and Family: Not on file  . Attends Religious Services: Not on file  . Active Member of Clubs or Organizations: Not on file  . Attends Banker Meetings: Not on file  . Marital Status: Not on file   SDOH:  SDOH Screenings   Alcohol Screen:   . Last Alcohol Screening Score (AUDIT): Not on file  Depression (PHQ2-9):   . PHQ-2 Score: Not on file  Financial Resource Strain:   . Difficulty of Paying Living Expenses: Not on file  Food Insecurity:   . Worried About Programme researcher, broadcasting/film/video in the Last Year: Not on file  . Ran Out of Food in the Last Year: Not on file  Housing:   . Last Housing Risk Score: Not on file  Physical Activity:   . Days of Exercise per Week: Not on file  . Minutes of Exercise per Session: Not on file  Social Connections:   . Frequency of Communication with Friends and Family: Not on file  . Frequency of Social Gatherings with  Friends and Family: Not on file  . Attends Religious Services: Not on file  . Active Member of Clubs or Organizations: Not on file  . Attends Banker Meetings: Not on file  . Marital Status: Not on file  Stress:   . Feeling of Stress : Not  on file  Tobacco Use: High Risk  . Smoking Tobacco Use: Current Every Day Smoker  . Smokeless Tobacco Use: Never Used  Transportation Needs:   . Freight forwarderLack of Transportation (Medical): Not on file  . Lack of Transportation (Non-Medical): Not on file    Has this patient used any form of tobacco in the last 30 days? (Cigarettes, Smokeless Tobacco, Cigars, and/or Pipes) A prescription for an FDA-approved tobacco cessation medication was offered at discharge and the patient refused  Current Medications:  Current Facility-Administered Medications  Medication Dose Route Frequency Provider Last Rate Last Admin  . acetaminophen (TYLENOL) tablet 650 mg  650 mg Oral Q6H PRN Nira ConnBerry, Jason A, NP      . alum & mag hydroxide-simeth (MAALOX/MYLANTA) 200-200-20 MG/5ML suspension 30 mL  30 mL Oral Q4H PRN Nira ConnBerry, Jason A, NP      . ARIPiprazole (ABILIFY) tablet 5 mg  5 mg Oral Daily Nira ConnBerry, Jason A, NP   5 mg at 03/26/20 0905  . FLUoxetine (PROZAC) capsule 20 mg  20 mg Oral Daily Nira ConnBerry, Jason A, NP   20 mg at 03/26/20 0903  . hydrOXYzine (ATARAX/VISTARIL) tablet 25 mg  25 mg Oral TID PRN Nira ConnBerry, Jason A, NP      . lisinopril (ZESTRIL) tablet 5 mg  5 mg Oral Daily Nira ConnBerry, Jason A, NP   5 mg at 03/26/20 0917  . magnesium hydroxide (MILK OF MAGNESIA) suspension 30 mL  30 mL Oral Daily PRN Nira ConnBerry, Jason A, NP      . metFORMIN (GLUCOPHAGE) tablet 500 mg  500 mg Oral BID WC Nira ConnBerry, Jason A, NP   500 mg at 03/26/20 0904  . nicotine (NICODERM CQ - dosed in mg/24 hours) patch 21 mg  21 mg Transdermal Daily Nira ConnBerry, Jason A, NP      . traZODone (DESYREL) tablet 50 mg  50 mg Oral QHS PRN Jackelyn PolingBerry, Jason A, NP       Current Outpatient Medications  Medication Sig Dispense Refill  .  lisinopril (PRINIVIL,ZESTRIL) 5 MG tablet Take 1 tablet (5 mg total) by mouth daily. For high blood pressure 30 tablet 0  . metFORMIN (GLUCOPHAGE) 500 MG tablet Take 1 tablet (500 mg total) by mouth 2 (two) times daily with a meal. For diabetes management 60 tablet 0  . ARIPiprazole (ABILIFY) 5 MG tablet Take 1 tablet (5 mg total) by mouth daily. For mood control (Patient not taking: Reported on 03/26/2020) 30 tablet 0  . FLUoxetine (PROZAC) 20 MG capsule Take 1 capsule (20 mg total) by mouth daily. For depression (Patient not taking: Reported on 03/26/2020) 30 capsule 0  . hydrOXYzine (ATARAX/VISTARIL) 25 MG tablet Take 1 tablet (25 mg total) by mouth every 6 (six) hours as needed for anxiety. (Patient not taking: Reported on 03/26/2020) 60 tablet 0  . ibuprofen (ADVIL,MOTRIN) 600 MG tablet Take 1 tablet (600 mg total) by mouth every 6 (six) hours as needed. (Patient not taking: Reported on 03/26/2020) 30 tablet 0  . nicotine (NICODERM CQ - DOSED IN MG/24 HOURS) 21 mg/24hr patch Place 1 patch (21 mg total) onto the skin daily. For smoking cessation (Patient not taking: Reported on 03/26/2020) 28 patch 0  . oxyCODONE-acetaminophen (PERCOCET/ROXICET) 5-325 MG tablet Take 1 tablet by mouth every 4 (four) hours as needed for severe pain. (Patient not taking: Reported on 03/26/2020) 12 tablet 0  . traZODone (DESYREL) 50 MG tablet Take 1 tablet (50 mg total) by mouth at bedtime as needed for sleep. (Patient not taking:  Reported on 09/27/2016) 60 tablet 0    PTA Medications: (Not in a hospital admission)   Musculoskeletal  Strength & Muscle Tone: within normal limits Gait & Station: normal Patient leans: N/A  Psychiatric Specialty Exam  Presentation  General Appearance: Appropriate for Environment;Casual  Eye Contact:Good  Speech:Clear and Coherent;Normal Rate  Speech Volume:Normal  Handedness:Right   Mood and Affect  Mood:Euthymic  Affect:Appropriate;Congruent   Thought Process   Thought Processes:Coherent  Descriptions of Associations:Intact  Orientation:Full (Time, Place and Person)  Thought Content:WDL  Hallucinations:Hallucinations: None  Ideas of Reference:None  Suicidal Thoughts:Suicidal Thoughts: No SI Active Intent and/or Plan: With Intent;With Plan;With Means to Carry Out  Homicidal Thoughts:Homicidal Thoughts: No   Sensorium  Memory:Immediate Good;Recent Good;Remote Good  Judgment:Intact  Insight:Good   Executive Functions  Concentration:Good  Attention Span:Good  Recall:Good  Fund of Knowledge:Good  Language:Good   Psychomotor Activity  Psychomotor Activity:Psychomotor Activity: Normal   Assets  Assets:Desire for Improvement;Communication Skills;Social Support;Physical Health;Transportation   Sleep  Sleep:Sleep: Good   Physical Exam  Physical Exam Vitals and nursing note reviewed.  Constitutional:      Appearance: He is well-developed.  HENT:     Head: Normocephalic.  Eyes:     Pupils: Pupils are equal, round, and reactive to light.  Cardiovascular:     Rate and Rhythm: Normal rate.  Pulmonary:     Effort: Pulmonary effort is normal.  Musculoskeletal:        General: Normal range of motion.  Neurological:     Mental Status: He is alert and oriented to person, place, and time.    Review of Systems  Constitutional: Negative.   HENT: Negative.   Eyes: Negative.   Respiratory: Negative.   Cardiovascular: Negative.   Gastrointestinal: Negative.   Genitourinary: Negative.   Musculoskeletal: Negative.   Skin: Negative.   Neurological: Negative.   Endo/Heme/Allergies: Negative.   Psychiatric/Behavioral: Negative.    Blood pressure 107/70, pulse 76, temperature 97.7 F (36.5 C), temperature source Oral, resp. rate 18, SpO2 100 %. There is no height or weight on file to calculate BMI.  Demographic Factors:  Male and Low socioeconomic status  Loss Factors: Financial problems/change in socioeconomic  status  Historical Factors: NA  Risk Reduction Factors:   Sense of responsibility to family, Living with another person, especially a relative, Positive social support and Positive therapeutic relationship  Continued Clinical Symptoms:  Alcohol/Substance Abuse/Dependencies Previous Psychiatric Diagnoses and Treatments  Cognitive Features That Contribute To Risk:  None    Suicide Risk:  Minimal: No identifiable suicidal ideation.  Patients presenting with no risk factors but with morbid ruminations; may be classified as minimal risk based on the severity of the depressive symptoms  Plan Of Care/Follow-up recommendations:  Continue activity as tolerated. Continue diet as recommended by your PCP. Ensure to keep all appointments with outpatient providers.  Disposition: Discharge home  Maryfrances Bunnell, FNP 03/26/2020, 9:55 AM

## 2020-03-26 NOTE — ED Notes (Signed)
Given yogurt

## 2020-03-26 NOTE — Discharge Instructions (Addendum)

## 2020-03-27 LAB — HEMOGLOBIN A1C
Hgb A1c MFr Bld: 5.9 % — ABNORMAL HIGH (ref 4.8–5.6)
Mean Plasma Glucose: 123 mg/dL

## 2020-04-24 ENCOUNTER — Other Ambulatory Visit: Payer: Self-pay

## 2020-04-24 ENCOUNTER — Emergency Department (HOSPITAL_COMMUNITY)
Admission: EM | Admit: 2020-04-24 | Discharge: 2020-04-24 | Disposition: A | Discharge status: Home / Self Care | Payer: Self-pay | Attending: Emergency Medicine | Admitting: Emergency Medicine

## 2020-04-24 ENCOUNTER — Encounter (HOSPITAL_COMMUNITY): Payer: Self-pay | Admitting: Emergency Medicine

## 2020-04-24 ENCOUNTER — Emergency Department (HOSPITAL_COMMUNITY): Payer: Self-pay

## 2020-04-24 DIAGNOSIS — Z5321 Procedure and treatment not carried out due to patient leaving prior to being seen by health care provider: Secondary | ICD-10-CM | POA: Insufficient documentation

## 2020-04-24 DIAGNOSIS — R059 Cough, unspecified: Secondary | ICD-10-CM | POA: Insufficient documentation

## 2020-04-24 NOTE — ED Triage Notes (Signed)
Patient reports productive cough yesterday , respirations unlabored , no fever or chills .

## 2020-04-24 NOTE — ED Notes (Signed)
PT signed AMA form. Pt stated we were taking too long and he was ready to leave. Pt stated he did not want to see the doctor and was not staying. Note made, AMA form signed will remove pt from system.

## 2020-12-08 ENCOUNTER — Emergency Department (HOSPITAL_COMMUNITY)
Admission: EM | Admit: 2020-12-08 | Discharge: 2020-12-08 | Disposition: A | Discharge status: Home / Self Care | Payer: Self-pay | Attending: Emergency Medicine | Admitting: Emergency Medicine

## 2020-12-08 DIAGNOSIS — R600 Localized edema: Secondary | ICD-10-CM | POA: Insufficient documentation

## 2020-12-08 DIAGNOSIS — Z5321 Procedure and treatment not carried out due to patient leaving prior to being seen by health care provider: Secondary | ICD-10-CM | POA: Insufficient documentation

## 2020-12-08 NOTE — ED Notes (Signed)
No answer

## 2020-12-10 ENCOUNTER — Encounter (HOSPITAL_COMMUNITY): Payer: Self-pay

## 2020-12-10 ENCOUNTER — Other Ambulatory Visit: Payer: Self-pay

## 2020-12-10 ENCOUNTER — Emergency Department (HOSPITAL_COMMUNITY)
Admission: EM | Admit: 2020-12-10 | Discharge: 2020-12-10 | Disposition: A | Discharge status: Home / Self Care | Payer: Self-pay | Attending: Emergency Medicine | Admitting: Emergency Medicine

## 2020-12-10 DIAGNOSIS — R600 Localized edema: Secondary | ICD-10-CM | POA: Insufficient documentation

## 2020-12-10 DIAGNOSIS — Z5321 Procedure and treatment not carried out due to patient leaving prior to being seen by health care provider: Secondary | ICD-10-CM | POA: Insufficient documentation

## 2020-12-10 LAB — CBC
HCT: 47.4 % (ref 39.0–52.0)
Hemoglobin: 16.1 g/dL (ref 13.0–17.0)
MCH: 29.7 pg (ref 26.0–34.0)
MCHC: 34 g/dL (ref 30.0–36.0)
MCV: 87.5 fL (ref 80.0–100.0)
Platelets: 223 10*3/uL (ref 150–400)
RBC: 5.42 MIL/uL (ref 4.22–5.81)
RDW: 13.1 % (ref 11.5–15.5)
WBC: 6.6 10*3/uL (ref 4.0–10.5)
nRBC: 0 % (ref 0.0–0.2)

## 2020-12-10 LAB — BASIC METABOLIC PANEL
Anion gap: 4 — ABNORMAL LOW (ref 5–15)
BUN: 9 mg/dL (ref 6–20)
CO2: 27 mmol/L (ref 22–32)
Calcium: 8.9 mg/dL (ref 8.9–10.3)
Chloride: 108 mmol/L (ref 98–111)
Creatinine, Ser: 0.96 mg/dL (ref 0.61–1.24)
GFR, Estimated: >60 mL/min (ref >60)
Glucose, Bld: 168 mg/dL — ABNORMAL HIGH (ref 70–99)
Potassium: 3.8 mmol/L (ref 3.5–5.1)
Sodium: 139 mmol/L (ref 135–145)

## 2020-12-10 NOTE — ED Notes (Signed)
Pt approached this NT and asked "why do I have to wait when there isn't even that many people out here". This NT explained that we are waiting for a room to become available for him to go back to. Pt no longer seen in the waiting room.

## 2020-12-10 NOTE — ED Notes (Signed)
Pt still not seen inside or outside.

## 2020-12-10 NOTE — ED Triage Notes (Signed)
Pt reports bilateral leg swelling for the past 2-3 days, denies  SOB.

## 2023-04-17 ENCOUNTER — Emergency Department (HOSPITAL_COMMUNITY)
Admission: EM | Admit: 2023-04-17 | Discharge: 2023-04-17 | Discharge status: Against Medical Advice | Payer: Medicaid Other | Attending: Emergency Medicine | Admitting: Emergency Medicine

## 2023-04-17 ENCOUNTER — Other Ambulatory Visit: Payer: Self-pay

## 2023-04-17 DIAGNOSIS — R519 Headache, unspecified: Secondary | ICD-10-CM | POA: Insufficient documentation

## 2023-04-17 DIAGNOSIS — Z5321 Procedure and treatment not carried out due to patient leaving prior to being seen by health care provider: Secondary | ICD-10-CM | POA: Diagnosis not present

## 2023-04-17 DIAGNOSIS — K0889 Other specified disorders of teeth and supporting structures: Secondary | ICD-10-CM | POA: Insufficient documentation

## 2023-04-17 MED ORDER — ACETAMINOPHEN 325 MG PO TABS
650.0000 mg | ORAL_TABLET | Freq: Once | ORAL | Status: AC
  Administered 2023-04-17: 650 mg via ORAL
  Filled 2023-04-17: qty 2

## 2023-04-17 NOTE — ED Notes (Signed)
Pt said the wait was too long and was leaving.

## 2023-04-17 NOTE — ED Triage Notes (Signed)
Pt having right bottom tooth pain. Has not seen dentist. Going on since Friday. No bleeding no open sores. Pt does have headache and pain goes from mouth to ear. Taking OTC meds and helps a little

## 2023-04-18 ENCOUNTER — Telehealth (HOSPITAL_COMMUNITY): Payer: Self-pay

## 2023-04-18 ENCOUNTER — Ambulatory Visit (HOSPITAL_COMMUNITY)
Admission: EM | Admit: 2023-04-18 | Discharge: 2023-04-18 | Disposition: A | Discharge status: Home / Self Care | Payer: Medicaid Other | Attending: Emergency Medicine | Admitting: Emergency Medicine

## 2023-04-18 ENCOUNTER — Encounter (HOSPITAL_COMMUNITY): Payer: Self-pay

## 2023-04-18 DIAGNOSIS — K0889 Other specified disorders of teeth and supporting structures: Secondary | ICD-10-CM

## 2023-04-18 DIAGNOSIS — K029 Dental caries, unspecified: Secondary | ICD-10-CM

## 2023-04-18 MED ORDER — AMOXICILLIN-POT CLAVULANATE 875-125 MG PO TABS: 1.0000 | ORAL_TABLET | Freq: Two times a day (BID) | ORAL | 0 refills | Status: DC

## 2023-04-18 MED ORDER — AMOXICILLIN-POT CLAVULANATE 875-125 MG PO TABS: 1.0000 | ORAL_TABLET | Freq: Two times a day (BID) | ORAL | 0 refills | Status: AC

## 2023-04-18 MED ORDER — CHLORHEXIDINE GLUCONATE 0.12 % MT SOLN: 15.0000 mL | Freq: Three times a day (TID) | OROMUCOSAL | 0 refills | Status: DC

## 2023-04-18 MED ORDER — CHLORHEXIDINE GLUCONATE 0.12 % MT SOLN: 15.0000 mL | Freq: Three times a day (TID) | OROMUCOSAL | 0 refills | Status: AC

## 2023-04-18 NOTE — Discharge Instructions (Addendum)
Continue to take ibuprofen 800 mg with food as needed for pain.  Use as directed for pain relief Augmentin was prescribed/take as directed Chlorhexidine mouthwash was prescribed Recommend soft diet until evaluated by dentist Maintain oral hygiene care Follow up with dentist as soon as possible for further evaluation and treatment  Return or go to the ED if you have any new or worsening symptoms such as fever, chills, difficulty swallowing, painful swallowing, oral or neck swelling, nausea, vomiting, chest pain, SOB, etc..Marland Kitchen

## 2023-04-18 NOTE — ED Provider Notes (Signed)
[facility]   [id number] 04/18/23 Arrival Time: 1315  Chief Complaint  Patient presents with   Dental Pain     SUBJECTIVE:  Richard [name] is a 52 y.o. male who presents with dental pain for the past 3 days.  Denies a precipitating event or trauma.  Localizes pain to left lower gum.  Has tried OTC analgesics without relief.  Worse with chewing.  Does not see a dentist regularly and report his dental insurance is starting tomorrow.  Will put similar symptoms in the past.  Denies fever, chills, dysphagia, odynophagia, oral or neck swelling, nausea, vomiting, chest pain, SOB.    ROS: As per HPI.  All other pertinent ROS negative.     Past Medical History:  Diagnosis Date   Anxiety    Depression    Diabetes mellitus    Diabetic neuropathy (HCC)    Hypertension    Past Surgical History:  Procedure Laterality Date   MOUTH SURGERY     TYMPANOSTOMY TUBE PLACEMENT     No Known Allergies No current facility-administered medications on file prior to encounter.   Current Outpatient Medications on File Prior to Encounter  Medication Sig Dispense Refill   ARIPiprazole (ABILIFY) 5 MG tablet Take 1 tablet (5 mg total) by mouth daily. 30 tablet 0   FLUoxetine (PROZAC) 20 MG capsule Take 1 capsule (20 mg total) by mouth daily. 30 capsule 0   hydrOXYzine (ATARAX/VISTARIL) 25 MG tablet Take 1 tablet (25 mg total) by mouth 3 (three) times daily as needed for anxiety. 30 tablet 0   lisinopril (ZESTRIL) 5 MG tablet Take 1 tablet (5 mg total) by mouth daily. 30 tablet 0   metFORMIN (GLUCOPHAGE) 500 MG tablet Take 1 tablet (500 mg total) by mouth 2 (two) times daily with a meal. 60 tablet 0   traZODone (DESYREL) 50 MG tablet Take 1 tablet (50 mg total) by mouth at bedtime as needed for sleep. 30 tablet 0   Social History   Socioeconomic History   Marital status: Single    Spouse name: Not on file   Number of children: Not on file   Years of education: Not on file   Highest education  level: Not on file  Occupational History   Not on file  Tobacco Use   Smoking status: Every Day    Current packs/day: 1.00    Average packs/day: 1 pack/day for 15.0 years (15.0 ttl pk-yrs)    Types: Cigarettes   Smokeless tobacco: Never  Substance and Sexual Activity   Alcohol use: No    Comment: Patient denies    Drug use: Yes    Frequency: 1.0 times per week    Types: MDMA (Ecstacy)    Comment: Pt endorsed use on 08/08/16-- UDS was not positive   Sexual activity: Never  Other Topics Concern   Not on file  Social History Narrative   Not on file   Social Determinants of Health   Financial Resource Strain: Not on file  Food Insecurity: Not on file  Transportation Needs: Not on file  Physical Activity: Not on file  Stress: Not on file  Social Connections: Not on file  Intimate Partner Violence: Not on file   Family History  Problem Relation Age of Onset   Diabetes Mother    Suicidality Maternal Uncle     OBJECTIVE:  Vitals:   04/18/23 1324  BP: (!) 160/89  Pulse: 96  Resp: 18  Temp: 98.5 F (36.9 C)  TempSrc: Oral  SpO2: 98%  Weight: 231 lb 0.7 oz (104.8 kg)  Height: 5\' 8"  (1.727 m)    General appearance: alert; no distress HENT: normocephalic; atraumatic; dentition: poor with dental caries; inflamed over left lower gums without areas of fluctuance Neck: supple without LAD Lungs: normal respirations Skin: warm and dry Psychological: alert and cooperative; normal mood and affect  ASSESSMENT & PLAN:  1. Pain, dental   2. Dental caries     Meds ordered this encounter  Medications   amoxicillin-clavulanate (AUGMENTIN) 875-125 MG tablet    Sig: Take 1 tablet by mouth every 12 (twelve) hours.    Dispense:  14 tablet    Refill:  0   chlorhexidine (PERIDEX) 0.12 % solution    Sig: Use as directed 15 mLs in the mouth or throat in the morning, at noon, and at bedtime for 10 days.    Dispense:  450 mL    Refill:  0    Discharge instructions  Continue to  take ibuprofen 800 mg with food as needed for pain.  Use as directed for pain relief Augmentin was prescribed/take as directed Chlorhexidine mouthwash was prescribed Recommend soft diet until evaluated by dentist Maintain oral hygiene care Follow up with dentist as soon as possible for further evaluation and treatment  Return or go to the ED if you have any new or worsening symptoms such as fever, chills, difficulty swallowing, painful swallowing, oral or neck swelling, nausea, vomiting, chest pain, SOB, etc...  Reviewed expectations re: course of current medical issues. Questions answered. Outlined signs and symptoms indicating need for more acute intervention. Patient verbalized understanding. After Visit Summary given.    Durward Parcel, FNP 04/18/23 1343

## 2023-04-18 NOTE — ED Triage Notes (Signed)
Dental pain onset this past Friday morning. Pain and swelling in the lower left side of the face. No oral trauma.

## 2023-04-18 NOTE — Telephone Encounter (Signed)
Patient requesting medication from visit today, 04/18/23, be sent to the [facility] on [address]. Medication sent in to preferred pharmacy per protocol.
